# Patient Record
Sex: Male | Born: 1945 | Race: White | Hispanic: No | Marital: Married | Smoking: Former smoker
Health system: Southern US, Community
[De-identification: ages and names within clinical notes are randomized; demographics above are authoritative.]

## PROBLEM LIST (undated history)

## (undated) DIAGNOSIS — I371 Nonrheumatic pulmonary valve insufficiency: Secondary | ICD-10-CM

## (undated) DIAGNOSIS — Z951 Presence of aortocoronary bypass graft: Secondary | ICD-10-CM

## (undated) DIAGNOSIS — H02409 Unspecified ptosis of unspecified eyelid: Secondary | ICD-10-CM

## (undated) DIAGNOSIS — Z972 Presence of dental prosthetic device (complete) (partial): Secondary | ICD-10-CM

## (undated) DIAGNOSIS — N189 Chronic kidney disease, unspecified: Secondary | ICD-10-CM

## (undated) DIAGNOSIS — E119 Type 2 diabetes mellitus without complications: Secondary | ICD-10-CM

## (undated) DIAGNOSIS — IMO0001 Reserved for inherently not codable concepts without codable children: Secondary | ICD-10-CM

## (undated) DIAGNOSIS — I4891 Unspecified atrial fibrillation: Secondary | ICD-10-CM

## (undated) DIAGNOSIS — M0230 Reiter's disease, unspecified site: Secondary | ICD-10-CM

## (undated) DIAGNOSIS — I480 Paroxysmal atrial fibrillation: Secondary | ICD-10-CM

## (undated) DIAGNOSIS — I4510 Unspecified right bundle-branch block: Secondary | ICD-10-CM

## (undated) DIAGNOSIS — J189 Pneumonia, unspecified organism: Secondary | ICD-10-CM

## (undated) DIAGNOSIS — Z9289 Personal history of other medical treatment: Secondary | ICD-10-CM

## (undated) DIAGNOSIS — I071 Rheumatic tricuspid insufficiency: Secondary | ICD-10-CM

## (undated) DIAGNOSIS — K219 Gastro-esophageal reflux disease without esophagitis: Secondary | ICD-10-CM

## (undated) DIAGNOSIS — G4730 Sleep apnea, unspecified: Secondary | ICD-10-CM

## (undated) DIAGNOSIS — I10 Essential (primary) hypertension: Secondary | ICD-10-CM

## (undated) DIAGNOSIS — J449 Chronic obstructive pulmonary disease, unspecified: Secondary | ICD-10-CM

## (undated) DIAGNOSIS — I2510 Atherosclerotic heart disease of native coronary artery without angina pectoris: Secondary | ICD-10-CM

## (undated) DIAGNOSIS — E7800 Pure hypercholesterolemia, unspecified: Secondary | ICD-10-CM

## (undated) DIAGNOSIS — Z87442 Personal history of urinary calculi: Secondary | ICD-10-CM

## (undated) DIAGNOSIS — I499 Cardiac arrhythmia, unspecified: Secondary | ICD-10-CM

## (undated) DIAGNOSIS — G2581 Restless legs syndrome: Secondary | ICD-10-CM

## (undated) DIAGNOSIS — I340 Nonrheumatic mitral (valve) insufficiency: Secondary | ICD-10-CM

## (undated) DIAGNOSIS — G629 Polyneuropathy, unspecified: Secondary | ICD-10-CM

## (undated) HISTORY — PX: CHOLECYSTECTOMY: SHX55

## (undated) HISTORY — PX: CARDIAC SURGERY: SHX584

## (undated) HISTORY — PX: TOE SURGERY: SHX1073

## (undated) HISTORY — PX: OTHER SURGICAL HISTORY: SHX169

## (undated) HISTORY — PX: CARDIAC CATHETERIZATION: SHX172

## (undated) HISTORY — PX: HERNIA REPAIR: SHX51

## (undated) HISTORY — PX: CORONARY ARTERY BYPASS GRAFT: SHX141

---

## 2001-11-08 ENCOUNTER — Emergency Department (HOSPITAL_COMMUNITY): Admission: EM | Admit: 2001-11-08 | Discharge: 2001-11-08 | Payer: Self-pay | Admitting: Emergency Medicine

## 2004-01-30 HISTORY — PX: CORONARY ARTERY BYPASS GRAFT: SHX141

## 2004-12-11 ENCOUNTER — Ambulatory Visit: Payer: Self-pay | Admitting: Internal Medicine

## 2005-01-05 ENCOUNTER — Ambulatory Visit: Payer: Self-pay | Admitting: Internal Medicine

## 2005-01-05 ENCOUNTER — Inpatient Hospital Stay (HOSPITAL_COMMUNITY): Admission: AD | Admit: 2005-01-05 | Discharge: 2005-01-11 | Payer: Self-pay | Admitting: Surgery

## 2005-02-13 ENCOUNTER — Encounter: Payer: Self-pay | Admitting: Internal Medicine

## 2005-03-01 ENCOUNTER — Encounter: Payer: Self-pay | Admitting: Internal Medicine

## 2005-03-29 ENCOUNTER — Encounter: Payer: Self-pay | Admitting: Internal Medicine

## 2006-06-03 ENCOUNTER — Ambulatory Visit: Payer: Self-pay | Admitting: Unknown Physician Specialty

## 2008-08-19 ENCOUNTER — Ambulatory Visit: Payer: Self-pay | Admitting: Internal Medicine

## 2008-08-23 ENCOUNTER — Ambulatory Visit: Payer: Self-pay | Admitting: Internal Medicine

## 2008-08-25 ENCOUNTER — Ambulatory Visit: Payer: Self-pay | Admitting: Internal Medicine

## 2008-09-02 ENCOUNTER — Ambulatory Visit: Payer: Self-pay | Admitting: Internal Medicine

## 2009-10-11 ENCOUNTER — Encounter: Payer: Self-pay | Admitting: Internal Medicine

## 2009-10-29 ENCOUNTER — Encounter: Payer: Self-pay | Admitting: Internal Medicine

## 2009-11-25 ENCOUNTER — Inpatient Hospital Stay: Payer: Self-pay | Admitting: Internal Medicine

## 2009-11-29 ENCOUNTER — Encounter: Payer: Self-pay | Admitting: Internal Medicine

## 2009-12-29 ENCOUNTER — Encounter: Payer: Self-pay | Admitting: Internal Medicine

## 2010-01-29 ENCOUNTER — Encounter: Payer: Self-pay | Admitting: Internal Medicine

## 2010-03-01 ENCOUNTER — Encounter: Payer: Self-pay | Admitting: Internal Medicine

## 2010-09-20 ENCOUNTER — Ambulatory Visit: Payer: Self-pay | Admitting: Internal Medicine

## 2011-03-01 ENCOUNTER — Ambulatory Visit: Payer: Self-pay | Admitting: Urology

## 2011-11-12 ENCOUNTER — Ambulatory Visit: Payer: Self-pay | Admitting: Unknown Physician Specialty

## 2012-07-15 ENCOUNTER — Ambulatory Visit: Payer: Self-pay | Admitting: Specialist

## 2012-07-26 ENCOUNTER — Emergency Department: Payer: Self-pay | Admitting: Emergency Medicine

## 2012-07-26 LAB — COMPREHENSIVE METABOLIC PANEL
Albumin: 4.5 g/dL (ref 3.4–5.0)
Bilirubin,Total: 0.6 mg/dL (ref 0.2–1.0)
Calcium, Total: 8.8 mg/dL (ref 8.5–10.1)
Chloride: 104 mmol/L (ref 98–107)
Creatinine: 1 mg/dL (ref 0.60–1.30)
EGFR (Non-African Amer.): >60
SGPT (ALT): 56 U/L (ref 12–78)
Sodium: 138 mmol/L (ref 136–145)

## 2012-07-26 LAB — URINALYSIS, COMPLETE
Glucose,UR: NEGATIVE mg/dL (ref 0–75)
Leukocyte Esterase: NEGATIVE
Nitrite: NEGATIVE
Ph: 5 (ref 4.5–8.0)
Specific Gravity: 1.02 (ref 1.003–1.030)
Squamous Epithelial: NONE SEEN
WBC UR: 2 /HPF (ref 0–5)

## 2012-07-26 LAB — CBC
MCH: 32.9 pg (ref 26.0–34.0)
Platelet: 132 10*3/uL — ABNORMAL LOW (ref 150–440)

## 2012-07-26 IMAGING — CR DG ABDOMEN 1V
1 series · 2 of 2 positions shown · non-contrast
Comparison: none

REASON FOR EXAM: side pain
COMMENTS:

[Series 1: t abdomen supine · 0.14mm/px · 2 of 2 slices shown]
[im 1/2]
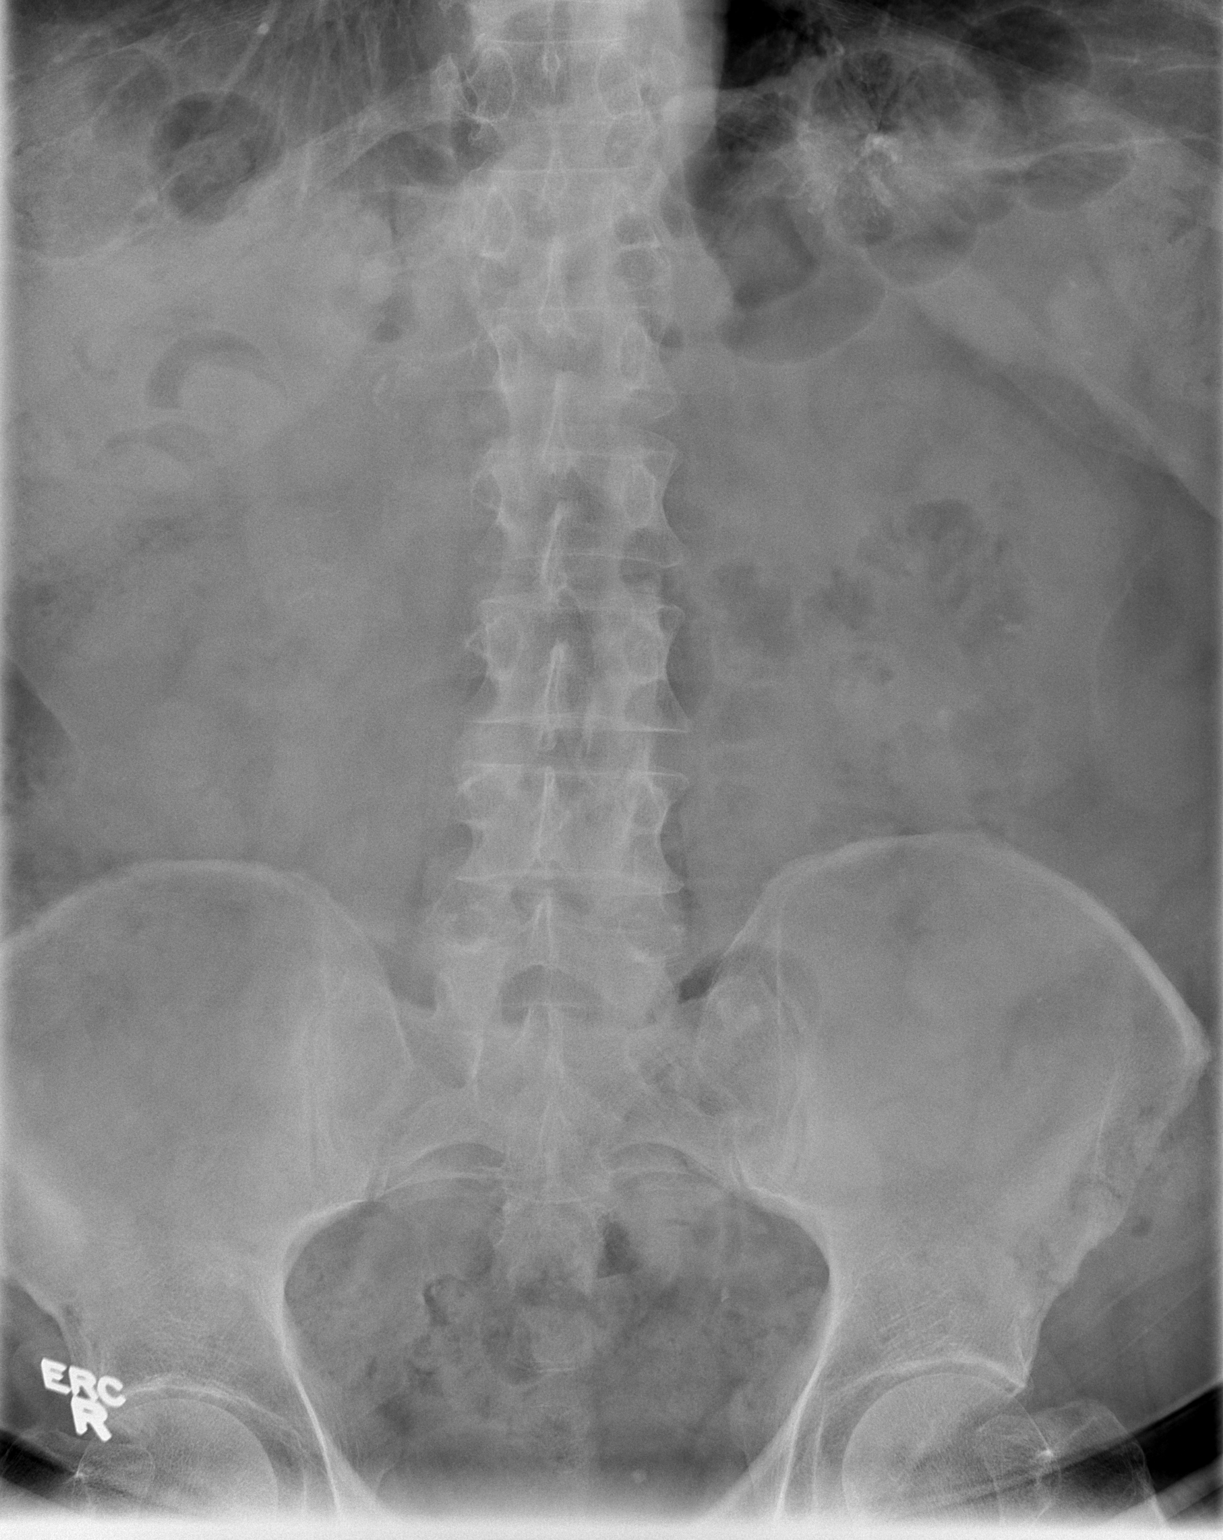
[im 2/2]
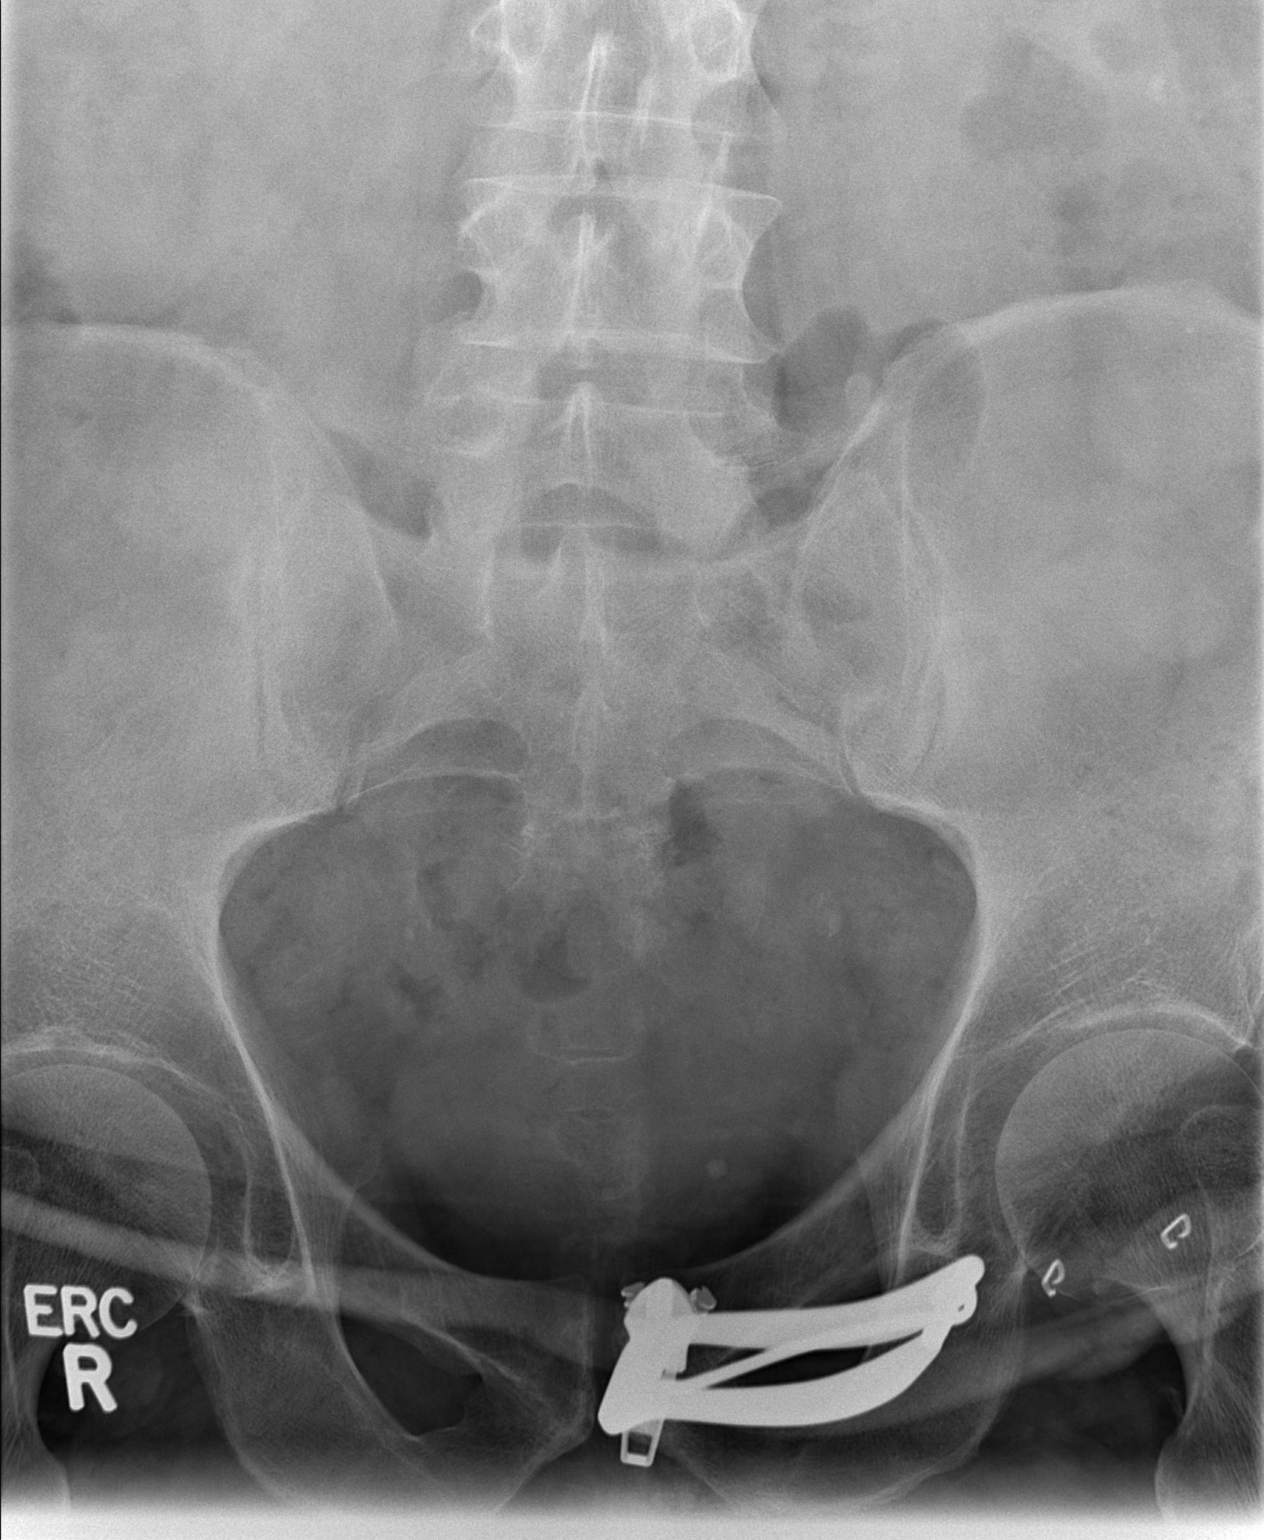

[2 of 2 positions shown; findings below may reference images not displayed]

PROCEDURE:     DXR - DXR KIDNEY URETER BLADDER  - [DATE]  [DATE]

RESULT:     The bowel gas pattern is within the limits of normal. There are
faint calcifications which project over both kidneys. The lumbar spine is
normal in appearance where visualized. The bowel gas pattern is within the
limits of normal.
IMPRESSION: There are faint calcifications which project over the
kidneys bilaterally which may reflect stones.

[REDACTED]

## 2012-12-30 ENCOUNTER — Encounter: Payer: Self-pay | Admitting: Internal Medicine

## 2013-01-29 ENCOUNTER — Encounter: Payer: Self-pay | Admitting: Internal Medicine

## 2013-03-01 ENCOUNTER — Encounter: Payer: Self-pay | Admitting: Internal Medicine

## 2013-03-29 ENCOUNTER — Encounter: Payer: Self-pay | Admitting: Internal Medicine

## 2013-04-29 ENCOUNTER — Encounter: Payer: Self-pay | Admitting: Internal Medicine

## 2013-05-29 ENCOUNTER — Encounter: Payer: Self-pay | Admitting: Internal Medicine

## 2013-06-29 ENCOUNTER — Encounter: Payer: Self-pay | Admitting: Internal Medicine

## 2013-12-27 ENCOUNTER — Emergency Department: Payer: Self-pay | Admitting: Emergency Medicine

## 2013-12-30 ENCOUNTER — Ambulatory Visit: Payer: Self-pay | Admitting: Nurse Practitioner

## 2013-12-31 DIAGNOSIS — G4733 Obstructive sleep apnea (adult) (pediatric): Secondary | ICD-10-CM | POA: Insufficient documentation

## 2013-12-31 DIAGNOSIS — I480 Paroxysmal atrial fibrillation: Secondary | ICD-10-CM | POA: Insufficient documentation

## 2013-12-31 DIAGNOSIS — I10 Essential (primary) hypertension: Secondary | ICD-10-CM | POA: Insufficient documentation

## 2013-12-31 DIAGNOSIS — E782 Mixed hyperlipidemia: Secondary | ICD-10-CM | POA: Insufficient documentation

## 2014-01-25 ENCOUNTER — Inpatient Hospital Stay: Payer: Self-pay | Admitting: Surgery

## 2014-01-25 LAB — URINALYSIS, COMPLETE
Bacteria: NONE SEEN
Bilirubin,UR: NEGATIVE
KETONE: NEGATIVE
LEUKOCYTE ESTERASE: NEGATIVE
NITRITE: NEGATIVE
Ph: 6 (ref 4.5–8.0)
Protein: NEGATIVE
RBC,UR: 165 /HPF (ref 0–5)
Specific Gravity: 1.016 (ref 1.003–1.030)
Squamous Epithelial: NONE SEEN
WBC UR: 2 /HPF (ref 0–5)

## 2014-01-25 LAB — COMPREHENSIVE METABOLIC PANEL
ANION GAP: 9 (ref 7–16)
Albumin: 4 g/dL (ref 3.4–5.0)
Alkaline Phosphatase: 113 U/L
BUN: 17 mg/dL (ref 7–18)
Bilirubin,Total: 0.6 mg/dL (ref 0.2–1.0)
CO2: 25 mmol/L (ref 21–32)
Calcium, Total: 8.4 mg/dL — ABNORMAL LOW (ref 8.5–10.1)
Chloride: 103 mmol/L (ref 98–107)
Creatinine: 0.87 mg/dL (ref 0.60–1.30)
EGFR (African American): >60
EGFR (Non-African Amer.): >60
Glucose: 224 mg/dL — ABNORMAL HIGH (ref 65–99)
OSMOLALITY: 282 (ref 275–301)
Potassium: 4.2 mmol/L (ref 3.5–5.1)
SGOT(AST): 46 U/L — ABNORMAL HIGH (ref 15–37)
SGPT (ALT): 34 U/L
Sodium: 137 mmol/L (ref 136–145)
Total Protein: 7.2 g/dL (ref 6.4–8.2)

## 2014-01-25 LAB — CBC WITH DIFFERENTIAL/PLATELET
Basophil #: 0.1 10*3/uL (ref 0.0–0.1)
Basophil %: 0.7 %
EOS ABS: 0 10*3/uL (ref 0.0–0.7)
EOS PCT: 0.5 %
HCT: 40 % (ref 40.0–52.0)
HGB: 13.8 g/dL (ref 13.0–18.0)
LYMPHS ABS: 1.1 10*3/uL (ref 1.0–3.6)
LYMPHS PCT: 13.4 %
MCH: 31.9 pg (ref 26.0–34.0)
MCHC: 34.4 g/dL (ref 32.0–36.0)
MCV: 93 fL (ref 80–100)
Monocyte #: 0.5 x10 3/mm (ref 0.2–1.0)
Monocyte %: 6.3 %
NEUTROS ABS: 6.4 10*3/uL (ref 1.4–6.5)
Neutrophil %: 79.1 %
Platelet: 154 10*3/uL (ref 150–440)
RBC: 4.31 10*6/uL — AB (ref 4.40–5.90)
RDW: 13.3 % (ref 11.5–14.5)
WBC: 8.1 10*3/uL (ref 3.8–10.6)

## 2014-01-25 LAB — LIPASE, BLOOD: LIPASE: 94 U/L (ref 73–393)

## 2014-01-28 ENCOUNTER — Ambulatory Visit: Payer: Self-pay | Admitting: Surgery

## 2014-01-28 DIAGNOSIS — I4891 Unspecified atrial fibrillation: Secondary | ICD-10-CM

## 2014-01-28 LAB — POTASSIUM: POTASSIUM: 4.7 mmol/L (ref 3.5–5.1)

## 2014-01-28 LAB — MAGNESIUM: Magnesium: 1.8 mg/dL

## 2014-01-29 LAB — CBC WITH DIFFERENTIAL/PLATELET
Basophil #: 0 x10 3/mm 3
Basophil %: 0.5 %
Eosinophil #: 0 x10 3/mm 3
Eosinophil %: 0.6 %
HCT: 33.5 % — ABNORMAL LOW
HGB: 11.8 g/dL — ABNORMAL LOW
Lymphocyte %: 6.1 %
Lymphs Abs: 0.5 x10 3/mm 3 — ABNORMAL LOW
MCH: 31.9 pg
MCHC: 35.2 g/dL
MCV: 91 fL
Monocyte #: 0.8 "x10 3/mm "
Monocyte %: 9.6 %
Neutrophil #: 6.5 x10 3/mm 3
Neutrophil %: 83.2 %
Platelet: 143 x10 3/mm 3 — ABNORMAL LOW
RBC: 3.7 x10 6/mm 3 — ABNORMAL LOW
RDW: 13.4 %
WBC: 7.8 x10 3/mm 3

## 2014-05-22 NOTE — Consult Note (Signed)
PATIENT NAME:  Zachary Guerrero, Zachary Guerrero MR#:  [id number] DATE OF BIRTH:  12-May-1945  DATE OF CONSULTATION:  01/25/2014  REFERRING PHYSICIAN:   CONSULTING PHYSICIAN:  Refoel Palladino A. Allena Katz, MD  PRIMARY CARE PHYSICIAN:  Dr. Aram Beecham.   PRIMARY CARDIOLOGIST:  Dr. [name].   REFERRING PHYSICIAN: Dr. Juliann Pulse.   CONSULTING PHYSICIAN:  Dr. Enedina Finner.    REASON FOR CONSULTATION:  Medical management, preoperative clearance for possible laparoscopic cholecystectomy. The patient has history of CAD.   HOSPITAL COURSE: Zachary Guerrero is a 69 year old obese Caucasian gentleman with past medical history of diabetes, COPD, hypercholesterolemia, history of CAD status post cardiac bypass in the remote past, along with history of chronic atrial fibrillation on Pradaxa for several years. He comes to the Emergency Room after he started having abdominal pain and not feeling well. Evaluation in the Emergency Room showed the patient has multiple gallstones. The patient is admitted on the surgical service, internal medicine was consulted for preoperative clearance. The patient denies any chest pain. Does have some chronic shortness of breath, however does not use oxygen. Denies any anginal chest pain. He was recently seen by Dr. [name]  and was cleared for his left shoulder fracture surgery in case it was needed. This happened the last week of November 2015.   In the Emergency Room the patient is in sinus rhythm. EKG shows normal sinus rhythm with right bundle branch block.   PAST MEDICAL HISTORY:  1.  Hypercholesterolemia.  2.  COPD.   3.  Morbid obesity with obstructive sleep apnea on CPAP.  4.  Atrial fibrillation, chronic on Pradaxa.   5.  Hypertension.  6.  Coronary artery disease status post CABG.  7.  Right wrist surgery.  8.  Right ankle surgery.  9.  CABG in the remote past.   ALLERGIES: No known drug allergies.   MEDICATIONS:  1. Zofran ODT 1 tablet every 8 hours as needed.  2. Tikosyn 125 mcg 3  tablets 2 times a day.  3. Symbicort 150/4.5 two puffs b.i.d.  4. Spiriva 18 micrograms inhalation daily.  5. ProAir HFA 2 puffs 4 times a day.  6. Pradaxa 150 mg b.i.d.  7. Percocet 5/325 one to two q. 4 p.r.n.  8. Omeprazole 20 mg daily.  9. Metoprolol 25 mg p.o. daily.  10. Metformin 1000 mg b.i.d.  11. Magnesium 400 mg b.i.d.  12. Lovastatin 40 mg daily at bedtime.  13. Linagliptin 5 mg daily.  14. Lantus 10 units subcutaneous daily.  15. Glimepiride 2 mg 1 tablet p.o. daily.  16. Augmentin 1 tablet b.i.d.    REVIEW OF SYSTEMS:   CONSTITUTIONAL: No fever, fatigue, weakness.  EYES: No blurred or double vision, glaucoma, or cataract.  EARS, NOSE, THROAT: No tinnitus, ear pain, hearing loss.  RESPIRATORY: No cough, wheeze, hemoptysis. Positive for on and off shortness of breath with exertion.  CARDIOVASCULAR: No chest pain, orthopnea. Positive for chronic atrial fibrillation currently in sinus rhythm. No palpitations.  GASTROINTESTINAL: No nausea, vomiting, diarrhea. Positive for abdominal pain, right upper quadrant and possible GERD.  GENITOURINARY:  No dysuria, hematuria, frequency.  ENDOCRINE: No polyuria, nocturia, or thyroid problems.  HEMATOLOGY: No anemia, easy bruising.  SKIN: No acne or rash.  MUSCULOSKELETAL: Positive for arthritis. No gout or swelling.  NEUROLOGIC:  No CVA, TIA, or migraines.  PSYCHIATRIC: No anxiety or depression.   All other systems reviewed and negative.   PHYSICAL EXAMINATION:  GENERAL: The patient is awake, alert, oriented x3, not in acute distress.  Morbidly obese.   VITAL SIGNS: Afebrile. Pulse is 80, regular rhythm, blood pressure is 147/70, saturations are 95% on room air.  HEENT: Atraumatic, normocephalic. Pupils PERRLA.  EOM intact. Oral mucosa is moist.  NECK: Supple. No JVD. No carotid bruit.  LUNGS: Clear to auscultation bilaterally. No rales, rhonchi, respiratory distress, or labored breathing.  CARDIOVASCULAR: Both the heart sounds  are normal. No murmur heard. PMI not lateralized. Chest nontender.  EXTREMITIES: Good pedal pulses, good femoral pulses. 2 + pitting edema.  ABDOMEN:  Obese, soft. There is some tenderness present in the right flank and right upper quadrant. No guarding or rigidity. No mass felt.  NEUROLOGIC: Grossly intact cranial nerves II through XII. No motor or sensory deficit. SKIN: Warm and dry.  PSYCHIATRIC: The patient is awake, alert, oriented x 3.   LABORATORY DATA: EKG shows right bundle branch block with normal sinus rhythm.   UA negative for UTI.    Ultrasound of the abdomen shows distended gallbladder containing layering of mildly echogenic debris without posterior shadowing, debris is mobile, most likely consistent with gallbladder sludge or tiny stones, no evidence of cholecystitis.    White count is 8.3, H and H is 13.8 and 40.3. BUN is 17, creatinine is 0.8, sodium is 137, potassium is 4.2. Bilirubin is 0.6. Alkaline phosphatase 113, SGOT is 46, albumin is 4.0.  Lipase is 894.    EKG shows normal sinus rhythm with right bundle branch block.   ASSESSMENT: A 69 year old, Zachary Guerrero, with history of chronic atrial fibrillation, coronary artery disease, diabetes, comes in with:  1.  Abdominal pain right upper quadrant, suspected due to gallstones. The patient was seen by Dr. Juliann PulseLundquist and recommended internal medicine consult for possible preoperative for possible removal of gallbladder. The patient is at intermediate risk given his multiple medical problems including cardiac history, although he is not actively experiencing any issues from his cardiac standpoint. He is okay for surgery at this time.  2. History of chronic atrial fibrillation on Peridex. The patient will hold off Pradaxa. His EKG shows normal sinus rhythm with right bundle branch block. We will hold off on IV heparin drip as well. Pradaxa should be resumed after surgery when clinically feasible per surgery.  3.  History of  coronary artery disease. We will hold off on aspirin, however continue the rest of his cardiac medications.  4.  Type 2 diabetes. Continue home medications including insulin and p.o. diabetic medications. 5.  Deep vein thrombosis prophylaxis. Heparin subcutaneous t.i.d.  6.  Hypertension. Continue metoprolol.  7.  Chronic obstructive pulmonary disease. Continue his nebulizers and home inhalers.  8.  Obstructive sleep apnea. Continue CPAP.   Thank you for the consult. We will follow while the patient is in house.   TIME SPENT: 50 minutes.    ____________________________ Wylie HailSona A. Allena KatzPatel, MD sap:bu D: 01/25/2014 15:47:40 ET T: 01/25/2014 16:23:52 ET JOB#: [id number]  cc: Aadhira Heffernan A. Allena KatzPatel, MD, <Dictator> Duane LopeJeffrey D. Judithann SheenSparks, MD Lamar BlinksBruce J. Kowalski, MD Si Raiderhristopher A. Juliann PulseLundquist, MD  Willow OraSONA A Daanish Copes MD ELECTRONICALLY SIGNED 01/26/2014 18:42

## 2014-05-24 LAB — SURGICAL PATHOLOGY

## 2014-05-26 NOTE — Consult Note (Signed)
PATIENT NAME:  Zachary Guerrero, Ramesh R MR#:  [id number] DATE OF BIRTH:  01-30-1946  DATE OF CONSULTATION:  01/25/2014  REFERRING PHYSICIAN:   CONSULTING PHYSICIAN:  Cristal Deerhristopher A. Jayvien Rowlette, MD  REASON FOR CONSULTATION: Right upper quadrant pain; small gallstones.   HISTORY OF PRESENT ILLNESS: Mr. Zachary Guerrero is a pleasant 69 year old male with history of recurrent gallstone disease. He presents with right upper quadrant pain after eating fatty foods. It is in his right flank; currently not tender. He did have some pain after trying to resume p.o. and had an ultrasound which shows gallstones, but says that his pain is similar to gallstone attacks that he has had in the past, and very many of them. In addition, he had a urinalysis which was positive for 2+ red blood cells.   PAST MEDICAL HISTORY: History of recurrent kidney stones, coronary artery disease, diabetes, COPD, hypercholesterolemia, AFib hypertension, coronary artery bypass graft, history of a right arm fracture, wrist surgery right, ankle surgery right, cardiac surgery.   MEDICATIONS: Tikosyn 125 mcg 3 tablets t.i.d., Symbicort 2 puffs b.i.d., Spiriva 1 daily, ProAir HFA 2 puffs q.i.d., Pradaxa 150 b.i.d., Percocet 1 tablet p.o. q. 6 hours p.r.n. pain. Omeprazole 20 mg p.o. daily, metoprolol 25 p.o. daily, metformin 1000 mg p.o. b.i.d. magnesium, lovastatin 1 tablet p.o. daily, linagliptin 5 mg p.o. daily, Lantus 10 units subcutaneous at bedtime, glimepiride  2 p.o. daily.   ALLERGIES: No known drug allergies.   FAMILY HISTORY: Unremarkable.   REVIEW OF SYSTEMS: A 12-point review of systems was  obtained. Pertinent positives and negatives as above.   PHYSICAL EXAMINATION:  VITAL SIGNS: Temperature 97.5, pulse 86, blood pressure 144/75, respirations 17.  GENERAL: No acute distress. Alert and oriented x 3.  HEAD: Normocephalic, atraumatic.  EYES: No scleral icterus. No conjunctivitis.  FACE: No evidence of facial trauma. Normal external nose.  Normal external ears.  CHEST: Lungs clear to auscultation. Moving air well.  HEART: Regular rate and rhythm. No murmurs, rubs, or gallops.  ABDOMEN: Soft, nondistended, nontender.  EXTREMITIES: Moves all extremities well. Strength 5/5.   LABORATORY DATA: White cell count of 8.1. LFTs otherwise unremarkable. Ultrasound shows small stones versus sludge. No thickening of gallbladder wall. No pericholecystic fluid. Common bile duct 4.6.   ASSESSMENT AND PLAN: Mr. Zachary Guerrero is a pleasant 69 year old with a history of kidney stones with right upper quadrant flank pain. Does have gallstones; however, does not have signs or symptoms of cholecystitis. Does have blood in his urine. Would entertain kidney stones versus symptomatic cholelithiasis versus  peptic ulcer disease. No need for emergent surgical intervention at this time. Have offered cholecystectomy, but have  offered waiting as well and we would prefer to do that. He has my contact information, in case his pain persists, so we can talk about cholecystectomy. We will continue to follow.    ____________________________ Si Raiderhristopher A. Emmett Arntz, MD cal:MT D: 01/25/2014 12:19:24 ET T: 01/25/2014 12:44:26 ET JOB#: [id number]  cc: Cristal Deerhristopher A. Yomayra Tate, MD, <Dictator> Jarvis NewcomerHRISTOPHER A Fran Mcree MD ELECTRONICALLY SIGNED 02/09/2014 19:46

## 2014-05-30 NOTE — Op Note (Signed)
PATIENT NAME:  Zachary Guerrero, Ossie R MR#:  [id number] DATE OF BIRTH:  1945/03/29  DATE OF PROCEDURE:  01/28/2014  PREOPERATIVE DIAGNOSIS: Acute cholecystitis.   POSTOPERATIVE DIAGNOSIS: Acute cholecystitis.   PROCEDURE PERFORMED: Laparoscopic cholecystectomy.   SURGEON: Lacy Sofia A. Andriy Sherk, M.D.   ANESTHESIA: General.   ESTIMATED BLOOD LOSS: 100 mL.   COMPLICATIONS: None.   SPECIMEN: Gallbladder.   INDICATION FOR SURGERY: Mr. [name] is a pleasant gentleman with a history of right upper quadrant pain, who I had seen earlier in the week. He was on a blood thinning medication and had an ultrasound which was suspicious for acute cholecystitis. I had initially advised him to stay but he wanted to go home and the decision was made for outpatient laparoscopic cholecystectomy.   DETAILS OF THE PROCEDURE AS FOLLOWS: Informed consent was obtained. Mr. [name] was brought to the operating room suite. He was induced, endotracheal tube was placed. General anesthesia was administered. His abdomen was prepped and draped in standard surgical fashion. A timeout was then performed correctly identifying the patient name, operative site and procedure to be performed. A supraumbilical incision was made. It was deepened down to the fascia. The fascia was incised. The peritoneum was entered. Two stay sutures were placed through the fasciotomy. A Hasson trocar was placed in the abdomen. The abdomen was insufflated. An 11 mm epigastric and 2 right subcostal trocars were placed. The gallbladder was lifted over the dome of the liver. It was extremely inflamed. There were a lot of adhesions to it. These were peeled off with significant bleeding. The cystic duct and the cystic artery were dissected out. The critical view was obtained. The duct and artery were clipped, although was taken off the gallbladder fossa and brought out through an Endo Catch bag. Hemostasis was obtained, but a drain was placed due to significant  blood loss and the fact that he was on anticoagulation.   The abdomen was then desufflated. The supraumbilical fascia was closed with figure-of-eight 0 Vicryl. The skin was then closed with 4-0 Monocryl deep dermal sutures. Steri-Strips, Telfa gauze and Tegaderm were placed over the wounds. The drain was sutured in place with a 3-0 nylon suture. Steri-Strips, Telfa gauze and Tegaderm were used to complete the dressing. The patient was then awoken, extubated and brought to the postanesthesia care unit. There were no immediate complications. Needle, sponge, and instrument counts were correct at the end of the procedure.     ____________________________ Si Raiderhristopher A. Tashari Schoenfelder, MD cal:JT D: 02/03/2014 08:51:00 ET T: 02/03/2014 13:23:23 ET JOB#: [id number]  cc: Cristal Deerhristopher A. Keval Nam, MD, <Dictator> Jarvis NewcomerHRISTOPHER A Jataya Wann MD ELECTRONICALLY SIGNED 02/09/2014 19:52

## 2014-10-31 ENCOUNTER — Emergency Department
Admission: EM | Admit: 2014-10-31 | Discharge: 2014-10-31 | Disposition: A | Discharge status: Home / Self Care | Payer: Medicare PPO | Attending: Emergency Medicine | Admitting: Emergency Medicine

## 2014-10-31 ENCOUNTER — Emergency Department: Payer: Medicare PPO

## 2014-10-31 DIAGNOSIS — N200 Calculus of kidney: Secondary | ICD-10-CM | POA: Diagnosis not present

## 2014-10-31 DIAGNOSIS — R109 Unspecified abdominal pain: Secondary | ICD-10-CM

## 2014-10-31 DIAGNOSIS — H938X1 Other specified disorders of right ear: Secondary | ICD-10-CM | POA: Insufficient documentation

## 2014-10-31 LAB — CBC
HEMATOCRIT: 43.9 % (ref 40.0–52.0)
HEMOGLOBIN: 15.6 g/dL (ref 13.0–18.0)
MCH: 33.2 pg (ref 26.0–34.0)
MCHC: 35.7 g/dL (ref 32.0–36.0)
MCV: 93.2 fL (ref 80.0–100.0)
Platelets: 136 10*3/uL — ABNORMAL LOW (ref 150–440)
RBC: 4.71 MIL/uL (ref 4.40–5.90)
RDW: 14.6 % — ABNORMAL HIGH (ref 11.5–14.5)
WBC: 6.8 10*3/uL (ref 3.8–10.6)

## 2014-10-31 LAB — COMPREHENSIVE METABOLIC PANEL
ALBUMIN: 4.8 g/dL (ref 3.5–5.0)
ALK PHOS: 85 U/L (ref 38–126)
ALT: 34 U/L (ref 17–63)
ANION GAP: 12 (ref 5–15)
AST: 51 U/L — ABNORMAL HIGH (ref 15–41)
BILIRUBIN TOTAL: 0.9 mg/dL (ref 0.3–1.2)
BUN: 22 mg/dL — ABNORMAL HIGH (ref 6–20)
CALCIUM: 8.5 mg/dL — AB (ref 8.9–10.3)
CO2: 22 mmol/L (ref 22–32)
Chloride: 100 mmol/L — ABNORMAL LOW (ref 101–111)
Creatinine, Ser: 0.91 mg/dL (ref 0.61–1.24)
Glucose, Bld: 228 mg/dL — ABNORMAL HIGH (ref 65–99)
POTASSIUM: 4.2 mmol/L (ref 3.5–5.1)
Sodium: 134 mmol/L — ABNORMAL LOW (ref 135–145)
TOTAL PROTEIN: 7.4 g/dL (ref 6.5–8.1)

## 2014-10-31 LAB — URINALYSIS COMPLETE WITH MICROSCOPIC (ARMC ONLY)
BACTERIA UA: NONE SEEN
BILIRUBIN URINE: NEGATIVE
GLUCOSE, UA: 50 mg/dL — AB
Ketones, ur: NEGATIVE mg/dL
LEUKOCYTES UA: NEGATIVE
NITRITE: NEGATIVE
PH: 5 (ref 5.0–8.0)
Protein, ur: 100 mg/dL — AB
SPECIFIC GRAVITY, URINE: 1.021 (ref 1.005–1.030)
SQUAMOUS EPITHELIAL / LPF: NONE SEEN

## 2014-10-31 MED ORDER — KETOROLAC TROMETHAMINE 30 MG/ML IJ SOLN
30.0000 mg | Freq: Once | INTRAMUSCULAR | Status: AC
  Administered 2014-10-31: 30 mg via INTRAVENOUS
  Filled 2014-10-31: qty 1

## 2014-10-31 MED ORDER — SODIUM CHLORIDE 0.9 % IV SOLN
1000.0000 mL | Freq: Once | INTRAVENOUS | Status: AC
  Administered 2014-10-31: 1000 mL via INTRAVENOUS

## 2014-10-31 MED ORDER — OXYCODONE-ACETAMINOPHEN 5-325 MG PO TABS: 1.0000 | ORAL_TABLET | Freq: Four times a day (QID) | ORAL | Status: DC | PRN

## 2014-10-31 MED ORDER — TAMSULOSIN HCL 0.4 MG PO CAPS: 0.4000 mg | ORAL_CAPSULE | Freq: Every day | ORAL | Status: DC

## 2014-10-31 MED ORDER — ONDANSETRON HCL 4 MG PO TABS: 4.0000 mg | ORAL_TABLET | Freq: Every day | ORAL | Status: DC | PRN

## 2014-10-31 MED ORDER — ONDANSETRON HCL 4 MG/2ML IJ SOLN
4.0000 mg | Freq: Once | INTRAMUSCULAR | Status: AC
  Administered 2014-10-31: 4 mg via INTRAVENOUS
  Filled 2014-10-31: qty 2

## 2014-10-31 NOTE — ED Provider Notes (Addendum)
[facility] Emergency Department Provider Note  ____________________________________________   I have reviewed the triage vital signs and the nursing notes.   HISTORY  Chief Complaint Back Pain    HPI Zachary Guerrero is a 69 y.o. male  with a remote history of CABG, pradaxa for atrial fibrillation, with a history of recurrent kidney stones in the past with no history of aneurysm that he knows of presents today complaining of sudden onset left flank pain which woke him up from sleep at 3:00 in the morning. He has had no chest pain or shortness of breath no vomiting. Patient states he was in significant discomfort to the right ear which time he was given Toradol his pain is now gone. This feels exactly like prior kidney stones. He has had no antecedent fever or chills or dysuria, he has no hematuria that he has noted, he has not had any melena bright red blood per rectum or diarrhea. He denies any numbness or weakness or radicular symptoms. He is comfortable at this time. He feels this is a kidney stone.     No past medical history on file.  There are no active problems to display for this patient.   No past surgical history on file.  No current outpatient prescriptions on file.  Allergies Review of patient's allergies indicates no known allergies.  No family history on file.  Social History Social History  Substance Use Topics  . Smoking status: Not on file  . Smokeless tobacco: Not on file  . Alcohol Use: Not on file    Review of Systems Constitutional: No fever/chills Eyes: No visual changes. ENT: No sore throat. No stiff neck no neck pain Cardiovascular: Denies chest pain. Respiratory: Denies shortness of breath. Gastrointestinal:   no vomiting.  No diarrhea.  No constipation. Genitourinary: Negative for dysuria. Musculoskeletal: Negative lower extremity swelling Skin: Negative for rash. Neurological: Negative for headaches, focal weakness  or numbness. 10-point ROS otherwise negative.  ____________________________________________   PHYSICAL EXAM:  VITAL SIGNS: ED Triage Vitals  Enc Vitals Group     BP 10/31/14 0600 99/87 mmHg     Pulse Rate 10/31/14 0600 113     Resp 10/31/14 0600 22     Temp 10/31/14 0644 97.5 F (36.4 C)     Temp Source 10/31/14 0644 Oral     SpO2 10/31/14 0600 95 %     Weight 10/31/14 0600 272 lb (123.378 kg)     Height 10/31/14 0600  (1.803 m)     Head Cir --      Peak Flow --      Pain Score 10/31/14 0600 8     Pain Loc --      Pain Edu? --      Excl. in GC? --     Constitutional: Alert and oriented. Well appearing and in no acute distress. Eyes: Conjunctivae are normal. PERRL. EOMI. Head: Atraumatic. Nose: No congestion/rhinnorhea. Mouth/Throat: Mucous membranes are moist.  Oropharynx non-erythematous. Neck: No stridor.   Nontender with no meningismus Cardiovascular: Normal rate, regular rhythm. Grossly normal heart sounds.  Good peripheral circulation. Respiratory: Normal respiratory effort.  No retractions. Lungs CTAB. Gastrointestinal: Soft and nontender. No distention. No guarding no rebound, deep palpation of the abdominal cavity temperature is no evidence of AAA but patient is morbidly obese Back:  There is no focal tenderness or step off there is no midline tenderness there are no lesions noted. there is mild left CVA tenderness Musculoskeletal: No  lower extremity tenderness. No joint effusions, no DVT signs strong distal pulses no edema Neurologic:  Normal speech and language. No gross focal neurologic deficits are appreciated.  Skin:  Skin is warm, dry and intact. No rash noted. Psychiatric: Mood and affect are normal. Speech and behavior are normal.  ____________________________________________   LABS (all labs ordered are listed, but only abnormal results are displayed)  Labs Reviewed  COMPREHENSIVE METABOLIC PANEL - Abnormal; Notable for the following:    Sodium  134 (*)    Chloride 100 (*)    Glucose, Bld 228 (*)    BUN 22 (*)    Calcium 8.5 (*)    AST 51 (*)    All other components within normal limits  CBC - Abnormal; Notable for the following:    RDW 14.6 (*)    Platelets 136 (*)    All other components within normal limits  URINALYSIS COMPLETEWITH MICROSCOPIC ([facility] ONLY)   ____________________________________________  EKG   ____________________________________________  RADIOLOGY   ____________________________________________   PROCEDURES  Procedure(s) performed: None  Critical Care performed: None  ____________________________________________   INITIAL IMPRESSION / ASSESSMENT AND PLAN / ED COURSE  Pertinent labs & imaging results that were available during my care of the patient were reviewed by me and considered in my medical decision making (see chart for details).  totally gentleman with multiple medical problems presents today complaining of what he believes is a kidney stone. He is pain-free after Toradol. The differential for this gentleman is quite broad and does include AAA, retroperitoneal bleed, among other pathologies. He has strong distal pulses. He is neurologically intact. Blood work is reassuring, patient has no pain or complaints at this time, his exam is reassuring, I have called over to the CT suite to see if we can expedite CT scanning. Given the location and character pain I have low suspicion this represents ACS.  ----------------------------------------- 8:24 AM on 10/31/2014 -----------------------------------------  Remains very comfortable fortunately this is a kidney stone and not other significant pathology we are waiting the patient's urine sample. I have given him a cup of coffee to help with this. He has no other complaints at this time. This is a can get his urine, we will discharge him if he remains well-appearing.  _________________________________________   FINAL CLINICAL IMPRESSION(S)  / ED DIAGNOSES  Final diagnoses:  Flank pain     Jeanmarie Plant, MD 10/31/14 0725  Jeanmarie Plant, MD 10/31/14 [phone]

## 2014-10-31 NOTE — ED Notes (Signed)
Strainer given to patient at discharge. E-signature pad not working.

## 2014-11-01 LAB — URINE CULTURE: Culture: NO GROWTH

## 2014-11-04 ENCOUNTER — Encounter: Payer: Self-pay | Admitting: *Deleted

## 2014-11-04 ENCOUNTER — Emergency Department
Admission: EM | Admit: 2014-11-04 | Discharge: 2014-11-04 | Disposition: A | Discharge status: Home / Self Care | Payer: Medicare PPO | Attending: Emergency Medicine | Admitting: Emergency Medicine

## 2014-11-04 DIAGNOSIS — Z79899 Other long term (current) drug therapy: Secondary | ICD-10-CM | POA: Diagnosis not present

## 2014-11-04 DIAGNOSIS — E119 Type 2 diabetes mellitus without complications: Secondary | ICD-10-CM | POA: Insufficient documentation

## 2014-11-04 DIAGNOSIS — N201 Calculus of ureter: Secondary | ICD-10-CM | POA: Insufficient documentation

## 2014-11-04 DIAGNOSIS — R109 Unspecified abdominal pain: Secondary | ICD-10-CM | POA: Diagnosis present

## 2014-11-04 DIAGNOSIS — Z794 Long term (current) use of insulin: Secondary | ICD-10-CM | POA: Insufficient documentation

## 2014-11-04 HISTORY — DX: Type 2 diabetes mellitus without complications: E11.9

## 2014-11-04 HISTORY — DX: Atherosclerotic heart disease of native coronary artery without angina pectoris: I25.10

## 2014-11-04 LAB — URINALYSIS COMPLETE WITH MICROSCOPIC (ARMC ONLY)
BILIRUBIN URINE: NEGATIVE
Glucose, UA: 50 mg/dL — AB
KETONES UR: NEGATIVE mg/dL
LEUKOCYTES UA: NEGATIVE
NITRITE: NEGATIVE
Protein, ur: 30 mg/dL — AB
SPECIFIC GRAVITY, URINE: 1.02 (ref 1.005–1.030)
Squamous Epithelial / LPF: NONE SEEN
pH: 5 (ref 5.0–8.0)

## 2014-11-04 MED ORDER — CEPHALEXIN 500 MG PO CAPS: 500.0000 mg | ORAL_CAPSULE | Freq: Two times a day (BID) | ORAL | Status: AC

## 2014-11-04 MED ORDER — CEPHALEXIN 500 MG PO CAPS
500.0000 mg | ORAL_CAPSULE | Freq: Once | ORAL | Status: AC
  Administered 2014-11-04: 500 mg via ORAL

## 2014-11-04 MED ORDER — CEPHALEXIN 500 MG PO CAPS
ORAL_CAPSULE | ORAL | Status: AC
  Filled 2014-11-04: qty 1

## 2014-11-04 MED ORDER — OXYCODONE-ACETAMINOPHEN 5-325 MG PO TABS
2.0000 | ORAL_TABLET | Freq: Once | ORAL | Status: AC
  Administered 2014-11-04: 2 via ORAL

## 2014-11-04 MED ORDER — OXYCODONE-ACETAMINOPHEN 5-325 MG PO TABS: 1.0000 | ORAL_TABLET | Freq: Four times a day (QID) | ORAL | Status: DC | PRN

## 2014-11-04 MED ORDER — OXYCODONE-ACETAMINOPHEN 5-325 MG PO TABS
ORAL_TABLET | ORAL | Status: AC
  Filled 2014-11-04: qty 2

## 2014-11-04 NOTE — ED Provider Notes (Signed)
[facility] Emergency Department Provider Note  Time seen: 8:51 PM  I have reviewed the triage vital signs and the nursing notes.   HISTORY  Chief Complaint Flank Pain    HPI Zachary Guerrero is a 69 y.o. male with a past medical history of diabetes, coronary artery disease, on Pradaxa, presents the emergency department with left flank pain. According to the patient he was seen on 10/31/14 and diagnosed with left ureterolithiasis. Patient was prescribed pain medication, continues to have pain so he came back to the emergency department for evaluation. Denies any fever, nausea, vomiting, diarrhea. He does state mild dysuria when urinating. Patient currently awaiting PCP appointment with Dr. Judithann Sheen. Describes flank pain as moderate, dull/aching pain.No modifying factors identified.     Past Medical History  Diagnosis Date  . Diabetes mellitus without complication (HCC)   . Coronary artery disease     There are no active problems to display for this patient.   Past Surgical History  Procedure Laterality Date  . Cardiac surgery    . Cholecystectomy      Current Outpatient Rx  Name  Route  Sig  Dispense  Refill  . ALPRAZolam (XANAX) 0.25 MG tablet   Oral   Take 0.25 mg by mouth at bedtime as needed. For sleep.         Marland Kitchen dofetilide (TIKOSYN) 125 MCG capsule   Oral   Take 375 mcg by mouth every 12 (twelve) hours.      3   . LANTUS 100 UNIT/ML injection   Subcutaneous   Inject 10 Units into the skin at bedtime.      0     Dispense as written.   . lovastatin (MEVACOR) 40 MG tablet   Oral   Take 40 mg by mouth at bedtime.      0   . magnesium oxide (MAG-OX) 400 (241.3 MG) MG tablet   Oral   Take 1 tablet by mouth 2 (two) times daily.      7   . metFORMIN (GLUCOPHAGE) 1000 MG tablet   Oral   Take 1,000 mg by mouth 2 (two) times daily.      1   . metoprolol succinate (TOPROL-XL) 50 MG 24 hr tablet   Oral   Take 50 mg by mouth  daily.         Marland Kitchen omeprazole (PRILOSEC) 20 MG capsule   Oral   Take 20 mg by mouth daily.      4   . ondansetron (ZOFRAN) 4 MG tablet   Oral   Take 1 tablet (4 mg total) by mouth daily as needed for nausea or vomiting.   10 tablet   0   . oxyCODONE-acetaminophen (ROXICET) 5-325 MG tablet   Oral   Take 1 tablet by mouth every 6 (six) hours as needed.   8 tablet   0   . PRADAXA 150 MG CAPS capsule   Oral   Take 150 mg by mouth 2 (two) times daily.           Dispense as written.   Marland Kitchen PROAIR HFA 108 (90 BASE) MCG/ACT inhaler   Inhalation   Inhale 2 puffs into the lungs every 6 (six) hours as needed. For shortness of breath and/or wheezing.           Dispense as written.   Marland Kitchen SPIRIVA HANDIHALER 18 MCG inhalation capsule   Inhalation   Place 18 mcg into inhaler and inhale daily.  5     Dispense as written.   . SYMBICORT 160-4.5 MCG/ACT inhaler   Inhalation   Inhale 2 puffs into the lungs 2 (two) times daily.      5     Dispense as written.   . tamsulosin (FLOMAX) 0.4 MG CAPS capsule   Oral   Take 1 capsule (0.4 mg total) by mouth daily.   10 capsule   0     Allergies Neomycin-polymyxin-gramicidin  History reviewed. No pertinent family history.  Social History Social History  Substance Use Topics  . Smoking status: Never Smoker   . Smokeless tobacco: None  . Alcohol Use: 1.2 oz/week    2 Glasses of wine per week    Review of Systems Constitutional: Negative for fever. Cardiovascular: Negative for chest pain. Respiratory: Negative for shortness of breath. Gastrointestinal: Positive for left flank pain. Negative for nausea, vomiting, diarrhea. Genitourinary: Mild dysuria. Musculoskeletal: Positive left flank/back pain Neurological: Negative for headache 10-point ROS otherwise negative.  ____________________________________________   PHYSICAL EXAM:  VITAL SIGNS: ED Triage Vitals  Enc Vitals Group     BP 11/04/14 1808 147/77 mmHg      Pulse Rate 11/04/14 1808 76     Resp 11/04/14 1808 16     Temp 11/04/14 1808 98.1 F (36.7 C)     Temp Source 11/04/14 1808 Oral     SpO2 11/04/14 1808 97 %     Weight 11/04/14 1808 272 lb (123.378 kg)     Height 11/04/14 1808  (1.803 m)     Head Cir --      Peak Flow --      Pain Score 11/04/14 1812 7     Pain Loc --      Pain Edu? --      Excl. in GC? --     Constitutional: Alert and oriented. Well appearing and in no distress. Eyes: Normal exam ENT   Head: Normocephalic and atraumatic   Mouth/Throat: Mucous membranes are moist. Cardiovascular: Normal rate, regular rhythm. No murmur Respiratory: Normal respiratory effort without tachypnea nor retractions. Breath sounds are clear  Gastrointestinal: Soft and nontender. No distention.   Musculoskeletal: Nontender with normal range of motion in all extremities.  Neurologic:  Normal speech and language. No gross focal neurologic deficits  Psychiatric: Mood and affect are normal. Speech and behavior are normal. ____________________________________________    INITIAL IMPRESSION / ASSESSMENT AND PLAN / ED COURSE  Pertinent labs & imaging results that were available during my care of the patient were reviewed by me and considered in my medical decision making (see chart for details).  Patient with continued left flank pain. States he ran out of his pain medication. Currently awaiting PCP appointment with Dr. Judithann Sheen. CT and 10/31/14 showed 4 mm left ureteral lithiasis. I discussed with the patient need to follow up with the urologist. Maryclare Labrador refill his pain medication, and cover with an antibiotic given his development of dysuria with white blood cells in his urine. Patient agreeable to plan. Patient will call the number provided for urology to obtain a follow-up appointment. Discussed return precautions for any worsening abdominal pain, nausea, vomiting, fever.  ____________________________________________   FINAL  CLINICAL IMPRESSION(S) / ED DIAGNOSES  Ureterolithiasis   [name], MD 11/04/14 2055

## 2014-11-04 NOTE — ED Notes (Signed)
Pt to ED from home with worsening left flank pain following kidney stone diagnosis on Sunday morning. Pt states "the pain is unbearable and I ran out of my pain meds and I still have not seen the stone pass in my strainer." Pt also states "i am worried about getting an infection" Pt AAOx3, vitals wnl at this time, no acute distress noted. Pt states pain 7/10.

## 2014-11-04 NOTE — Discharge Instructions (Signed)
Kidney Stones °Kidney stones (urolithiasis) are deposits that form inside your kidneys. The intense pain is caused by the stone moving through the urinary tract. When the stone moves, the ureter goes into spasm around the stone. The stone is usually passed in the urine.  °CAUSES  °· A disorder that makes certain neck glands produce too much parathyroid hormone (primary hyperparathyroidism). °· A buildup of uric acid crystals, similar to gout in your joints. °· Narrowing (stricture) of the ureter. °· A kidney obstruction present at birth (congenital obstruction). °· Previous surgery on the kidney or ureters. °· Numerous kidney infections. °SYMPTOMS  °· Feeling sick to your stomach (nauseous). °· Throwing up (vomiting). °· Blood in the urine (hematuria). °· Pain that usually spreads (radiates) to the groin. °· Frequency or urgency of urination. °DIAGNOSIS  °· Taking a history and physical exam. °· Blood or urine tests. °· CT scan. °· Occasionally, an examination of the inside of the urinary bladder (cystoscopy) is performed. °TREATMENT  °· Observation. °· Increasing your fluid intake. °· Extracorporeal shock wave lithotripsy--This is a noninvasive procedure that uses shock waves to break up kidney stones. °· Surgery may be needed if you have severe pain or persistent obstruction. There are various surgical procedures. Most of the procedures are performed with the use of small instruments. Only small incisions are needed to accommodate these instruments, so recovery time is minimized. °The size, location, and chemical composition are all important variables that will determine the proper choice of action for you. Talk to your health care provider to better understand your situation so that you will minimize the risk of injury to yourself and your kidney.  °HOME CARE INSTRUCTIONS  °· Drink enough water and fluids to keep your urine clear or pale yellow. This will help you to pass the stone or stone fragments. °· Strain  all urine through the provided strainer. Keep all particulate matter and stones for your health care provider to see. The stone causing the pain may be as small as a grain of salt. It is very important to use the strainer each and every time you pass your urine. The collection of your stone will allow your health care provider to analyze it and verify that a stone has actually passed. The stone analysis will often identify what you can do to reduce the incidence of recurrences. °· Only take over-the-counter or prescription medicines for pain, discomfort, or fever as directed by your health care provider. °· Keep all follow-up visits as told by your health care provider. This is important. °· Get follow-up X-rays if required. The absence of pain does not always mean that the stone has passed. It may have only stopped moving. If the urine remains completely obstructed, it can cause loss of kidney function or even complete destruction of the kidney. It is your responsibility to make sure X-rays and follow-ups are completed. Ultrasounds of the kidney can show blockages and the status of the kidney. Ultrasounds are not associated with any radiation and can be performed easily in a matter of minutes. °· Make changes to your daily diet as told by your health care provider. You may be told to: °¨ Limit the amount of salt that you eat. °¨ Eat 5 or more servings of fruits and vegetables each day. °¨ Limit the amount of meat, poultry, fish, and eggs that you eat. °· Collect a 24-hour urine sample as told by your health care provider. You may need to collect another urine sample every 6-12   months. °SEEK MEDICAL CARE IF: °· You experience pain that is progressive and unresponsive to any pain medicine you have been prescribed. °SEEK IMMEDIATE MEDICAL CARE IF:  °· Pain cannot be controlled with the prescribed medicine. °· You have a fever or shaking chills. °· The severity or intensity of pain increases over 18 hours and is not  relieved by pain medicine. °· You develop a new onset of abdominal pain. °· You feel faint or pass out. °· You are unable to urinate. °  °This information is not intended to replace advice given to you by your health care provider. Make sure you discuss any questions you have with your health care provider. °  °Document Released: 01/15/2005 Document Revised: 10/06/2014 Document Reviewed: 06/18/2012 °Elsevier Interactive Patient Education ©2016 [employer] ° °

## 2014-11-04 NOTE — ED Notes (Signed)
MD at bedside for eval.

## 2014-12-30 NOTE — Discharge Instructions (Signed)
INSTRUCTIONS FOLLOWING OCULOPLASTIC SURGERY °AMY M. FOWLER, MD ° °AFTER YOUR EYE SURGERY, THER ARE MANY THINGS THWIHC YOU, THE PATIENT, CAN DO TO ASSURE THE BEST POSSIBLE RESULT FROM YOUR OPERATION.  THIS SHEET SHOULD BE REFERRED TO WHENEVER QUESTIONS ARISE.  IF THERE ARE ANY QUESTIONS NOT ANSWERED HERE, DO NOT HESITATE TO CALL OUR OFFICE AT [phone] OR [phone].  THERE IS ALWAYS [facility] AVAILABLE TO CALL IF QUESTIONS OR PROBLEMS ARISE. ° °VISION: Your vision may be blurred and out of focus after surgery until you are able to stop using your ointment, swelling resolves and your eye(s) heal. This may take 1 to 2 weeks at the least.  If your vision becomes gradually more dim or dark, this is not normal and you need to call our office immediately. ° °EYE CARE: For the first 48 hours after surgery, use ice packs frequently - “20 minutes on, 20 minutes off” - to help reduce swelling and bruising.  Small bags of frozen peas or corn make good ice packs along with cloths soaked in ice water.  If you are wearing a patch or other type of dressing following surgery, keep this on for the amount of time specified by your doctor.  For the first week following surgery, you will need to treat your stitches with great care.  If is OK to shower, but take care to not allow soapy water to run into your eye(s) to help reduce changes of infection.  You may gently clean the eyelashes and around the eye(s) with cotton balls and sterile water, BUT DO NOT RUB THE STITCHES VIGOROUSLY.  Keeping your stitches moist with ointment will help promote healing with minimal scar formation. ° °ACTIVITY: When you leave the surgery center, you should go home, rest and be inactive.  The eye(s) may feel scratchy and keeping the eyes closed will allow for faster healing.  The first week following surgery, avoid straining (anything making the face turn red) or lifting over 20 pounds.  Additionally, avoid bending which causes your head to go below  your waist.  Using your eyes will NOT harm them, so feel free to read, watch television, use the computer, etc as desired.  Driving depends on each individual, so check with your doctor if you have questions about driving. ° °MEDICATIONS:  You will be given a prescription for an ointment to use 4 times a day on your stitches.  You can use the ointment in your eyes if they feel scratchy or irritated.  If you eyelid(s) don’t close completely when you sleep, put some ointment in your eyes before bedtime. ° °EMERGENCY: If you experience SEVERE EYE PAIN OR HEADACHE UNRELIEVED BY TYLENOL OR PERCOCET, NAUSEA OR VOMITING, WORSENING REDNESS, OR WORSENING VISION (ESPECIALLY VISION THAT WA INITIALLY BETTER) CALL [phone] OR [phone] DURING BUSINESS HOURS OR AFTER HOURS. ° °General Anesthesia, Adult, Care After °Refer to this sheet in the next few weeks. These instructions provide you with information on caring for yourself after your procedure. Your health care provider may also give you more specific instructions. Your treatment has been planned according to current medical practices, but problems sometimes occur. Call your health care provider if you have any problems or questions after your procedure. °WHAT TO EXPECT AFTER THE PROCEDURE °After the procedure, it is typical to experience: °· Sleepiness. °· Nausea and vomiting. °HOME CARE INSTRUCTIONS °· For the first 24 hours after general anesthesia: °¨ Have a responsible person with you. °¨ Do not drive a car. If you   are alone, do not take public transportation. °¨ Do not drink alcohol. °¨ Do not take medicine that has not been prescribed by your health care provider. °¨ Do not sign important papers or make important decisions. °¨ You may resume a normal diet and activities as directed by your health care provider. °· Change bandages (dressings) as directed. °· If you have questions or problems that seem related to general anesthesia, call the hospital and ask for  the anesthetist or anesthesiologist on call. °SEEK MEDICAL CARE IF: °· You have nausea and vomiting that continue the day after anesthesia. °· You develop a rash. °SEEK IMMEDIATE MEDICAL CARE IF:  °· You have difficulty breathing. °· You have chest pain. °· You have any allergic problems. °  °This information is not intended to replace advice given to you by your health care provider. Make sure you discuss any questions you have with your health care provider. °  °Document Released: 04/23/2000 Document Revised: 02/05/2014 Document Reviewed: 05/16/2011 °Elsevier Interactive Patient Education ©2016 [employer]vier Inc. ° °

## 2015-01-04 ENCOUNTER — Ambulatory Visit
Admission: RE | Admit: 2015-01-04 | Discharge: 2015-01-04 | Disposition: A | Discharge status: Home / Self Care | Payer: Medicare PPO | Source: Ambulatory Visit | Attending: Ophthalmology | Admitting: Ophthalmology

## 2015-01-04 ENCOUNTER — Ambulatory Visit: Payer: Medicare PPO | Admitting: Anesthesiology

## 2015-01-04 ENCOUNTER — Encounter
Admission: RE | Disposition: A | Discharge status: Home / Self Care | Payer: Self-pay | Source: Ambulatory Visit | Attending: Ophthalmology

## 2015-01-04 DIAGNOSIS — M0230 Reiter's disease, unspecified site: Secondary | ICD-10-CM | POA: Insufficient documentation

## 2015-01-04 DIAGNOSIS — H02831 Dermatochalasis of right upper eyelid: Secondary | ICD-10-CM | POA: Diagnosis not present

## 2015-01-04 DIAGNOSIS — J449 Chronic obstructive pulmonary disease, unspecified: Secondary | ICD-10-CM | POA: Insufficient documentation

## 2015-01-04 DIAGNOSIS — H02834 Dermatochalasis of left upper eyelid: Secondary | ICD-10-CM | POA: Diagnosis not present

## 2015-01-04 DIAGNOSIS — H02105 Unspecified ectropion of left lower eyelid: Secondary | ICD-10-CM | POA: Insufficient documentation

## 2015-01-04 DIAGNOSIS — H02403 Unspecified ptosis of bilateral eyelids: Secondary | ICD-10-CM | POA: Diagnosis not present

## 2015-01-04 DIAGNOSIS — H02102 Unspecified ectropion of right lower eyelid: Secondary | ICD-10-CM | POA: Diagnosis not present

## 2015-01-04 DIAGNOSIS — I10 Essential (primary) hypertension: Secondary | ICD-10-CM | POA: Insufficient documentation

## 2015-01-04 DIAGNOSIS — Z79899 Other long term (current) drug therapy: Secondary | ICD-10-CM | POA: Insufficient documentation

## 2015-01-04 DIAGNOSIS — G4730 Sleep apnea, unspecified: Secondary | ICD-10-CM | POA: Insufficient documentation

## 2015-01-04 DIAGNOSIS — I2510 Atherosclerotic heart disease of native coronary artery without angina pectoris: Secondary | ICD-10-CM | POA: Diagnosis not present

## 2015-01-04 DIAGNOSIS — E7800 Pure hypercholesterolemia, unspecified: Secondary | ICD-10-CM | POA: Diagnosis not present

## 2015-01-04 DIAGNOSIS — E119 Type 2 diabetes mellitus without complications: Secondary | ICD-10-CM | POA: Diagnosis not present

## 2015-01-04 DIAGNOSIS — I4891 Unspecified atrial fibrillation: Secondary | ICD-10-CM | POA: Diagnosis not present

## 2015-01-04 DIAGNOSIS — Z951 Presence of aortocoronary bypass graft: Secondary | ICD-10-CM | POA: Diagnosis not present

## 2015-01-04 DIAGNOSIS — Z794 Long term (current) use of insulin: Secondary | ICD-10-CM | POA: Insufficient documentation

## 2015-01-04 HISTORY — DX: Cardiac arrhythmia, unspecified: I49.9

## 2015-01-04 HISTORY — DX: Reserved for inherently not codable concepts without codable children: IMO0001

## 2015-01-04 HISTORY — DX: Unspecified ptosis of unspecified eyelid: H02.409

## 2015-01-04 HISTORY — PX: PTOSIS REPAIR: SHX6568

## 2015-01-04 HISTORY — PX: BROW LIFT: SHX178

## 2015-01-04 HISTORY — DX: Pure hypercholesterolemia, unspecified: E78.00

## 2015-01-04 HISTORY — DX: Pneumonia, unspecified organism: J18.9

## 2015-01-04 HISTORY — DX: Sleep apnea, unspecified: G47.30

## 2015-01-04 HISTORY — PX: ECTROPION REPAIR: SHX357

## 2015-01-04 HISTORY — DX: Reiter's disease, unspecified site: M02.30

## 2015-01-04 HISTORY — DX: Presence of dental prosthetic device (complete) (partial): Z97.2

## 2015-01-04 HISTORY — DX: Chronic obstructive pulmonary disease, unspecified: J44.9

## 2015-01-04 HISTORY — DX: Chronic kidney disease, unspecified: N18.9

## 2015-01-04 HISTORY — DX: Essential (primary) hypertension: I10

## 2015-01-04 LAB — GLUCOSE, CAPILLARY
Glucose-Capillary: 193 mg/dL — ABNORMAL HIGH (ref 65–99)
Glucose-Capillary: 146 mg/dL — ABNORMAL HIGH (ref 65–99)

## 2015-01-04 SURGERY — BLEPHAROPLASTY
Anesthesia: Monitor Anesthesia Care | Laterality: Bilateral | Wound class: Clean

## 2015-01-04 MED ORDER — LIDOCAINE-EPINEPHRINE 2 %-1:100000 IJ SOLN
INTRAMUSCULAR | Status: DC | PRN
  Administered 2015-01-04: 3 mL via OPHTHALMIC
  Administered 2015-01-04: 6.5 mL via OPHTHALMIC

## 2015-01-04 MED ORDER — ALFENTANIL 500 MCG/ML IJ INJ
INJECTION | INTRAMUSCULAR | Status: DC | PRN
  Administered 2015-01-04: 300 ug via INTRAVENOUS
  Administered 2015-01-04: 700 ug via INTRAVENOUS

## 2015-01-04 MED ORDER — BSS IO SOLN
INTRAOCULAR | Status: DC | PRN
  Administered 2015-01-04: 15 mL via INTRAOCULAR

## 2015-01-04 MED ORDER — TETRACAINE HCL 0.5 % OP SOLN
OPHTHALMIC | Status: DC | PRN
  Administered 2015-01-04: 2 [drp] via OPHTHALMIC

## 2015-01-04 MED ORDER — BACITRACIN 500 UNIT/GM OP OINT
TOPICAL_OINTMENT | OPHTHALMIC | Status: DC | PRN
  Administered 2015-01-04: 1 via OPHTHALMIC

## 2015-01-04 MED ORDER — MIDAZOLAM HCL 2 MG/2ML IJ SOLN
INTRAMUSCULAR | Status: DC | PRN
  Administered 2015-01-04 (×2): 0.5 mg via INTRAVENOUS
  Administered 2015-01-04: 1 mg via INTRAVENOUS

## 2015-01-04 MED ORDER — LACTATED RINGERS IV SOLN
INTRAVENOUS | Status: DC
  Administered 2015-01-04: 08:00:00 via INTRAVENOUS

## 2015-01-04 MED ORDER — PROPOFOL 500 MG/50ML IV EMUL
INTRAVENOUS | Status: DC | PRN
  Administered 2015-01-04: 10 ug/kg/min via INTRAVENOUS

## 2015-01-04 MED ORDER — OXYCODONE-ACETAMINOPHEN 5-325 MG PO TABS: 1.0000 | ORAL_TABLET | ORAL | Status: DC | PRN

## 2015-01-04 MED ORDER — BACITRACIN 500 UNIT/GM OP OINT: TOPICAL_OINTMENT | OPHTHALMIC | Status: DC

## 2015-01-04 MED ORDER — FENTANYL CITRATE (PF) 100 MCG/2ML IJ SOLN
INTRAMUSCULAR | Status: DC | PRN
  Administered 2015-01-04 (×4): 25 ug via INTRAVENOUS

## 2015-01-04 SURGICAL SUPPLY — 38 items
APPLICATOR COTTON TIP WD 3 STR (MISCELLANEOUS) ×6 IMPLANT
BLADE SURG 15 STRL LF DISP TIS (BLADE) ×1 IMPLANT
BLADE SURG 15 STRL SS (BLADE) ×3
CORD BIP STRL DISP 12FT (MISCELLANEOUS) ×3 IMPLANT
DRAPE HEAD BAR (DRAPES) ×3 IMPLANT
GAUZE SPONGE 4X4 12PLY STRL (GAUZE/BANDAGES/DRESSINGS) ×3 IMPLANT
GAUZE SPONGE NON-WVN 2X2 STRL (MISCELLANEOUS) ×10 IMPLANT
GLOVE SURG LX 7.0 MICRO (GLOVE) ×4
GLOVE SURG LX STRL 7.0 MICRO (GLOVE) ×2 IMPLANT
MARKER SKIN XFINE TIP W/RULER (MISCELLANEOUS) ×3 IMPLANT
NDL FILTER BLUNT 18X1 1/2 (NEEDLE) ×1 IMPLANT
NDL HYPO 30X.5 LL (NEEDLE) ×2 IMPLANT
NEEDLE FILTER BLUNT 18X 1/2SAF (NEEDLE) ×2
NEEDLE FILTER BLUNT 18X1 1/2 (NEEDLE) ×1 IMPLANT
NEEDLE HYPO 30X.5 LL (NEEDLE) ×6 IMPLANT
PACK DRAPE NASAL/ENT (PACKS) ×3 IMPLANT
SOL PREP PVP 2OZ (MISCELLANEOUS) ×3
SOLUTION PREP PVP 2OZ (MISCELLANEOUS) ×1 IMPLANT
SPONGE VERSALON 2X2 STRL (MISCELLANEOUS) ×30
SUT CHROMIC 4-0 (SUTURE)
SUT CHROMIC 4-0 M2 12X2 ARM (SUTURE)
SUT CHROMIC 5 0 P 3 (SUTURE) IMPLANT
SUT ETHILON 4 0 CL P 3 (SUTURE) IMPLANT
SUT MERSILENE 4-0 S-2 (SUTURE) ×5 IMPLANT
SUT PDS AB 4-0 P3 18 (SUTURE) IMPLANT
SUT PLAIN GUT (SUTURE) ×3 IMPLANT
SUT PROLENE 5 0 P 3 (SUTURE) ×3 IMPLANT
SUT PROLENE 6 0 P 1 18 (SUTURE) ×5 IMPLANT
SUT SILK 4 0 G 3 (SUTURE) IMPLANT
SUT VIC AB 5-0 P-3 18X BRD (SUTURE) IMPLANT
SUT VIC AB 5-0 P3 18 (SUTURE)
SUT VICRYL 6-0  S14 CTD (SUTURE)
SUT VICRYL 6-0 S14 CTD (SUTURE) IMPLANT
SUT VICRYL 7 0 TG140 8 (SUTURE) IMPLANT
SUTURE CHRMC 4-0 M2 12X2 ARM (SUTURE) IMPLANT
SYR 3ML LL SCALE MARK (SYRINGE) ×3 IMPLANT
SYRINGE 10CC LL (SYRINGE) ×3 IMPLANT
WATER STERILE IRR 500ML POUR (IV SOLUTION) ×3 IMPLANT

## 2015-01-04 NOTE — Transfer of Care (Signed)
Immediate Anesthesia Transfer of Care Note  Patient: Zachary Guerrero  Procedure(s) Performed: Procedure(s) with comments: BLEPHAROPLASTY (Bilateral) - DIABETIC-INSULIN AND ORAL MEDS/CPAP PTOSIS REPAIR (Bilateral) REPAIR OF ECTROPION EXTENSIVE (Bilateral)  Patient Location: PACU  Anesthesia Type: MAC  Level of Consciousness: awake, alert  and patient cooperative  Airway and Oxygen Therapy: Patient Spontanous Breathing and Patient connected to supplemental oxygen  Post-op Assessment: Post-op Vital signs reviewed, Patient's Cardiovascular Status Stable, Respiratory Function Stable, Patent Airway and No signs of Nausea or vomiting  Post-op Vital Signs: Reviewed and stable  Complications: No apparent anesthesia complications

## 2015-01-04 NOTE — Interval H&P Note (Signed)
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 After consideration of risks, benefits and other options for treatment, the patient has consented to  Procedure(s) with comments: BLEPHAROPLASTY (Bilateral) - DIABETIC-INSULIN AND ORAL MEDS/CPAP PTOSIS REPAIR (Bilateral) REPAIR OF ECTROPION EXTENSIVE (Bilateral) as a surgical intervention .  The patient's history has been reviewed, patient examined, no change in status, stable for surgery.  I have reviewed the patient's chart and labs.  Questions were answered to the patient's satisfaction.     Ether GriffinsFowler, Amy M

## 2015-01-04 NOTE — Anesthesia Procedure Notes (Signed)
Procedure Name: MAC Performed by: Jerrik Housholder Pre-anesthesia Checklist: Patient identified, Emergency Drugs available, Suction available, Patient being monitored and Timeout performed Patient Re-evaluated:Patient Re-evaluated prior to inductionOxygen Delivery Method: Nasal cannula Preoxygenation: Pre-oxygenation with 100% oxygen       

## 2015-01-04 NOTE — Op Note (Signed)
Preoperative Diagnosis:  1. Visually significant blepharoptosis both Upper Eyelid(s) 2. Visually significant dermatochalasis both Upper Eyelid(s) 3. Lower eyelid laxity with ectropion,  both  lower eyelid(s).  Postoperative Diagnosis:  Same.  Procedure(s) Performed:   1. Blepharoptosis repair with levator aponeurosis advancement both Upper Eyelid(s) 2. Upper eyelid blepharoplasty with excess skin excision  both Upper Eyelid(s) 3. Lateral tarsal strip procedure,  both  lower eyelid(s).  Teaching Surgeon: Hubbard RobinsonAmy M. Ether GriffinsFowler, M.D.  Assistants: none  Anesthesia: MAC  Specimens: None.  Estimated Blood Loss: Minimal.  Complications: None.  Operative Findings: None Dictated  Procedure:   Allergies were reviewed and the patient is allergic to prednisone and neomycin-polymyxin-gramicidin..    After the risks, benefits, complications and alternatives were discussed with the patient, appropriate informed consent was obtained.  While seated in an upright position and looking in primary gaze, the mid pupillary line was marked on the upper eyelid margins bilaterally. The patient was then brought to the operating suite and reclined supine.  Timeout was conducted and the patient was sedated.  Local anesthetic consisting of a 50-50 mixture of 2% lidocaine with epinephrine and 0.75% bupivacaine with added Hylenex was injected subcutaneously to both upper eyelid(s). Additional anesthetic was  injected subcutaneously to the bilateral lateral canthal region(s) and lower eyelid(s). Additional anesthetic was injected subconjunctivally to the both lower eyelid(s). Finally, anesthetic was injected down to the periosteum of the both lateral orbital rim(s). After adequate local was instilled, the patient was prepped and draped in the usual sterile fashion for eyelid surgery.   Attention was turned to the upper eyelids. A 7 mm upper eyelid crease incision line was marked with calipers on both upper eyelid(s).  A  pinch test was used to estimate the amount of excess skin to remove and this was marked in standard blepharoplasty style fashion. Attention was turned to the  right upper eyelid. A #15 blade was used to open the premarked incision line. A skin and muscle flap was excised and hemostasis was obtained with bipolar cautery.   The orbital septum was opened and the central and medial fat pockets were dissected free from fascial attachments, allowed to prolapse anteriorly, cauterized towards the pedicle base and excised.  Westcott scissors were then used to transect through orbicularis down to the tarsal plate. Epitarsus was dissected to create a smooth surface to suture to. Dissection was then carried superiorly in the plane between orbicularis and orbital septum. Once the preaponeurotic fat pocket was identified, the orbital septum was opened. This revealed the levator and its aponeurosis.    Attention was then turned to the opposite eyelid where the same procedure was performed in the same manner. Hemostasis was obtained with bipolar cautery throughout.   3 interrupted 6-0 Prolene sutures were then passed partial thickness through the tarsal plates of both upper eyelid(s). These sutures were placed in line with the mid pupillary, medial limbal, and lateral limbal lines. The sutures were fixed to the levator aponeurosis and adjusted until a nice lid height and contour were achieved. Once nice symmetry was achieved, the skin incisions were closed with a running 6-0 fast absorbing plain suture.    Attention was turned to the right lateral canthal angle. Westcott scissors were used to create a lateral canthotomy. Hemostasis was obtained with bipolar cautery. An inferior cantholysis was then performed with additional bipolar hemostasis. The anterior and posterior lamella of the lid were divided for approximately 8 mm.  A strip of the epithelium was excised off the superior  margin of the tarsal strip and  conjunctiva and retractors were incised off the inferior margin of the tarsal strip. A double-armed 4-0 Mersilene suture was then passed each arm through the terminal portion of the tarsal strip. Each arm of the suture was then passed through the periosteum of the inner portion of the lateral orbital rim at the level of Whitnall's tubercle. The sutures were advanced and this provided nice elevation and tightening of the lower eyelid. Once the suture was secured, a thin strip of follicle-bearing skin was excised. The lateral canthal angle was reformed with an interrupted 6-0 fast absorbing plain suture. Orbicularis was reapproximated with horizontal subcuticular 6-0 fast absorbing plain gut sutures. The skin was closed with interrupted 6-0 fast absorbing plain gut sutures.   Attention was then turned to the opposite eyelid where the same procedure was performed in the same manner.   The patient tolerated the procedure well.  Bacitracin ophthalmic ointment was applied to the incision site(s) followed by ice packs. The patient was taken to the recovery area where he recovered without difficulty.  Post-Op Plan/Instructions:  The patient was instructed to use ice packs frequently for the next 48 hours. He was instructed to use bacitracin ophthalmic ointment on His incisions 4 times a day for the next 12 to 14 days. He was given a prescription for Percocet for pain control should Tylenol not be effective. He was asked to follow up at the [facility] in [place], [place]  in 2 weeks' time or sooner as needed for problems.  Teaching Surgeon Attestation: None  Lateasha Breuer M. Ether Griffins, M.D. Attending,Ophthalmology

## 2015-01-04 NOTE — Anesthesia Preprocedure Evaluation (Signed)
Anesthesia Evaluation  Patient identified by MRN, date of birth, ID band  Airway Mallampati: II  TM Distance: >3 FB Neck ROM: Full    Dental   Pulmonary COPD,  COPD inhaler, former smoker,           Cardiovascular hypertension, Pt. on medications + CAD  + dysrhythmias Atrial Fibrillation      Neuro/Psych    GI/Hepatic   Endo/Other  diabetes, Well Controlled, Type 2  Renal/GU      Musculoskeletal   Abdominal   Peds  Hematology   Anesthesia Other Findings   Reproductive/Obstetrics                             Anesthesia Physical Anesthesia Plan  ASA: III  Anesthesia Plan: MAC   Post-op Pain Management:    Induction:   Airway Management Planned:   Additional Equipment:   Intra-op Plan:   Post-operative Plan:   Informed Consent: I have reviewed the patients History and Physical, chart, labs and discussed the procedure including the risks, benefits and alternatives for the proposed anesthesia with the patient or authorized representative who has indicated his/her understanding and acceptance.     Plan Discussed with: CRNA  Anesthesia Plan Comments:         Anesthesia Quick Evaluation

## 2015-01-04 NOTE — Anesthesia Postprocedure Evaluation (Signed)
Anesthesia Post Note  Patient: Alexandria LodgeWilliam R Isa  Procedure(s) Performed: Procedure(s) (LRB): BLEPHAROPLASTY (Bilateral) PTOSIS REPAIR (Bilateral) REPAIR OF ECTROPION EXTENSIVE (Bilateral)  Patient location during evaluation: PACU Anesthesia Type: General Level of consciousness: awake and alert Pain management: pain level controlled Vital Signs Assessment: post-procedure vital signs reviewed and stable Respiratory status: spontaneous breathing, nonlabored ventilation, respiratory function stable and patient connected to nasal cannula oxygen Cardiovascular status: blood pressure returned to baseline and stable Postop Assessment: no signs of nausea or vomiting Anesthetic complications: no    Dorene GrebeMcCulloch, Basel Defalco V

## 2015-01-04 NOTE — H&P (Signed)
  See signed H&P completed at [facility] and on the paper chart.

## 2015-01-05 ENCOUNTER — Encounter: Payer: Self-pay | Admitting: Ophthalmology

## 2015-09-19 DIAGNOSIS — G629 Polyneuropathy, unspecified: Secondary | ICD-10-CM | POA: Insufficient documentation

## 2015-09-19 DIAGNOSIS — E118 Type 2 diabetes mellitus with unspecified complications: Secondary | ICD-10-CM | POA: Insufficient documentation

## 2016-10-26 ENCOUNTER — Other Ambulatory Visit
Admission: RE | Admit: 2016-10-26 | Discharge: 2016-10-26 | Disposition: A | Discharge status: Home / Self Care | Payer: Medicare Other | Source: Ambulatory Visit | Attending: Student | Admitting: Student

## 2016-10-26 DIAGNOSIS — R197 Diarrhea, unspecified: Secondary | ICD-10-CM | POA: Diagnosis present

## 2016-10-26 LAB — GASTROINTESTINAL PANEL BY PCR, STOOL (REPLACES STOOL CULTURE)
ASTROVIRUS: NOT DETECTED
Adenovirus F40/41: NOT DETECTED
CYCLOSPORA CAYETANENSIS: NOT DETECTED
Campylobacter species: NOT DETECTED
Cryptosporidium: NOT DETECTED
ENTAMOEBA HISTOLYTICA: NOT DETECTED
ENTEROAGGREGATIVE E COLI (EAEC): NOT DETECTED
ENTEROPATHOGENIC E COLI (EPEC): NOT DETECTED
ENTEROTOXIGENIC E COLI (ETEC): NOT DETECTED
GIARDIA LAMBLIA: NOT DETECTED
Norovirus GI/GII: NOT DETECTED
Plesimonas shigelloides: NOT DETECTED
Rotavirus A: NOT DETECTED
Salmonella species: NOT DETECTED
Sapovirus (I, II, IV, and V): NOT DETECTED
Shiga like toxin producing E coli (STEC): NOT DETECTED
Shigella/Enteroinvasive E coli (EIEC): NOT DETECTED
VIBRIO CHOLERAE: NOT DETECTED
VIBRIO SPECIES: NOT DETECTED
Yersinia enterocolitica: DETECTED — AB

## 2016-10-30 LAB — CALPROTECTIN, FECAL: Calprotectin, Fecal: <16 ug/g (ref 0–120)

## 2016-11-01 LAB — PANCREATIC ELASTASE, FECAL: PANCREATIC ELASTASE-1, STL: 302 ug Elast./g (ref >200)

## 2016-11-21 ENCOUNTER — Other Ambulatory Visit
Admission: RE | Admit: 2016-11-21 | Discharge: 2016-11-21 | Disposition: A | Discharge status: Home / Self Care | Payer: Medicare Other | Source: Ambulatory Visit | Attending: Student | Admitting: Student

## 2016-11-21 DIAGNOSIS — Z8719 Personal history of other diseases of the digestive system: Secondary | ICD-10-CM | POA: Insufficient documentation

## 2016-11-21 LAB — GASTROINTESTINAL PANEL BY PCR, STOOL (REPLACES STOOL CULTURE)

## 2016-12-26 ENCOUNTER — Ambulatory Visit: Payer: Medicare Other | Admitting: Certified Registered Nurse Anesthetist

## 2016-12-26 ENCOUNTER — Encounter: Payer: Self-pay | Admitting: Certified Registered Nurse Anesthetist

## 2016-12-26 ENCOUNTER — Ambulatory Visit
Admission: RE | Admit: 2016-12-26 | Discharge: 2016-12-26 | Disposition: A | Discharge status: Home / Self Care | Payer: Medicare Other | Source: Ambulatory Visit | Attending: Internal Medicine | Admitting: Internal Medicine

## 2016-12-26 ENCOUNTER — Encounter
Admission: RE | Disposition: A | Discharge status: Home / Self Care | Payer: Self-pay | Source: Ambulatory Visit | Attending: Internal Medicine

## 2016-12-26 DIAGNOSIS — K219 Gastro-esophageal reflux disease without esophagitis: Secondary | ICD-10-CM | POA: Diagnosis not present

## 2016-12-26 DIAGNOSIS — N189 Chronic kidney disease, unspecified: Secondary | ICD-10-CM | POA: Diagnosis not present

## 2016-12-26 DIAGNOSIS — Z1211 Encounter for screening for malignant neoplasm of colon: Secondary | ICD-10-CM | POA: Insufficient documentation

## 2016-12-26 DIAGNOSIS — G4730 Sleep apnea, unspecified: Secondary | ICD-10-CM | POA: Diagnosis not present

## 2016-12-26 DIAGNOSIS — E1122 Type 2 diabetes mellitus with diabetic chronic kidney disease: Secondary | ICD-10-CM | POA: Insufficient documentation

## 2016-12-26 DIAGNOSIS — E7800 Pure hypercholesterolemia, unspecified: Secondary | ICD-10-CM | POA: Diagnosis not present

## 2016-12-26 DIAGNOSIS — I4891 Unspecified atrial fibrillation: Secondary | ICD-10-CM | POA: Insufficient documentation

## 2016-12-26 DIAGNOSIS — Z794 Long term (current) use of insulin: Secondary | ICD-10-CM | POA: Insufficient documentation

## 2016-12-26 DIAGNOSIS — Z87442 Personal history of urinary calculi: Secondary | ICD-10-CM | POA: Insufficient documentation

## 2016-12-26 DIAGNOSIS — J449 Chronic obstructive pulmonary disease, unspecified: Secondary | ICD-10-CM | POA: Insufficient documentation

## 2016-12-26 DIAGNOSIS — I2510 Atherosclerotic heart disease of native coronary artery without angina pectoris: Secondary | ICD-10-CM | POA: Diagnosis not present

## 2016-12-26 DIAGNOSIS — K640 First degree hemorrhoids: Secondary | ICD-10-CM | POA: Insufficient documentation

## 2016-12-26 DIAGNOSIS — Z7901 Long term (current) use of anticoagulants: Secondary | ICD-10-CM | POA: Diagnosis not present

## 2016-12-26 DIAGNOSIS — K5730 Diverticulosis of large intestine without perforation or abscess without bleeding: Secondary | ICD-10-CM | POA: Diagnosis not present

## 2016-12-26 DIAGNOSIS — I129 Hypertensive chronic kidney disease with stage 1 through stage 4 chronic kidney disease, or unspecified chronic kidney disease: Secondary | ICD-10-CM | POA: Diagnosis not present

## 2016-12-26 DIAGNOSIS — K449 Diaphragmatic hernia without obstruction or gangrene: Secondary | ICD-10-CM | POA: Insufficient documentation

## 2016-12-26 DIAGNOSIS — Z86010 Personal history of colonic polyps: Secondary | ICD-10-CM | POA: Insufficient documentation

## 2016-12-26 DIAGNOSIS — Z79899 Other long term (current) drug therapy: Secondary | ICD-10-CM | POA: Diagnosis not present

## 2016-12-26 DIAGNOSIS — K2270 Barrett's esophagus without dysplasia: Secondary | ICD-10-CM | POA: Insufficient documentation

## 2016-12-26 HISTORY — PX: COLONOSCOPY WITH PROPOFOL: SHX5780

## 2016-12-26 HISTORY — PX: ESOPHAGOGASTRODUODENOSCOPY (EGD) WITH PROPOFOL: SHX5813

## 2016-12-26 LAB — GLUCOSE, CAPILLARY: Glucose-Capillary: 132 mg/dL — ABNORMAL HIGH (ref 65–99)

## 2016-12-26 SURGERY — ESOPHAGOGASTRODUODENOSCOPY (EGD) WITH PROPOFOL
Anesthesia: General

## 2016-12-26 MED ORDER — SODIUM CHLORIDE 0.9 % IV SOLN
INTRAVENOUS | Status: DC
  Administered 2016-12-26: 1000 mL via INTRAVENOUS

## 2016-12-26 MED ORDER — PROPOFOL 500 MG/50ML IV EMUL
INTRAVENOUS | Status: AC
  Filled 2016-12-26: qty 50

## 2016-12-26 MED ORDER — LIDOCAINE HCL (PF) 2 % IJ SOLN
INTRAMUSCULAR | Status: AC
  Filled 2016-12-26: qty 10

## 2016-12-26 MED ORDER — PROPOFOL 10 MG/ML IV BOLUS
INTRAVENOUS | Status: AC
  Filled 2016-12-26: qty 20

## 2016-12-26 MED ORDER — PROPOFOL 10 MG/ML IV BOLUS
INTRAVENOUS | Status: DC | PRN
  Administered 2016-12-26: 20 mg via INTRAVENOUS
  Administered 2016-12-26: 40 mg via INTRAVENOUS

## 2016-12-26 MED ORDER — PROPOFOL 500 MG/50ML IV EMUL
INTRAVENOUS | Status: DC | PRN
  Administered 2016-12-26: 160 ug/kg/min via INTRAVENOUS

## 2016-12-26 MED ORDER — LIDOCAINE HCL (CARDIAC) 20 MG/ML IV SOLN
INTRAVENOUS | Status: DC | PRN
  Administered 2016-12-26: 50 mg via INTRAVENOUS

## 2016-12-26 MED ORDER — PHENYLEPHRINE HCL 10 MG/ML IJ SOLN
INTRAMUSCULAR | Status: DC | PRN
  Administered 2016-12-26: 100 ug via INTRAVENOUS

## 2016-12-26 NOTE — Anesthesia Procedure Notes (Signed)
Date/Time: 12/26/2016 8:01 AM Performed by: Ginger CarneMichelet, Hatice Bubel, CRNA Pre-anesthesia Checklist: Patient identified, Emergency Drugs available, Suction available, Patient being monitored and Timeout performed Patient Re-evaluated:Patient Re-evaluated prior to induction Oxygen Delivery Method: Nasal cannula

## 2016-12-26 NOTE — H&P (Signed)
Outpatient short stay form Pre-procedure 12/26/2016 8:00 AM Zachary Guerrero K. Norma Fredricksonoledo, M.D.  Primary Physician:Jeffrey Sparks, M.D.  Reason for visit:  Barrett's esophagus, GERD, personal history of colon polyps  History of present illness: Patient is a pleasant 71 year old male with the above medical history presenting for colon polyp and Barrett's surveillance. Patient denies any dysphagia, weight loss, abdominal pain, hematemesis, melena or hematochezia. He denies a change in bowel habits.    Current Facility-Administered Medications:  .  0.9 %  sodium chloride infusion, , Intravenous, Continuous, Covingtonoledo, Boykin Nearingeodoro K, MD, Last Rate: 20 mL/hr at 12/26/16 0727, 1,000 mL at 12/26/16 [id number]  Medications Prior to Admission  Medication Sig Dispense Refill Last Dose  . ALPRAZolam (XANAX) 0.25 MG tablet Take 0.25 mg by mouth at bedtime as needed. For sleep.   12/26/2016 at 0545  . bacitracin ophthalmic ointment Use on sutures 4 times a day for 12-14 days 3.5 g 3 12/26/2016 at Unknown time  . dofetilide (TIKOSYN) 125 MCG capsule Take 375 mcg by mouth every 12 (twelve) hours. AM AND PM  3 12/26/2016 at Unknown time  . glimepiride (AMARYL) 2 MG tablet Take 2 mg by mouth daily with breakfast.   Past Week at Unknown time  . LANTUS 100 UNIT/ML injection Inject 10 Units into the skin at bedtime.  0 Past Week at Unknown time  . linagliptin (TRADJENTA) 5 MG TABS tablet Take 5 mg by mouth daily. AM   12/26/2016 at Unknown time  . lovastatin (MEVACOR) 40 MG tablet Take 40 mg by mouth daily. AM  0 12/26/2016 at Unknown time  . magnesium oxide (MAG-OX) 400 MG tablet Take 400 mg by mouth daily. AM   12/25/2016 at Unknown time  . metFORMIN (GLUCOPHAGE) 1000 MG tablet Take 1,000 mg by mouth daily. AM  1 Past Week at Unknown time  . metoprolol succinate (TOPROL-XL) 50 MG 24 hr tablet Take 50 mg by mouth daily. 1/2 TABLET AM   12/26/2016 at 0545  . omeprazole (PRILOSEC) 20 MG capsule Take 20 mg by mouth daily. HS  4  12/26/2016 at Unknown time  . ondansetron (ZOFRAN) 4 MG tablet Take 1 tablet (4 mg total) by mouth daily as needed for nausea or vomiting. 10 tablet 0 Past Week at Unknown time  . oxyCODONE-acetaminophen (PERCOCET) 5-325 MG tablet Take 1 tablet by mouth every 4 (four) hours as needed for severe pain. 6 tablet 0 Past Week at Unknown time  . oxyCODONE-acetaminophen (ROXICET) 5-325 MG tablet Take 1 tablet by mouth every 6 (six) hours as needed. 25 tablet 0 Past Week at Unknown time  . PRADAXA 150 MG CAPS capsule Take 150 mg by mouth 2 (two) times daily. AM AND PM   Past Week at Unknown time  . PROAIR HFA 108 (90 BASE) MCG/ACT inhaler Inhale 2 puffs into the lungs every 6 (six) hours as needed. For shortness of breath and/or wheezing./ AM AND PM   Past Week at Unknown time  . SPIRIVA HANDIHALER 18 MCG inhalation capsule Place 18 mcg into inhaler and inhale daily. AM  5 Past Week at Unknown time  . SYMBICORT 160-4.5 MCG/ACT inhaler Inhale 2 puffs into the lungs 2 (two) times daily. AM AND PM  5 Past Week at Unknown time  . tamsulosin (FLOMAX) 0.4 MG CAPS capsule Take 1 capsule (0.4 mg total) by mouth daily. (Patient taking differently: Take 0.4 mg by mouth daily. AM) 10 capsule 0 Past Week at Unknown time  . magnesium oxide (MAG-OX) 400 (241.3 MG)  MG tablet Take 1 tablet by mouth 2 (two) times daily.  7 01/03/2015 at Unknown time     Allergies  Allergen Reactions  . Prednisone Other (See Comments)    HYPERACTIVITY/ TAKEN SINCE WITH NO PROBLEMS  . Neomycin-Polymyxin-Gramicidin Itching and Rash    When used topically      Past Medical History:  Diagnosis Date  . Blepharoptosis    BILATERAL  . Chronic kidney disease    KIDNEY STONES  . COPD (chronic obstructive pulmonary disease) (HCC)   . Coronary artery disease   . Diabetes mellitus without complication (HCC)   . Dysrhythmia    A-FIB/DR Zachary Guerrero  . Hypercholesteremia   . Hypertension    CONTROLLED ON MEDS  . Pneumonia    IN PAST  .  Reiter's syndrome (HCC)   . Shortness of breath dyspnea    WITH EXERTION  . Sleep apnea    C-PAP  . Wears dentures    UPPERS    Review of systems:   Negative except for recent episode of diarrhea requiring antibiotics for 3 days.   Physical Exam  General appearance: alert, cooperative and appears stated age Resp: clear to auscultation bilaterally Cardio: regular rate and rhythm, S1, S2 normal, no murmur, click, rub or gallop GI: soft, non-tender; bowel sounds normal; no masses,  no organomegaly     Planned procedures: EGD with surveillance biopsies for Barrett's esophagus and colonoscopy.The patient understands the nature of the planned procedure, indications, risks, alternatives and potential complications including but not limited to bleeding, infection, perforation, damage to internal organs and possible oversedation/side effects from anesthesia. The patient agrees and gives consent to proceed.  Please refer to procedure notes for findings, recommendations and patient disposition/instructions.    Zachary Guerrero K. Norma Fredricksonoledo, M.D. Gastroenterology 12/26/2016  8:00 AM

## 2016-12-26 NOTE — Interval H&P Note (Signed)
History and Physical Interval Note:  12/26/2016 8:51 AM  Zachary Guerrero  has presented today for surgery, with the diagnosis of HX POLYPS  GERD  BARRETTS  The various methods of treatment have been discussed with the patient and family. After consideration of risks, benefits and other options for treatment, the patient has consented to  Procedure(s): ESOPHAGOGASTRODUODENOSCOPY (EGD) WITH PROPOFOL (N/A) COLONOSCOPY WITH PROPOFOL (N/A) as a surgical intervention .  The patient's history has been reviewed, patient examined, no change in status, stable for surgery.  I have reviewed the patient's chart and labs.  Questions were answered to the patient's satisfaction.     Rust[place]oledo, Zachary Guerrero

## 2016-12-26 NOTE — Anesthesia Postprocedure Evaluation (Signed)
Anesthesia Post Note  Patient: Zachary Guerrero  Procedure(s) Performed: ESOPHAGOGASTRODUODENOSCOPY (EGD) WITH PROPOFOL (N/A ) COLONOSCOPY WITH PROPOFOL (N/A )  Patient location during evaluation: Endoscopy Anesthesia Type: General Level of consciousness: awake and alert Pain management: pain level controlled Vital Signs Assessment: post-procedure vital signs reviewed and stable Respiratory status: spontaneous breathing, nonlabored ventilation, respiratory function stable and patient connected to nasal cannula oxygen Cardiovascular status: blood pressure returned to baseline and stable Postop Assessment: no apparent nausea or vomiting Anesthetic complications: no     Last Vitals:  Vitals:   12/26/16 0900 12/26/16 0910  BP: 117/68 122/64  Pulse: 66 64  Resp: (!) 21 15  Temp:    SpO2: 96% 97%    Last Pain:  Vitals:   12/26/16 0840  TempSrc: Tympanic  PainSc: Asleep                 Lenard SimmerAndrew Shanoah Asbill

## 2016-12-26 NOTE — Op Note (Signed)
[facility]astroenter[facility] Patient Name: Zachary Guerrero Procedure Date: 12/26/2016 7:46 AM MRN: [id number] Account #: [id number] Date of Birth: 1945/06/26 Admit Type: Outpatient Age: 71 Room: [facility] END[facility] ROOM 4 Gender: Male Note Status: Finalized Procedure:            Upper GI endoscopy Indications:          Surveillance for malignancy due to personal history of                        Barrett's esophagus, Esophageal reflux Providers:            Boykin Nearing. Norma Fredrickson MD, MD Referring MD:         Duane Lope. Sparks, MD (Referring MD) Medicines:            Propofol per Anesthesia Procedure:            Pre-Anesthesia Assessment:                       - The risks and benefits of the procedure and the                        sedation options and risks were discussed with the                        patient. All questions were answered and informed                        consent was obtained.                       - Patient identification and proposed procedure were                        verified prior to the procedure by the nurse. The                        procedure was verified in the procedure room.                       - ASA Grade Assessment: II - A patient with mild                        systemic disease.                       - After reviewing the risks and benefits, the patient                        was deemed in satisfactory condition to undergo the                        procedure.                       After obtaining informed consent, the endoscope was                        passed under direct vision. Throughout the procedure,                        the patient's blood pressure, pulse,  and oxygen                        saturations were monitored continuously. The Endoscope                        was introduced through the mouth, and advanced to the                        third part of duodenum. The upper GI endoscopy was                        accomplished  without difficulty. The patient tolerated                        the procedure well. Findings:      The esophagus and gastroesophageal junction were examined with white       light. There were esophageal mucosal changes secondary to established       long-segment Barrett's disease. These changes involved the mucosa at the       upper extent of the gastric folds (42 cm from the incisors) extending to       the Z-line (37 cm from the incisors). Salmon-colored mucosa was present.       The maximum longitudinal extent of these esophageal mucosal changes was       5 cm in length. Mucosa was biopsied with a cold forceps for histology in       4 quadrants at intervals of 2 cm in the lower third of the esophagus.       One specimen bottle was sent to pathology.      A small hiatal hernia was present.      The exam was otherwise without abnormality. Impression:           - Esophageal mucosal changes secondary to established                        long-segment Barrett's disease. Biopsied.                       - Small hiatal hernia.                       - The examination was otherwise normal.                       - Normal examined duodenum. Recommendation:       - Await pathology results.                       - Repeat upper endoscopy [Day] for surveillance based                        on pathology results. Procedure Code(s):    --- Professional ---                       [phone], Esophagogastroduodenoscopy, flexible, transoral;                        with biopsy, single or multiple Diagnosis Code(s):    --- Professional ---  K22.70, Barrett's esophagus without dysplasia                       K44.9, Diaphragmatic hernia without obstruction or                        gangrene                       K21.9, Gastro-esophageal reflux disease without                        esophagitis CPT copyright 2016 American Medical Association. All rights reserved. The codes documented in this  report are preliminary and upon coder review may  be revised to meet current compliance requirements. Stanton Kidneyeodoro K Toledo MD, MD 12/26/2016 8:16:34 AM This report has been signed electronically. Number of Addenda: 0 Note Initiated On: 12/26/2016 7:46 AM      [facility]

## 2016-12-26 NOTE — Anesthesia Preprocedure Evaluation (Signed)
Anesthesia Evaluation  Patient identified by MRN, date of birth, ID band Patient awake    Reviewed: Allergy & Precautions, H&P , NPO status , Patient's Chart, lab work & pertinent test results, reviewed documented beta blocker date and time   History of Anesthesia Complications Negative for: history of anesthetic complications  Airway Mallampati: II  TM Distance: >3 FB Neck ROM: full    Dental  (+) Caps, Partial Upper, Dental Advidsory Given, Missing   Pulmonary shortness of breath and with exertion, sleep apnea and Continuous Positive Airway Pressure Ventilation , COPD,  COPD inhaler, neg recent URI, former smoker,           Cardiovascular Exercise Tolerance: Good hypertension, (-) angina+ CAD and + CABG  (-) Past MI and (-) Cardiac Stents + dysrhythmias Atrial Fibrillation (-) Valvular Problems/Murmurs     Neuro/Psych negative neurological ROS  negative psych ROS   GI/Hepatic negative GI ROS, Neg liver ROS,   Endo/Other  diabetes, Insulin Dependent  Renal/GU CRFRenal disease  negative genitourinary   Musculoskeletal   Abdominal   Peds  Hematology negative hematology ROS (+)   Anesthesia Other Findings Past Medical History: 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 No date: Chronic kidney disease     Comment:  KIDNEY STONES No date: COPD (chronic obstructive pulmonary disease) (HCC) No date: Coronary artery disease No date: Diabetes mellitus without complication (HCC) No date: Dysrhythmia     Comment:  A-FIB/DR KOWALSKI No date: Hypercholesteremia No date: Hypertension     Comment:  CONTROLLED ON MEDS No date: Pneumonia     Comment:  IN PAST 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) No date: Shortness of breath dyspnea     Comment:  WITH EXERTION No date: Sleep apnea     Comment:  C-PAP No date: Wears dentures     Comment:  UPPERS   Reproductive/Obstetrics negative OB ROS                              Anesthesia Physical Anesthesia Plan  ASA: III  Anesthesia Plan: General   Post-op Pain Management:    Induction: Intravenous  PONV Risk Score and Plan: 2 and Propofol infusion  Airway Management Planned: Nasal Cannula  Additional Equipment:   Intra-op Plan:   Post-operative Plan:   Informed Consent: I have reviewed the patients History and Physical, chart, labs and discussed the procedure including the risks, benefits and alternatives for the proposed anesthesia with the patient or authorized representative who has indicated his/her understanding and acceptance.   Dental Advisory Given  Plan Discussed with: Anesthesiologist, CRNA and Surgeon  Anesthesia Plan Comments:         Anesthesia Quick Evaluation

## 2016-12-26 NOTE — Anesthesia Post-op Follow-up Note (Signed)
Anesthesia QCDR form completed.        

## 2016-12-26 NOTE — Transfer of Care (Cosign Needed)
Immediate Anesthesia Transfer of Care Note  Patient: Zachary Guerrero  Procedure(s) Performed: ESOPHAGOGASTRODUODENOSCOPY (EGD) WITH PROPOFOL (N/A ) COLONOSCOPY WITH PROPOFOL (N/A )  Patient Location: PACU  Anesthesia Type:General  Level of Consciousness: sedated  Airway & Oxygen Therapy: Patient Spontanous Breathing and Patient connected to nasal cannula oxygen  Post-op Assessment: Report given to RN and Post -op Vital signs reviewed and stable  Post vital signs: Reviewed and stable  Last Vitals:  Vitals:   12/26/16 0709 12/26/16 0840  BP: 132/76   Pulse: 78   Resp: 16   Temp: (!) 35.9 C (!) (P) 36.1 C  SpO2: 97%     Last Pain:  Vitals:   12/26/16 0840  TempSrc: (P) Tympanic         Complications: No apparent anesthesia complications

## 2016-12-26 NOTE — Op Note (Signed)
[facility] Gastroenterology Patient Name: Zachary Guerrero Procedure Date: 12/26/2016 7:45 AM MRN: [id number] Account #: [id number] Date of Birth: 1945-08-20 Admit Type: Outpatient Age: 71 Room: [facility] ENDO ROOM 4 Gender: Male Note Status: Finalized Procedure:            Colonoscopy Indications:          High risk colon cancer surveillance: Personal history                        of colonic polyps Providers:            Boykin Nearing. Norma Fredrickson MD, MD Referring MD:         Duane Lope. Judithann Sheen, MD (Referring MD) Medicines:            Propofol per Anesthesia Complications:        No immediate complications. Procedure:            Pre-Anesthesia Assessment:                       - The risks and benefits of the procedure and the                        sedation options and risks were discussed with the                        patient. All questions were answered and informed                        consent was obtained.                       - Patient identification and proposed procedure were                        verified prior to the procedure by the nurse. The                        procedure was verified in the procedure room.                       - The risks and benefits of the procedure and the                        sedation options and risks were discussed with the                        patient. All questions were answered and informed                        consent was obtained.                       After obtaining informed consent, the colonoscope was                        passed under direct vision. Throughout the procedure,                        the patient's blood pressure, pulse, and oxygen  saturations were monitored continuously. The                        Colonoscope was introduced through the anus and                        advanced to the the cecum, identified by appendiceal                        orifice and ileocecal valve. The  colonoscopy was                        technically difficult and complex due to significant                        looping. Successful completion of the procedure was                        aided by applying abdominal pressure. The patient                        tolerated the procedure well. The quality of the bowel                        preparation was adequate to identify polyps. The                        ileocecal valve, appendiceal orifice, and rectum were                        photographed. Findings:      The perianal and digital rectal examinations were normal.      A tattoo was seen in the mid ascending colon. The tattoo site appeared       normal.      Multiple small and large-mouthed diverticula were found in the entire       colon. There was no evidence of diverticular bleeding.      Non-bleeding internal hemorrhoids were found during retroflexion. The       hemorrhoids were mild, small and Grade I (internal hemorrhoids that do       not prolapse).      The exam was otherwise without abnormality. Impression:           - A tattoo was seen in the mid ascending colon. The                        tattoo site appeared normal.                       - Moderate diverticulosis in the entire examined colon.                        There was no evidence of diverticular bleeding.                       - Non-bleeding internal hemorrhoids.                       - The examination was otherwise normal.                       -  No specimens collected. Recommendation:       - Patient has a contact number available for                        emergencies. The signs and symptoms of potential                        delayed complications were discussed with the patient.                        Return to normal activities tomorrow. Written discharge                        instructions were provided to the patient.                       - Resume previous diet.                       - Continue present  medications.                       - Repeat colonoscopy in 5 years for surveillance.                       - Return to GI office PRN.                       - The findings and recommendations were discussed with                        the patient and their family. Procedure Code(s):    --- Professional ---                       [id number], Colorectal cancer screening; colonoscopy on                        individual at high risk Diagnosis Code(s):    --- Professional ---                       Z86.010, Personal history of colonic polyps                       K64.0, First degree hemorrhoids                       K57.30, Diverticulosis of large intestine without                        perforation or abscess without bleeding CPT copyright 2016 American Medical Association. All rights reserved. The codes documented in this report are preliminary and upon coder review may  be revised to meet current compliance requirements. Stanton Kidneyeodoro K Termaine Roupp MD, MD 12/26/2016 8:38:57 AM This report has been signed electronically. Number of Addenda: 0 Note Initiated On: 12/26/2016 7:45 AM Scope Withdrawal Time: 0 hours 6 minutes 50 seconds  Total Procedure Duration: 0 hours 15 minutes 52 seconds       [facility]

## 2016-12-27 ENCOUNTER — Encounter: Payer: Self-pay | Admitting: Internal Medicine

## 2016-12-28 LAB — SURGICAL PATHOLOGY

## 2018-01-23 ENCOUNTER — Other Ambulatory Visit: Payer: Self-pay | Admitting: Family Medicine

## 2018-01-23 ENCOUNTER — Other Ambulatory Visit (HOSPITAL_COMMUNITY): Payer: Self-pay | Admitting: Family Medicine

## 2018-01-23 DIAGNOSIS — M25561 Pain in right knee: Secondary | ICD-10-CM

## 2018-02-02 ENCOUNTER — Ambulatory Visit
Admission: RE | Admit: 2018-02-02 | Discharge: 2018-02-02 | Disposition: A | Discharge status: Home / Self Care | Payer: Medicare Other | Source: Ambulatory Visit | Attending: Family Medicine | Admitting: Family Medicine

## 2018-02-02 DIAGNOSIS — M25561 Pain in right knee: Secondary | ICD-10-CM | POA: Diagnosis present

## 2018-02-08 ENCOUNTER — Ambulatory Visit: Payer: Medicare Other

## 2019-03-23 ENCOUNTER — Ambulatory Visit: Payer: Medicare PPO | Attending: Internal Medicine

## 2019-03-23 ENCOUNTER — Other Ambulatory Visit: Payer: Self-pay

## 2019-03-23 DIAGNOSIS — Z23 Encounter for immunization: Secondary | ICD-10-CM | POA: Insufficient documentation

## 2019-03-23 NOTE — Progress Notes (Signed)
   Covid-19 Vaccination Clinic  Name:  CALEY VOLKERT    MRN: [id number] DOB: [birth year]  03/23/2019  Mr. Carillo was observed post Covid-19 immunization for 15 minutes without incidence. He was provided with Vaccine Information Sheet and instruction to access the V-Safe system.   Mr. Silveria was instructed to call 911 with any severe reactions post vaccine: Marland Kitchen Difficulty breathing  . Swelling of your face and throat  . A fast heartbeat  . A bad rash all over your body  . Dizziness and weakness    Immunizations Administered    Name Date Dose VIS Date Route   Pfizer COVID-19 Vaccine 03/23/2019  9:23 AM 0.3 mL 01/09/2019 Intramuscular   Manufacturer: [employer], [employer]   Lot: [id number]   NDC: [address]-2

## 2019-04-15 ENCOUNTER — Ambulatory Visit: Payer: Medicare PPO

## 2019-04-21 ENCOUNTER — Ambulatory Visit: Payer: Medicare PPO | Attending: Internal Medicine

## 2019-04-21 DIAGNOSIS — Z23 Encounter for immunization: Secondary | ICD-10-CM

## 2019-04-21 NOTE — Progress Notes (Signed)
   Covid-19 Vaccination Clinic  Name:  Zachary Guerrero    MRN: [id number] DOB: 06-21-1945  04/21/2019  [name] was observed post Covid-19 immunization for 15 minutes without incident. He was provided with Vaccine Information Sheet and instruction to access the V-Safe system.   [name] was instructed to call 911 with any severe reactions post vaccine: Marland Kitchen Difficulty breathing  . Swelling of face and throat  . A fast heartbeat  . A bad rash all over body  . Dizziness and weakness   Immunizations Administered    Name Date Dose VIS Date Route   Pfizer COVID-19 Vaccine 04/21/2019 10:40 AM 0.3 mL 01/09/2019 Intramuscular   Manufacturer: [employer], [employer]   Lot: [id number]   NDC: [address]-6

## 2019-12-30 ENCOUNTER — Other Ambulatory Visit: Payer: Self-pay | Admitting: Internal Medicine

## 2019-12-30 ENCOUNTER — Other Ambulatory Visit (HOSPITAL_COMMUNITY): Payer: Self-pay | Admitting: Internal Medicine

## 2019-12-30 DIAGNOSIS — R1032 Left lower quadrant pain: Secondary | ICD-10-CM

## 2020-01-07 ENCOUNTER — Ambulatory Visit: Payer: Medicare PPO

## 2020-01-08 ENCOUNTER — Ambulatory Visit
Admission: RE | Admit: 2020-01-08 | Discharge: 2020-01-08 | Disposition: A | Discharge status: Home / Self Care | Payer: Medicare PPO | Source: Ambulatory Visit | Attending: Internal Medicine | Admitting: Internal Medicine

## 2020-01-08 ENCOUNTER — Other Ambulatory Visit: Payer: Self-pay

## 2020-01-08 DIAGNOSIS — R1032 Left lower quadrant pain: Secondary | ICD-10-CM | POA: Insufficient documentation

## 2020-01-08 MED ORDER — IOHEXOL 300 MG/ML  SOLN
125.0000 mL | Freq: Once | INTRAMUSCULAR | Status: AC | PRN
  Administered 2020-01-08: 125 mL via INTRAVENOUS

## 2020-01-18 DIAGNOSIS — K429 Umbilical hernia without obstruction or gangrene: Secondary | ICD-10-CM | POA: Insufficient documentation

## 2020-01-18 DIAGNOSIS — K4021 Bilateral inguinal hernia, without obstruction or gangrene, recurrent: Secondary | ICD-10-CM | POA: Insufficient documentation

## 2020-03-28 ENCOUNTER — Other Ambulatory Visit
Admission: RE | Admit: 2020-03-28 | Discharge: 2020-03-28 | Disposition: A | Discharge status: Home / Self Care | Payer: Medicare PPO | Source: Ambulatory Visit | Attending: Internal Medicine | Admitting: Internal Medicine

## 2020-03-28 ENCOUNTER — Other Ambulatory Visit: Payer: Self-pay

## 2020-03-28 DIAGNOSIS — Z01812 Encounter for preprocedural laboratory examination: Secondary | ICD-10-CM | POA: Insufficient documentation

## 2020-03-28 DIAGNOSIS — Z20822 Contact with and (suspected) exposure to covid-19: Secondary | ICD-10-CM | POA: Diagnosis not present

## 2020-03-28 LAB — SARS CORONAVIRUS 2 (TAT 6-24 HRS): SARS Coronavirus 2: NEGATIVE

## 2020-03-29 ENCOUNTER — Encounter: Payer: Self-pay | Admitting: Internal Medicine

## 2020-03-30 ENCOUNTER — Encounter
Admission: RE | Disposition: A | Discharge status: Home / Self Care | Payer: Self-pay | Source: Home / Self Care | Attending: Internal Medicine

## 2020-03-30 ENCOUNTER — Ambulatory Visit: Payer: Medicare PPO | Admitting: Certified Registered"

## 2020-03-30 ENCOUNTER — Ambulatory Visit
Admission: RE | Admit: 2020-03-30 | Discharge: 2020-03-30 | Disposition: A | Discharge status: Home / Self Care | Payer: Medicare PPO | Attending: Internal Medicine | Admitting: Internal Medicine

## 2020-03-30 ENCOUNTER — Encounter: Payer: Self-pay | Admitting: Internal Medicine

## 2020-03-30 ENCOUNTER — Other Ambulatory Visit: Payer: Self-pay

## 2020-03-30 DIAGNOSIS — Z13810 Encounter for screening for upper gastrointestinal disorder: Secondary | ICD-10-CM | POA: Diagnosis present

## 2020-03-30 DIAGNOSIS — Z79899 Other long term (current) drug therapy: Secondary | ICD-10-CM | POA: Insufficient documentation

## 2020-03-30 DIAGNOSIS — Z881 Allergy status to other antibiotic agents status: Secondary | ICD-10-CM | POA: Diagnosis not present

## 2020-03-30 DIAGNOSIS — K2100 Gastro-esophageal reflux disease with esophagitis, without bleeding: Secondary | ICD-10-CM | POA: Diagnosis not present

## 2020-03-30 DIAGNOSIS — Z7901 Long term (current) use of anticoagulants: Secondary | ICD-10-CM | POA: Insufficient documentation

## 2020-03-30 DIAGNOSIS — K2270 Barrett's esophagus without dysplasia: Secondary | ICD-10-CM | POA: Insufficient documentation

## 2020-03-30 DIAGNOSIS — Z87891 Personal history of nicotine dependence: Secondary | ICD-10-CM | POA: Insufficient documentation

## 2020-03-30 DIAGNOSIS — Z888 Allergy status to other drugs, medicaments and biological substances status: Secondary | ICD-10-CM | POA: Diagnosis not present

## 2020-03-30 DIAGNOSIS — Z7951 Long term (current) use of inhaled steroids: Secondary | ICD-10-CM | POA: Insufficient documentation

## 2020-03-30 DIAGNOSIS — Z794 Long term (current) use of insulin: Secondary | ICD-10-CM | POA: Diagnosis not present

## 2020-03-30 HISTORY — PX: ESOPHAGOGASTRODUODENOSCOPY (EGD) WITH PROPOFOL: SHX5813

## 2020-03-30 LAB — GLUCOSE, CAPILLARY: Glucose-Capillary: 131 mg/dL — ABNORMAL HIGH (ref 70–99)

## 2020-03-30 SURGERY — ESOPHAGOGASTRODUODENOSCOPY (EGD) WITH PROPOFOL
Anesthesia: General

## 2020-03-30 MED ORDER — PROPOFOL 10 MG/ML IV BOLUS
INTRAVENOUS | Status: AC
  Filled 2020-03-30: qty 20

## 2020-03-30 MED ORDER — SODIUM CHLORIDE 0.9 % IV SOLN: INTRAVENOUS | Status: DC

## 2020-03-30 MED ORDER — PROPOFOL 500 MG/50ML IV EMUL
INTRAVENOUS | Status: AC
  Filled 2020-03-30: qty 50

## 2020-03-30 MED ORDER — PROPOFOL 500 MG/50ML IV EMUL
INTRAVENOUS | Status: DC | PRN
  Administered 2020-03-30: 150 ug/kg/min via INTRAVENOUS

## 2020-03-30 MED ORDER — PROPOFOL 10 MG/ML IV BOLUS
INTRAVENOUS | Status: DC | PRN
  Administered 2020-03-30: 50 mg via INTRAVENOUS

## 2020-03-30 NOTE — Anesthesia Preprocedure Evaluation (Signed)
Anesthesia Evaluation  Patient identified by MRN, date of birth, ID band Patient awake    Reviewed: Allergy & Precautions, H&P , NPO status , Patient's Chart, lab work & pertinent test results, reviewed documented beta blocker date and time   History of Anesthesia Complications Negative for: history of anesthetic complications  Airway Mallampati: II  TM Distance: >3 FB Neck ROM: full    Dental  (+) Caps, Partial Upper, Dental Advidsory Given, Missing   Pulmonary shortness of breath and with exertion, sleep apnea and Continuous Positive Airway Pressure Ventilation , COPD,  COPD inhaler, neg recent URI, former smoker,           Cardiovascular Exercise Tolerance: Good hypertension, (-) angina+ CAD and + CABG  (-) Past MI and (-) Cardiac Stents + dysrhythmias Atrial Fibrillation (-) Valvular Problems/Murmurs     Neuro/Psych negative neurological ROS  negative psych ROS   GI/Hepatic negative GI ROS, Neg liver ROS,   Endo/Other  diabetes, Insulin Dependent  Renal/GU CRFRenal disease  negative genitourinary   Musculoskeletal   Abdominal   Peds  Hematology negative hematology ROS (+)   Anesthesia Other Findings Past Medical History: 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 No date: Chronic kidney disease     Comment:  KIDNEY STONES No date: COPD (chronic obstructive pulmonary disease) (HCC) No date: Coronary artery disease No date: Diabetes mellitus without complication (HCC) No date: Dysrhythmia     Comment:  A-FIB/DR KOWALSKI No date: Hypercholesteremia No date: Hypertension     Comment:  CONTROLLED ON MEDS No date: Pneumonia     Comment:  IN PAST 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) No date: Shortness of breath dyspnea     Comment:  WITH EXERTION No date: Sleep apnea     Comment:  C-PAP No date: Wears dentures     Comment:  UPPERS   Reproductive/Obstetrics negative OB ROS                              Anesthesia Physical  Anesthesia Plan  ASA: III  Anesthesia Plan: General   Post-op Pain Management:    Induction: Intravenous  PONV Risk Score and Plan: 2 and Propofol infusion and TIVA  Airway Management Planned: Nasal Cannula and Natural Airway  Additional Equipment:   Intra-op Plan:   Post-operative Plan:   Informed Consent: I have reviewed the patients History and Physical, chart, labs and discussed the procedure including the risks, benefits and alternatives for the proposed anesthesia with the patient or authorized representative who has indicated his/her understanding and acceptance.     Dental Advisory Given  Plan Discussed with: Anesthesiologist, CRNA and Surgeon  Anesthesia Plan Comments:         Anesthesia Quick Evaluation

## 2020-03-30 NOTE — Transfer of Care (Signed)
Immediate Anesthesia Transfer of Care Note  Patient: Zachary Guerrero  Procedure(s) Performed: ESOPHAGOGASTRODUODENOSCOPY (EGD) WITH PROPOFOL (N/A )  Patient Location: Endoscopy Unit  Anesthesia Type:General  Level of Consciousness: awake, alert  and oriented  Airway & Oxygen Therapy: Patient Spontanous Breathing and Patient connected to nasal cannula oxygen  Post-op Assessment: Report given to RN and Post -op Vital signs reviewed and stable  Post vital signs: Reviewed and stable  Last Vitals:  Vitals Value Taken Time  BP    Temp    Pulse    Resp    SpO2      Last Pain:  Vitals:   03/30/20 0827  TempSrc: Temporal         Complications: No complications documented.

## 2020-03-30 NOTE — Anesthesia Postprocedure Evaluation (Signed)
Anesthesia Post Note  Patient: Zachary Guerrero  Procedure(s) Performed: ESOPHAGOGASTRODUODENOSCOPY (EGD) WITH PROPOFOL (N/A )  Patient location during evaluation: Endoscopy Anesthesia Type: General Level of consciousness: awake and alert Pain management: pain level controlled Vital Signs Assessment: post-procedure vital signs reviewed and stable Respiratory status: spontaneous breathing, nonlabored ventilation, respiratory function stable and patient connected to nasal cannula oxygen Cardiovascular status: blood pressure returned to baseline and stable Postop Assessment: no apparent nausea or vomiting Anesthetic complications: no   No complications documented.   Last Vitals:  Vitals:   03/30/20 0916 03/30/20 0926  BP: 112/65 128/74  Pulse: 74 77  Resp: 17 18  Temp:    SpO2: 97% 93%    Last Pain:  Vitals:   03/30/20 0926  TempSrc:   PainSc: 0-No pain                 Lenard Simmer

## 2020-03-30 NOTE — Interval H&P Note (Signed)
History and Physical Interval Note:  03/30/2020 8:45 AM  Zachary Guerrero  has presented today for surgery, with the diagnosis of Barretts esophagus, hx of colon polyps  Diarrhea.  The various methods of treatment have been discussed with the patient and family. After consideration of risks, benefits and other options for treatment, the patient has consented to  Procedure(s): ESOPHAGOGASTRODUODENOSCOPY (EGD) WITH PROPOFOL (N/A) as a surgical intervention.  The patient's history has been reviewed, patient examined, no change in status, stable for surgery.  I have reviewed the patient's chart and labs.  Questions were answered to the patient's satisfaction.     [place], [place]

## 2020-03-30 NOTE — Op Note (Signed)
[facility] Gastroenterology Patient Name: Zachary Guerrero Procedure Date: 03/30/2020 8:21 AM MRN: [id number] Account #: [id number] Date of Birth: [birth year] Admit Type: Outpatient Age: 75 Room: [facility] ENDO ROOM 2 Gender: Male Note Status: Finalized Procedure:             Upper GI endoscopy Indications:           Surveillance for malignancy due to personal history of                         Barrett's esophagus Providers:             Boykin Nearing. Norma Fredrickson MD, MD Referring MD:          Duane Lope. Judithann Sheen, MD (Referring MD) Medicines:             Propofol per Anesthesia Complications:         No immediate complications. Procedure:             Pre-Anesthesia Assessment:                        - The risks and benefits of the procedure and the                         sedation options and risks were discussed with the                         patient. All questions were answered and informed                         consent was obtained.                        - Patient identification and proposed procedure were                         verified prior to the procedure by the nurse. The                         procedure was verified in the procedure room.                        - ASA Grade Assessment: III - A patient with severe                         systemic disease.                        - After reviewing the risks and benefits, the patient                         was deemed in satisfactory condition to undergo the                         procedure.                        After obtaining informed consent, the endoscope was                         passed under direct vision.  Throughout the procedure,                         the patient's blood pressure, pulse, and oxygen                         saturations were monitored continuously. The Endoscope                         was introduced through the mouth, and advanced to the                         third part of duodenum. The  upper GI endoscopy was                         accomplished without difficulty. The patient tolerated                         the procedure well. Findings:      There were esophageal mucosal changes secondary to established       long-segment Barrett's disease present in the distal esophagus. The       maximum longitudinal extent of these mucosal changes was 6 cm in length.       Mucosa was biopsied with a cold forceps for histology in 4 quadrants at       intervals of 2 cm from 37 to 41 cm from the incisors. A total of 3       specimen bottles were sent to pathology. Estimated blood loss was       minimal.      Diffuse mild inflammation characterized by erythema was found in the       entire examined stomach.      The examined duodenum was normal.      The exam was otherwise without abnormality. Impression:            - Esophageal mucosal changes secondary to established                         long-segment Barrett's disease. Biopsied.                        - Gastritis.                        - Normal examined duodenum.                        - The examination was otherwise normal. Recommendation:        - Patient has a contact number available for                         emergencies. The signs and symptoms of potential                         delayed complications were discussed with the patient.                         Return to normal activities tomorrow. Written  discharge instructions were provided to the patient.                        - Resume previous diet.                        - Continue present medications.                        - Await pathology results.                        - Repeat upper endoscopy after studies are complete                         for surveillance of Barrett's esophagus.                        - Perform a colonoscopy at appointment to be scheduled.                        - Patient prep was inadequate per his history to                          proceed with colonoscopy today for personal history of                         polyps.                        - Will require 4 liter golytely prep with dulcolax for                         next prep.                        - The findings and recommendations were discussed with                         the patient. Procedure Code(s):     --- Professional ---                        [phone], Esophagogastroduodenoscopy, flexible,                         transoral; with biopsy, single or multiple Diagnosis Code(s):     --- Professional ---                        K29.70, Gastritis, unspecified, without bleeding                        K22.70, Barrett's esophagus without dysplasia CPT copyright 2019 [employer]. All rights reserved. The codes documented in this report are preliminary and upon coder review may  be revised to meet current compliance requirements. [name] MD, MD 03/30/2020 9:06:19 AM This report has been signed electronically. Number of Addenda: 0 Note Initiated On: 03/30/2020 8:21 AM Estimated Blood Loss:  Estimated blood loss was minimal.      [facility]

## 2020-03-30 NOTE — H&P (Signed)
Outpatient short stay form Pre-procedure 03/30/2020 8:43 AM Zachary Guerrero, M.D.  Primary Physician: Aram Beecham, M.D.  Reason for visit:  Barrett's esophagus  History of present illness: 75 y/o patient has a personal history of Barrett's esophagus without dysplasia. Patient denies hemetemesis, nausea, vomiting, dysphagia, weight loss. Takes PPI without significant side effects.    No current facility-administered medications for this encounter.  Medications Prior to Admission  Medication Sig Dispense Refill Last Dose  . ALPRAZolam (XANAX) 0.25 MG tablet Take 0.25 mg by mouth at bedtime as needed. For sleep.   Past Month at Unknown time  . dofetilide (TIKOSYN) 125 MCG capsule Take 375 mcg by mouth every 12 (twelve) hours. AM AND PM  3 03/30/2020 at 0630  . glimepiride (AMARYL) 2 MG tablet Take 2 mg by mouth daily with breakfast.   03/29/2020 at 0800  . LANTUS 100 UNIT/ML injection Inject 10 Units into the skin at bedtime.  0 03/29/2020 at 2200  . linagliptin (TRADJENTA) 5 MG TABS tablet Take 5 mg by mouth daily. AM   03/30/2020 at 0630  . lovastatin (MEVACOR) 40 MG tablet Take 40 mg by mouth daily. AM  0 03/29/2020 at Unknown time  . magnesium oxide (MAG-OX) 400 (241.3 MG) MG tablet Take 1 tablet by mouth 2 (two) times daily.  7 03/29/2020 at Unknown time  . metFORMIN (GLUCOPHAGE) 1000 MG tablet Take 1,000 mg by mouth daily. AM  1 03/29/2020 at 1800  . metoprolol succinate (TOPROL-XL) 50 MG 24 hr tablet Take 50 mg by mouth daily. 1/2 TABLET AM   03/30/2020 at 0630  . omeprazole (PRILOSEC) 20 MG capsule Take 20 mg by mouth daily. HS  4 03/29/2020 at 2200  . ondansetron (ZOFRAN) 4 MG tablet Take 1 tablet (4 mg total) by mouth daily as needed for nausea or vomiting. 10 tablet 0 Past Month at Unknown time  . oxyCODONE-acetaminophen (PERCOCET) 5-325 MG tablet Take 1 tablet by mouth every 4 (four) hours as needed for severe pain. 6 tablet 0 Past Month at Unknown time  . PRADAXA 150 MG CAPS capsule Take 150 mg  by mouth 2 (two) times daily. AM AND PM   03/30/2020 at 0630  . PROAIR HFA 108 (90 BASE) MCG/ACT inhaler Inhale 2 puffs into the lungs every 6 (six) hours as needed. For shortness of breath and/or wheezing./ AM AND PM   Past Month at Unknown time  . SPIRIVA HANDIHALER 18 MCG inhalation capsule Place 18 mcg into inhaler and inhale daily. AM  5 Past Month at Unknown time  . SYMBICORT 160-4.5 MCG/ACT inhaler Inhale 2 puffs into the lungs 2 (two) times daily. AM AND PM  5 Past Month at Unknown time  . tamsulosin (FLOMAX) 0.4 MG CAPS capsule Take 1 capsule (0.4 mg total) by mouth daily. (Patient taking differently: Take 0.4 mg by mouth daily. AM) 10 capsule 0 Past Month at Unknown time  . bacitracin ophthalmic ointment Use on sutures 4 times a day for 12-14 days (Patient not taking: Reported on 03/30/2020) 3.5 g 3 Not Taking at Unknown time  . magnesium oxide (MAG-OX) 400 MG tablet Take 400 mg by mouth daily. AM     . oxyCODONE-acetaminophen (ROXICET) 5-325 MG tablet Take 1 tablet by mouth every 6 (six) hours as needed. 25 tablet 0      Allergies  Allergen Reactions  . Prednisone Other (See Comments)    HYPERACTIVITY/ TAKEN SINCE WITH NO PROBLEMS  . Neomycin-Polymyxin-Gramicidin Itching and Rash  When used topically      Past Medical History:  Diagnosis Date  . Blepharoptosis    BILATERAL  . Chronic kidney disease    KIDNEY STONES  . COPD (chronic obstructive pulmonary disease) (HCC)   . Coronary artery disease   . Diabetes mellitus without complication (HCC)   . Dysrhythmia    A-FIB/DR Zachary Guerrero  . Hypercholesteremia   . Hypertension    CONTROLLED ON MEDS  . Pneumonia    IN PAST  . Reiter's syndrome   . Shortness of breath dyspnea    WITH EXERTION  . Sleep apnea    C-PAP  . Wears dentures    UPPERS    Review of systems:  Otherwise negative.    Physical Exam  Gen: Alert, oriented. Appears stated age.  HEENT: [place]/AT. PERRLA. Lungs: CTA, no wheezes. CV: RR nl S1, S2. Abd:  soft, benign, no masses. BS+ Ext: No edema. Pulses 2+    Planned procedures: Proceed with EGD. The patient understands the nature of the planned procedure, indications, risks, alternatives and potential complications including but not limited to bleeding, infection, perforation, damage to internal organs and possible oversedation/side effects from anesthesia. The patient agrees and gives consent to proceed.  Please refer to procedure notes for findings, recommendations and patient disposition/instructions.     Zachary Guerrero, M.D. Gastroenterology 03/30/2020  8:43 AM

## 2020-03-31 ENCOUNTER — Encounter: Payer: Self-pay | Admitting: Internal Medicine

## 2020-03-31 LAB — SURGICAL PATHOLOGY

## 2021-03-20 ENCOUNTER — Other Ambulatory Visit: Payer: Self-pay

## 2021-03-20 ENCOUNTER — Encounter: Payer: Self-pay | Admitting: *Deleted

## 2021-03-20 ENCOUNTER — Encounter: Payer: Medicare PPO | Attending: Internal Medicine | Admitting: *Deleted

## 2021-03-20 DIAGNOSIS — J449 Chronic obstructive pulmonary disease, unspecified: Secondary | ICD-10-CM

## 2021-03-20 NOTE — Progress Notes (Signed)
Completed virtual orientation today.  EP evaluation is scheduled for Monday 3/6 at 9am.  Documentation for diagnosis can be found in [facility]xx encounter 01/03/21.

## 2021-04-03 ENCOUNTER — Other Ambulatory Visit: Payer: Self-pay

## 2021-04-03 ENCOUNTER — Encounter: Payer: Medicare PPO | Attending: Internal Medicine

## 2021-04-03 VITALS — Ht 71.25 in | Wt 266.7 lb

## 2021-04-03 DIAGNOSIS — J449 Chronic obstructive pulmonary disease, unspecified: Secondary | ICD-10-CM | POA: Diagnosis present

## 2021-04-03 NOTE — Progress Notes (Signed)
Pulmonary Individual Treatment Plan  Patient Details  Name: Zachary Guerrero MRN: [id number] Date of Birth: [birth year] Referring Provider:   Flowsheet Row Pulmonary Rehab from 04/03/2021 in [facility] Cardiac and Pulmonary Rehab  Referring Provider Sparks       Initial Encounter Date:  Flowsheet Row Pulmonary Rehab from 04/03/2021 in [facility] Cardiac and Pulmonary Rehab  Date 04/03/21       Visit Diagnosis: Chronic obstructive pulmonary disease, unspecified COPD type ([place])  Patient's Home Medications on Admission:  Current Outpatient Medications:    ALPRAZolam (XANAX) 0.25 MG tablet, Take 0.25 mg by mouth at bedtime as needed. For sleep., Disp: , Rfl:    amiodarone (PACERONE) 200 MG tablet, Take 1 tablet by mouth daily., Disp: , Rfl:    bacitracin ophthalmic ointment, Use on sutures 4 times a day for 12-14 days (Patient not taking: Reported on 03/30/2020), Disp: 3.5 g, Rfl: 3   dofetilide (TIKOSYN) 125 MCG capsule, Take 375 mcg by mouth every 12 (twelve) hours. AM AND PM (Patient not taking: Reported on 03/20/2021), Disp: , Rfl: 3   gabapentin (NEURONTIN) 300 MG capsule, TAKE 1 CAPSULE BY MOUTH THREE TIMES A DAY, Disp: , Rfl:    glimepiride (AMARYL) 2 MG tablet, Take 2 mg by mouth daily with breakfast. (Patient not taking: Reported on 03/20/2021), Disp: , Rfl:    LANTUS 100 UNIT/ML injection, Inject 10 Units into the skin at bedtime., Disp: , Rfl: 0   linagliptin (TRADJENTA) 5 MG TABS tablet, Take 5 mg by mouth daily. AM (Patient not taking: Reported on 03/20/2021), Disp: , Rfl:    loratadine (CLARITIN) 10 MG tablet, Take by mouth., Disp: , Rfl:    lovastatin (MEVACOR) 40 MG tablet, Take 40 mg by mouth daily. AM, Disp: , Rfl: 0   magnesium oxide (MAG-OX) 400 (241.3 MG) MG tablet, Take 1 tablet by mouth 2 (two) times daily., Disp: , Rfl: 7   magnesium oxide (MAG-OX) 400 MG tablet, Take 400 mg by mouth daily. AM (Patient not taking: Reported on 03/20/2021), Disp: , Rfl:    meloxicam (MOBIC) 15 MG  tablet, Take 1 tablet by mouth daily., Disp: , Rfl:    metFORMIN (GLUCOPHAGE) 1000 MG tablet, Take 1,000 mg by mouth daily. AM, Disp: , Rfl: 1   metoprolol succinate (TOPROL-XL) 50 MG 24 hr tablet, Take 50 mg by mouth daily. 1/2 TABLET AM, Disp: , Rfl:    omeprazole (PRILOSEC) 20 MG capsule, Take 20 mg by mouth daily. HS, Disp: , Rfl: 4   ondansetron (ZOFRAN) 4 MG tablet, Take 1 tablet (4 mg total) by mouth daily as needed for nausea or vomiting., Disp: 10 tablet, Rfl: 0   oxyCODONE-acetaminophen (PERCOCET) 5-325 MG tablet, Take 1 tablet by mouth every 4 (four) hours as needed for severe pain. (Patient not taking: Reported on 03/20/2021), Disp: 6 tablet, Rfl: 0   oxyCODONE-acetaminophen (ROXICET) 5-325 MG tablet, Take 1 tablet by mouth every 6 (six) hours as needed. (Patient not taking: Reported on 03/20/2021), Disp: 25 tablet, Rfl: 0   PRADAXA 150 MG CAPS capsule, Take 150 mg by mouth 2 (two) times daily. AM AND PM, Disp: , Rfl:    PROAIR HFA 108 (90 BASE) MCG/ACT inhaler, Inhale 2 puffs into the lungs every 6 (six) hours as needed. For shortness of breath and/or wheezing./ AM AND PM, Disp: , Rfl:    QUEtiapine (SEROQUEL) 25 MG tablet, Take by mouth., Disp: , Rfl:    Semaglutide-Weight Management 0.5 MG/0.5ML SOAJ, Inject into the skin., Disp: ,  Rfl:    SPIRIVA HANDIHALER 18 MCG inhalation capsule, Place 18 mcg into inhaler and inhale daily. AM (Patient not taking: Reported on 03/20/2021), Disp: , Rfl: 5   SYMBICORT 160-4.5 MCG/ACT inhaler, Inhale 2 puffs into the lungs 2 (two) times daily. AM AND PM (Patient not taking: Reported on 03/20/2021), Disp: , Rfl: 5   tamsulosin (FLOMAX) 0.4 MG CAPS capsule, Take 1 capsule (0.4 mg total) by mouth daily. (Patient taking differently: Take 0.4 mg by mouth daily. AM), Disp: 10 capsule, Rfl: 0   TRELEGY ELLIPTA 100-62.5-25 MCG/ACT AEPB, Inhale into the lungs., Disp: , Rfl:   Past Medical History: Past Medical History:  Diagnosis Date   Blepharoptosis     BILATERAL   Chronic kidney disease    KIDNEY STONES   COPD (chronic obstructive pulmonary disease) ([place])    Coronary artery disease    Diabetes mellitus without complication (HCC)    Dysrhythmia    A-FIB/DR KOWALSKI   Hypercholesteremia    Hypertension    CONTROLLED ON MEDS   Pneumonia    IN PAST   Reiter's syndrome    Shortness of breath dyspnea    WITH EXERTION   Sleep apnea    C-PAP   Wears dentures    UPPERS    Tobacco Use: Social History   Tobacco Use  Smoking Status Former   Packs/day: 2.00   Years: 20.00   Pack years: 40.00   Types: Cigarettes   Quit date: 03/31/2010   Years since quitting: 11.0  Smokeless Tobacco Former    Labs: Recent Chemical engineer   There is no flowsheet data to display.      Pulmonary Assessment Scores:  Pulmonary Assessment Scores     Row Name 04/03/21 1134         ADL UCSD   SOB Score total 58     Rest 3     Walk 3     Stairs 4     Bath 2     Dress 1     Shop 2       CAT Score   CAT Score 25       mMRC Score   mMRC Score 1              UCSD: Self-administered rating of dyspnea associated with activities of daily living (ADLs) 6-point scale (0 = "not at all" to 5 = "maximal or unable to do because of breathlessness")  Scoring Scores range from 0 to 120.  Minimally important difference is 5 units  CAT: CAT can identify the health impairment of COPD patients and is better correlated with disease progression.  CAT has a scoring range of zero to 40. The CAT score is classified into four groups of low (less than 10), medium (10 - 20), high (21-30) and very high (31-40) based on the impact level of disease on health status. A CAT score over 10 suggests significant symptoms.  A worsening CAT score could be explained by an exacerbation, poor medication adherence, poor inhaler technique, or progression of COPD or comorbid conditions.  CAT MCID is 2 points  mMRC: mMRC (Modified Medical Research Council) Dyspnea  Scale is used to assess the degree of baseline functional disability in patients of respiratory disease due to dyspnea. No minimal important difference is established. A decrease in score of 1 point or greater is considered a positive change.   Pulmonary Function Assessment:  Pulmonary Function Assessment - 04/03/21 1134  Pulmonary Function Tests   FVC% 75 %    FEV1% 59 %    FEV1/FVC Ratio 61.41             Exercise Target Goals: Exercise Program Goal: Individual exercise prescription set using results from initial 6 min walk test and THRR while considering  patients activity barriers and safety.   Exercise Prescription Goal: Initial exercise prescription builds to 30-45 minutes a day of aerobic activity, 2-3 days per week.  Home exercise guidelines will be given to patient during program as part of exercise prescription that the participant will acknowledge.  Education: Aerobic Exercise: - Group verbal and visual presentation on the components of exercise prescription. Introduces F.I.T.T principle from ACSM for exercise prescriptions.  Reviews F.I.T.T. principles of aerobic exercise including progression. Written material given at graduation.   Education: Resistance Exercise: - Group verbal and visual presentation on the components of exercise prescription. Introduces F.I.T.T principle from ACSM for exercise prescriptions  Reviews F.I.T.T. principles of resistance exercise including progression. Written material given at graduation.    Education: Exercise & Equipment Safety: - Individual verbal instruction and demonstration of equipment use and safety with use of the equipment. Flowsheet Row Pulmonary Rehab from 04/03/2021 in [facility] Cardiac and Pulmonary Rehab  Date 04/03/21  Educator AS  Instruction Review Code 1- Verbalizes Understanding       Education: Exercise Physiology & General Exercise Guidelines: - Group verbal and written instruction with models to review the  exercise physiology of the cardiovascular system and associated critical values. Provides general exercise guidelines with specific guidelines to those with heart or lung disease.    Education: Flexibility, Balance, Mind/Body Relaxation: - Group verbal and visual presentation with interactive activity on the components of exercise prescription. Introduces F.I.T.T principle from ACSM for exercise prescriptions. Reviews F.I.T.T. principles of flexibility and balance exercise training including progression. Also discusses the mind body connection.  Reviews various relaxation techniques to help reduce and manage stress (i.e. Deep breathing, progressive muscle relaxation, and visualization). Balance handout provided to take home. Written material given at graduation.   Activity Barriers & Risk Stratification:  Activity Barriers & Cardiac Risk Stratification - 03/20/21 1124       Activity Barriers & Cardiac Risk Stratification   Activity Barriers Shortness of Breath;Deconditioning;Muscular Weakness             6 Minute Walk:  6 Minute Walk     Row Name 04/03/21 1117         6 Minute Walk   Phase Initial     Distance 1450 feet     Walk Time 6 minutes     # of Rest Breaks 0     MPH 2.75     METS 2.65     RPE 13     Perceived Dyspnea  3     VO2 Peak 9.27     Symptoms Yes (comment)     Comments SOB     Resting HR 68 bpm     Resting BP 138/60     Resting Oxygen Saturation  94 %     Exercise Oxygen Saturation  during 6 min walk 88 %     Max Ex. HR 103 bpm     Max Ex. BP 154/68     2 Minute Post BP 132/62       Interval HR   1 Minute HR 84     2 Minute HR 98     3 Minute HR 100  4 Minute HR 98     5 Minute HR 103     6 Minute HR 101     2 Minute Post HR 82     Interval Heart Rate? Yes       Interval Oxygen   Interval Oxygen? Yes     Baseline Oxygen Saturation % 94 %     1 Minute Oxygen Saturation % 91 %     1 Minute Liters of Oxygen 0 L     2 Minute Oxygen  Saturation % 92 %     2 Minute Liters of Oxygen 0 L     3 Minute Oxygen Saturation % 89 %     3 Minute Liters of Oxygen 0 L     4 Minute Oxygen Saturation % 88 %     4 Minute Liters of Oxygen 0 L     5 Minute Oxygen Saturation % 88 %     5 Minute Liters of Oxygen 0 L     6 Minute Oxygen Saturation % 88 %     6 Minute Liters of Oxygen 0 L     2 Minute Post Oxygen Saturation % 92 %     2 Minute Post Liters of Oxygen 0 L             Oxygen Initial Assessment:  Oxygen Initial Assessment - 03/20/21 1133       Home Oxygen   Home Oxygen Device None    Sleep Oxygen Prescription None    Home Exercise Oxygen Prescription None    Home Resting Oxygen Prescription None      Initial 6 min Walk   Oxygen Used None      Program Oxygen Prescription   Program Oxygen Prescription None      Intervention   Short Term Goals To learn and understand importance of monitoring SPO2 with pulse oximeter and demonstrate accurate use of the pulse oximeter.;To learn and understand importance of maintaining oxygen saturations>88%;To learn and demonstrate proper pursed lip breathing techniques or other breathing techniques. ;To learn and demonstrate proper use of respiratory medications    Long  Term Goals Verbalizes importance of monitoring SPO2 with pulse oximeter and return demonstration;Maintenance of O2 saturations>88%;Exhibits proper breathing techniques, such as pursed lip breathing or other method taught during program session;Compliance with respiratory medication;Demonstrates proper use of MDIs             Oxygen Re-Evaluation:   Oxygen Discharge (Final Oxygen Re-Evaluation):   Initial Exercise Prescription:  Initial Exercise Prescription - 04/03/21 1100       Date of Initial Exercise RX and Referring Provider   Date 04/03/21    Referring Provider Sparks      Oxygen   Maintain Oxygen Saturation 88% or higher      Treadmill   MPH 2    Grade 0.5    Minutes 15    METs 2.65       Recumbant Bike   Level 1    RPM 60    Watts 25    Minutes 15    METs 2.65      NuStep   Level 2    SPM 80    Minutes 15    METs 2.65      Elliptical   Level 1    Speed 2.5    Minutes 15    METs 2.65      T5 Nustep   Level 2    SPM 80  Minutes 15    METs 2.65      Prescription Details   Frequency (times per week) 3    Duration Progress to 30 minutes of continuous aerobic without signs/symptoms of physical distress      Intensity   THRR 40-80% of Max Heartrate 99-131    Ratings of Perceived Exertion 11-15    Perceived Dyspnea 0-4      Resistance Training   Training Prescription Yes    Weight 3 lb    Reps 10-15             Perform Capillary Blood Glucose checks as needed.  Exercise Prescription Changes:   Exercise Prescription Changes     Row Name 04/03/21 1100             Response to Exercise   Blood Pressure (Admit) 138/60       Blood Pressure (Exercise) 154/68       Blood Pressure (Exit) 132/62       Heart Rate (Admit) 68 bpm       Heart Rate (Exercise) 103 bpm       Heart Rate (Exit) 82 bpm       Oxygen Saturation (Admit) 94 %       Oxygen Saturation (Exercise) 88 %       Oxygen Saturation (Exit) 92 %       Rating of Perceived Exertion (Exercise) 13       Perceived Dyspnea (Exercise) 3       Symptoms SOB                Exercise Comments:   Exercise Goals and Review:   Exercise Goals     Row Name 04/03/21 1130             Exercise Goals   Increase Physical Activity Yes       Intervention Provide advice, education, support and counseling about physical activity/exercise needs.;Develop an individualized exercise prescription for aerobic and resistive training based on initial evaluation findings, risk stratification, comorbidities and participant's personal goals.       Expected Outcomes Short Term: Attend rehab on a regular basis to increase amount of physical activity.;Long Term: Add in home exercise to make exercise  part of routine and to increase amount of physical activity.;Long Term: Exercising regularly at least 3-5 days a week.       Increase Strength and Stamina Yes       Intervention Provide advice, education, support and counseling about physical activity/exercise needs.;Develop an individualized exercise prescription for aerobic and resistive training based on initial evaluation findings, risk stratification, comorbidities and participant's personal goals.       Expected Outcomes Short Term: Increase workloads from initial exercise prescription for resistance, speed, and METs.;Short Term: Perform resistance training exercises routinely during rehab and add in resistance training at home;Long Term: Improve cardiorespiratory fitness, muscular endurance and strength as measured by increased METs and functional capacity (6MWT)       Able to understand and use rate of perceived exertion (RPE) scale Yes       Intervention Provide education and explanation on how to use RPE scale       Expected Outcomes Short Term: Able to use RPE daily in rehab to express subjective intensity level;Long Term:  Able to use RPE to guide intensity level when exercising independently       Able to understand and use Dyspnea scale Yes       Intervention Provide education and  explanation on how to use Dyspnea scale       Expected Outcomes Short Term: Able to use Dyspnea scale daily in rehab to express subjective sense of shortness of breath during exertion;Long Term: Able to use Dyspnea scale to guide intensity level when exercising independently       Knowledge and understanding of Target Heart Rate Range (THRR) Yes       Intervention Provide education and explanation of THRR including how the numbers were predicted and where they are located for reference       Expected Outcomes Short Term: Able to state/look up THRR;Short Term: Able to use daily as guideline for intensity in rehab;Long Term: Able to use THRR to govern intensity when  exercising independently       Able to check pulse independently Yes       Intervention Provide education and demonstration on how to check pulse in carotid and radial arteries.;Review the importance of being able to check your own pulse for safety during independent exercise       Expected Outcomes Short Term: Able to explain why pulse checking is important during independent exercise;Long Term: Able to check pulse independently and accurately       Understanding of Exercise Prescription Yes       Intervention Provide education, explanation, and written materials on patient's individual exercise prescription       Expected Outcomes Short Term: Able to explain program exercise prescription;Long Term: Able to explain home exercise prescription to exercise independently                Exercise Goals Re-Evaluation :   Discharge Exercise Prescription (Final Exercise Prescription Changes):  Exercise Prescription Changes - 04/03/21 1100       Response to Exercise   Blood Pressure (Admit) 138/60    Blood Pressure (Exercise) 154/68    Blood Pressure (Exit) 132/62    Heart Rate (Admit) 68 bpm    Heart Rate (Exercise) 103 bpm    Heart Rate (Exit) 82 bpm    Oxygen Saturation (Admit) 94 %    Oxygen Saturation (Exercise) 88 %    Oxygen Saturation (Exit) 92 %    Rating of Perceived Exertion (Exercise) 13    Perceived Dyspnea (Exercise) 3    Symptoms SOB             Nutrition:  Target Goals: Understanding of nutrition guidelines, daily intake of sodium 1500mg , cholesterol 200mg , calories 30% from fat and 7% or less from saturated fats, daily to have 5 or more servings of fruits and vegetables.  Education: All About Nutrition: -Group instruction provided by verbal, written material, interactive activities, discussions, models, and posters to present general guidelines for heart healthy nutrition including fat, fiber, MyPlate, the role of sodium in heart healthy nutrition, utilization  of the nutrition label, and utilization of this knowledge for meal planning. Follow up email sent as well. Written material given at graduation.   Biometrics:  Pre Biometrics - 04/03/21 1131       Pre Biometrics   Height 5' 11.25" (1.81 m)    Weight 266 lb 11.2 oz (121 kg)    BMI (Calculated) 36.93    Single Leg Stand 4.3 seconds              Nutrition Therapy Plan and Nutrition Goals:  Nutrition Therapy & Goals - 03/20/21 1127       Intervention Plan   Intervention Prescribe, educate and counsel regarding individualized specific dietary modifications  aiming towards targeted core components such as weight, hypertension, lipid management, diabetes, heart failure and other comorbidities.    Expected Outcomes Short Term Goal: A plan has been developed with personal nutrition goals set during dietitian appointment.;Short Term Goal: Understand basic principles of dietary content, such as calories, fat, sodium, cholesterol and nutrients.;Long Term Goal: Adherence to prescribed nutrition plan.             Nutrition Assessments:  MEDIFICTS Score Key: ?70 Need to make dietary changes  40-70 Heart Healthy Diet ? 40 Therapeutic Level Cholesterol Diet   Picture Your Plate Scores: [id number] Unhealthy dietary pattern with much room for improvement. 41-50 Dietary pattern unlikely to meet recommendations for good health and room for improvement. 51-60 More healthful dietary pattern, with some room for improvement.  >60 Healthy dietary pattern, although there may be some specific behaviors that could be improved.   Nutrition Goals Re-Evaluation:   Nutrition Goals Discharge (Final Nutrition Goals Re-Evaluation):   Psychosocial: Target Goals: Acknowledge presence or absence of significant depression and/or stress, maximize coping skills, provide positive support system. Participant is able to verbalize types and ability to use techniques and skills needed for reducing stress and  depression.   Education: Stress, Anxiety, and Depression - Group verbal and visual presentation to define topics covered.  Reviews how body is impacted by stress, anxiety, and depression.  Also discusses healthy ways to reduce stress and to treat/manage anxiety and depression.  Written material given at graduation.   Education: Sleep Hygiene -Provides group verbal and written instruction about how sleep can affect your health.  Define sleep hygiene, discuss sleep cycles and impact of sleep habits. Review good sleep hygiene tips.    Initial Review & Psychosocial Screening:  Initial Psych Review & Screening - 03/20/21 1125       Initial Review   Current issues with Current Stress Concerns    Source of Stress Concerns Chronic Illness;Unable to participate in former interests or hobbies;Unable to perform yard/household activities    Comments Worries about getting older and slowing down, frustrating at times      Dare? Yes   wife, children (live in [place], 2 kids, 3 grandchildren)   Comments wants to be able to keep up with grandkids      Barriers   Psychosocial barriers to participate in program The patient should benefit from training in stress management and relaxation.;Psychosocial barriers identified (see note)      Screening Interventions   Interventions Encouraged to exercise;To provide support and resources with identified psychosocial needs;Provide feedback about the scores to participant    Expected Outcomes Short Term goal: Utilizing psychosocial counselor, staff and physician to assist with identification of specific Stressors or current issues interfering with healing process. Setting desired goal for each stressor or current issue identified.;Long Term Goal: Stressors or current issues are controlled or eliminated.;Short Term goal: Identification and review with participant of any Quality of Life or Depression concerns found by scoring the  questionnaire.;Long Term goal: The participant improves quality of Life and PHQ9 Scores as seen by post scores and/or verbalization of changes             Quality of Life Scores:  Scores of 19 and below usually indicate a poorer quality of life in these areas.  A difference of  2-3 points is a clinically meaningful difference.  A difference of 2-3 points in the total score of the Quality of Life Index has been associated  with significant improvement in overall quality of life, self-image, physical symptoms, and general health in studies assessing change in quality of life.  PHQ-9: Recent Review Flowsheet Data     Depression screen [facility] 2/9 04/03/2021   Decreased Interest 0   Down, Depressed, Hopeless 0   PHQ - 2 Score 0   Altered sleeping 2   Tired, decreased energy 1   Change in appetite 1   Feeling bad or failure about yourself  1   Trouble concentrating 0   Suicidal thoughts 0   PHQ-9 Score 5   Difficult doing work/chores Not difficult at all      Interpretation of Total Score  Total Score Depression Severity:  1-4 = Minimal depression, 5-9 = Mild depression, 10-14 = Moderate depression, 15-19 = Moderately severe depression, 20-27 = Severe depression   Psychosocial Evaluation and Intervention:  Psychosocial Evaluation - 03/20/21 1135       Psychosocial Evaluation & Interventions   Interventions Stress management education;Encouraged to exercise with the program and follow exercise prescription    Comments Zachary Guerrero is coming to pulmonoary rehab for his COPD.  He has a history of smoking but quit in 2012.  His wife and family are his biggest supporters.  He has two kids that live in [place] and 3 grandkids that he wants to be able to keep up with and being around for.  His biggest frustrations are not being to do what he needs to and wants to do.  He used to be able to hike for 3 miles with his wife each week, but no longer able to go that long.  He really wants to get his  health better overall and have mroe energy.  He is also aiming to lose weight with a goal of 20 lbs.  He is also scheduled for cataract surgery in March and hoping that will help him be able to see better again.  He is very excited about the program and looking forward to working towards feeling better again.             Psychosocial Re-Evaluation:   Psychosocial Discharge (Final Psychosocial Re-Evaluation):   Education: Education Goals: Education classes will be provided on a weekly basis, covering required topics. Participant will state understanding/return demonstration of topics presented.  Learning Barriers/Preferences:  Learning Barriers/Preferences - 03/20/21 1124       Learning Barriers/Preferences   Learning Barriers Sight   needs cataract surgery (March)   Learning Preferences None             General Pulmonary Education Topics:  Infection Prevention: - Provides verbal and written material to individual with discussion of infection control including proper hand washing and proper equipment cleaning during exercise session. Flowsheet Row Pulmonary Rehab from 04/03/2021 in [facility] Cardiac and Pulmonary Rehab  Date 04/03/21  Educator AS  Instruction Review Code 1- Verbalizes Understanding       Falls Prevention: - Provides verbal and written material to individual with discussion of falls prevention and safety. Flowsheet Row Pulmonary Rehab from 04/03/2021 in [facility] Cardiac and Pulmonary Rehab  Date 04/03/21  Educator AS  Instruction Review Code 1- Verbalizes Understanding       Chronic Lung Disease Review: - Group verbal instruction with posters, models, PowerPoint presentations and videos,  to review new updates, new respiratory medications, new advancements in procedures and treatments. Providing information on websites and "800" numbers for continued self-education. Includes information about supplement oxygen, available portable oxygen systems, continuous and  intermittent  flow rates, oxygen safety, concentrators, and Medicare reimbursement for oxygen. Explanation of Pulmonary Drugs, including class, frequency, complications, importance of spacers, rinsing mouth after steroid MDI's, and proper cleaning methods for nebulizers. Review of basic lung anatomy and physiology related to function, structure, and complications of lung disease. Review of risk factors. Discussion about methods for diagnosing sleep apnea and types of masks and machines for OSA. Includes a review of the use of types of environmental controls: home humidity, furnaces, filters, dust mite/pet prevention, HEPA vacuums. Discussion about weather changes, air quality and the benefits of nasal washing. Instruction on Warning signs, infection symptoms, calling MD promptly, preventive modes, and value of vaccinations. Review of effective airway clearance, coughing and/or vibration techniques. Emphasizing that all should Create an Action Plan. Written material given at graduation.   AED/CPR: - Group verbal and written instruction with the use of models to demonstrate the basic use of the AED with the basic ABC's of resuscitation.    Anatomy and Cardiac Procedures: - Group verbal and visual presentation and models provide information about basic cardiac anatomy and function. Reviews the testing methods done to diagnose heart disease and the outcomes of the test results. Describes the treatment choices: Medical Management, Angioplasty, or Coronary Bypass Surgery for treating various heart conditions including Myocardial Infarction, Angina, Valve Disease, and Cardiac Arrhythmias.  Written material given at graduation.   Medication Safety: - Group verbal and visual instruction to review commonly prescribed medications for heart and lung disease. Reviews the medication, class of the drug, and side effects. Includes the steps to properly store meds and maintain the prescription regimen.  Written material  given at graduation.   Other: -Provides group and verbal instruction on various topics (see comments)   Knowledge Questionnaire Score:  Knowledge Questionnaire Score - 04/03/21 1135       Knowledge Questionnaire Score   Pre Score 16/18              Core Components/Risk Factors/Patient Goals at Admission:  Personal Goals and Risk Factors at Admission - 03/20/21 1127       Core Components/Risk Factors/Patient Goals on Admission    Weight Management Yes;Obesity;Weight Loss    Intervention Weight Management: Develop a combined nutrition and exercise program designed to reach desired caloric intake, while maintaining appropriate intake of nutrient and fiber, sodium and fats, and appropriate energy expenditure required for the weight goal.;Weight Management: Provide education and appropriate resources to help participant work on and attain dietary goals.;Weight Management/Obesity: Establish reasonable short term and long term weight goals.;Obesity: Provide education and appropriate resources to help participant work on and attain dietary goals.    Admit Weight 262 lb (118.8 kg)    Goal Weight: Short Term 255 lb (115.7 kg)    Goal Weight: Long Term 240 lb (108.9 kg)    Expected Outcomes Short Term: Continue to assess and modify interventions until short term weight is achieved;Long Term: Adherence to nutrition and physical activity/exercise program aimed toward attainment of established weight goal;Weight Loss: Understanding of general recommendations for a balanced deficit meal plan, which promotes 1-2 lb weight loss per week and includes a negative energy balance of [phone] kcal/d;Understanding recommendations for meals to include 15-35% energy as protein, 25-35% energy from fat, 35-60% energy from carbohydrates, less than 200mg  of dietary cholesterol, 20-35 gm of total fiber daily;Understanding of distribution of calorie intake throughout the day with the consumption of 4-5 meals/snacks     Improve shortness of breath with ADL's Yes    Intervention  Provide education, individualized exercise plan and daily activity instruction to help decrease symptoms of SOB with activities of daily living.    Expected Outcomes Short Term: Improve cardiorespiratory fitness to achieve a reduction of symptoms when performing ADLs;Long Term: Be able to perform more ADLs without symptoms or delay the onset of symptoms    Increase knowledge of respiratory medications and ability to use respiratory devices properly  Yes    Intervention Provide education and demonstration as needed of appropriate use of medications, inhalers, and oxygen therapy.    Expected Outcomes Short Term: Achieves understanding of medications use. Understands that oxygen is a medication prescribed by physician. Demonstrates appropriate use of inhaler and oxygen therapy.;Long Term: Maintain appropriate use of medications, inhalers, and oxygen therapy.    Diabetes Yes    Intervention Provide education about signs/symptoms and action to take for hypo/hyperglycemia.;Provide education about proper nutrition, including hydration, and aerobic/resistive exercise prescription along with prescribed medications to achieve blood glucose in normal ranges: Fasting glucose 65-99 mg/dL    Expected Outcomes Short Term: Participant verbalizes understanding of the signs/symptoms and immediate care of hyper/hypoglycemia, proper foot care and importance of medication, aerobic/resistive exercise and nutrition plan for blood glucose control.;Long Term: Attainment of HbA1C < 7%.    Hypertension Yes    Intervention Provide education on lifestyle modifcations including regular physical activity/exercise, weight management, moderate sodium restriction and increased consumption of fresh fruit, vegetables, and low fat dairy, alcohol moderation, and smoking cessation.;Monitor prescription use compliance.    Expected Outcomes Short Term: Continued assessment and  intervention until BP is < 140/32mm HG in hypertensive participants. < 130/55mm HG in hypertensive participants with diabetes, heart failure or chronic kidney disease.;Long Term: Maintenance of blood pressure at goal levels.    Lipids Yes    Intervention Provide education and support for participant on nutrition & aerobic/resistive exercise along with prescribed medications to achieve LDL 70mg , HDL >40mg .    Expected Outcomes Short Term: Participant states understanding of desired cholesterol values and is compliant with medications prescribed. Participant is following exercise prescription and nutrition guidelines.;Long Term: Cholesterol controlled with medications as prescribed, with individualized exercise RX and with personalized nutrition plan. Value goals: LDL < 70mg , HDL > 40 mg.             Education:Diabetes - Individual verbal and written instruction to review signs/symptoms of diabetes, desired ranges of glucose level fasting, after meals and with exercise. Acknowledge that pre and post exercise glucose checks will be done for 3 sessions at entry of program. Flowsheet Row Pulmonary Rehab from 03/20/2021 in [facility] Cardiac and Pulmonary Rehab  Date 03/20/21  Educator [facility]  Instruction Review Code 1- Verbalizes Understanding       Know Your Numbers and Heart Failure: - Group verbal and visual instruction to discuss disease risk factors for cardiac and pulmonary disease and treatment options.  Reviews associated critical values for Overweight/Obesity, Hypertension, Cholesterol, and Diabetes.  Discusses basics of heart failure: signs/symptoms and treatments.  Introduces Heart Failure Zone chart for action plan for heart failure.  Written material given at graduation.   Core Components/Risk Factors/Patient Goals Review:    Core Components/Risk Factors/Patient Goals at Discharge (Final Review):    ITP Comments:  ITP Comments     Row Name 03/20/21 1148           ITP Comments  Completed virtual orientation today.  EP evaluation is scheduled for Monday 3/6 at 9am.  Documentation for diagnosis can be found in [facility] encounter 01/03/21.  Comments: initial ITP

## 2021-04-03 NOTE — Patient Instructions (Signed)
Patient Instructions  Patient Details  Name: Zachary Guerrero MRN: [id number] Date of Birth: 1945-09-05 Referring Provider:  Idelle Crouch, MD  Below are your personal goals for exercise, nutrition, and risk factors. Our goal is to help you stay on track towards obtaining and maintaining these goals. We will be discussing your progress on these goals with you throughout the program.  Initial Exercise Prescription:  Initial Exercise Prescription - 04/03/21 1100       Date of Initial Exercise RX and Referring Provider   Date 04/03/21    Referring Provider Sparks      Oxygen   Maintain Oxygen Saturation 88% or higher      Treadmill   MPH 2    Grade 0.5    Minutes 15    METs 2.65      Recumbant Bike   Level 1    RPM 60    Watts 25    Minutes 15    METs 2.65      NuStep   Level 2    SPM 80    Minutes 15    METs 2.65      Elliptical   Level 1    Speed 2.5    Minutes 15    METs 2.65      T5 Nustep   Level 2    SPM 80    Minutes 15    METs 2.65      Prescription Details   Frequency (times per week) 3    Duration Progress to 30 minutes of continuous aerobic without signs/symptoms of physical distress      Intensity   THRR 40-80% of Max Heartrate 99-131    Ratings of Perceived Exertion 11-15    Perceived Dyspnea 0-4      Resistance Training   Training Prescription Yes    Weight 3 lb    Reps 10-15             Exercise Goals: Frequency: Be able to perform aerobic exercise two to three times per week in program working toward 2-5 days per week of home exercise.  Intensity: Work with a perceived exertion of 11 (fairly light) - 15 (hard) while following your exercise prescription.  We will make changes to your prescription with you as you progress through the program.   Duration: Be able to do 30 to 45 minutes of continuous aerobic exercise in addition to a 5 minute warm-up and a 5 minute cool-down routine.   Nutrition Goals: Your personal nutrition  goals will be established when you do your nutrition analysis with the dietician.  The following are general nutrition guidelines to follow: Cholesterol < 200mg /day Sodium < 1500mg /day Fiber: Men over 50 yrs - 30 grams per day  Personal Goals:  Personal Goals and Risk Factors at Admission - 04/03/21 1139       Core Components/Risk Factors/Patient Goals on Admission    Weight Management Yes;Obesity;Weight Loss    Intervention Weight Management: Develop a combined nutrition and exercise program designed to reach desired caloric intake, while maintaining appropriate intake of nutrient and fiber, sodium and fats, and appropriate energy expenditure required for the weight goal.;Weight Management: Provide education and appropriate resources to help participant work on and attain dietary goals.;Weight Management/Obesity: Establish reasonable short term and long term weight goals.;Obesity: Provide education and appropriate resources to help participant work on and attain dietary goals.    Admit Weight 266 lb 11.2 oz (121 kg)    Goal Weight:  Short Term 255 lb (115.7 kg)    Goal Weight: Long Term 240 lb (108.9 kg)    Expected Outcomes Short Term: Continue to assess and modify interventions until short term weight is achieved;Long Term: Adherence to nutrition and physical activity/exercise program aimed toward attainment of established weight goal;Weight Loss: Understanding of general recommendations for a balanced deficit meal plan, which promotes 1-2 lb weight loss per week and includes a negative energy balance of [phone] kcal/d;Understanding recommendations for meals to include 15-35% energy as protein, 25-35% energy from fat, 35-60% energy from carbohydrates, less than 200mg  of dietary cholesterol, 20-35 gm of total fiber daily;Understanding of distribution of calorie intake throughout the day with the consumption of 4-5 meals/snacks    Improve shortness of breath with ADL's Yes    Intervention Provide  education, individualized exercise plan and daily activity instruction to help decrease symptoms of SOB with activities of daily living.    Expected Outcomes Short Term: Improve cardiorespiratory fitness to achieve a reduction of symptoms when performing ADLs;Long Term: Be able to perform more ADLs without symptoms or delay the onset of symptoms    Increase knowledge of respiratory medications and ability to use respiratory devices properly  Yes    Intervention Provide education and demonstration as needed of appropriate use of medications, inhalers, and oxygen therapy.    Expected Outcomes Short Term: Achieves understanding of medications use. Understands that oxygen is a medication prescribed by physician. Demonstrates appropriate use of inhaler and oxygen therapy.;Long Term: Maintain appropriate use of medications, inhalers, and oxygen therapy.    Diabetes Yes    Intervention Provide education about signs/symptoms and action to take for hypo/hyperglycemia.;Provide education about proper nutrition, including hydration, and aerobic/resistive exercise prescription along with prescribed medications to achieve blood glucose in normal ranges: Fasting glucose 65-99 mg/dL    Expected Outcomes Short Term: Participant verbalizes understanding of the signs/symptoms and immediate care of hyper/hypoglycemia, proper foot care and importance of medication, aerobic/resistive exercise and nutrition plan for blood glucose control.;Long Term: Attainment of HbA1C < 7%.    Hypertension Yes    Intervention Provide education on lifestyle modifcations including regular physical activity/exercise, weight management, moderate sodium restriction and increased consumption of fresh fruit, vegetables, and low fat dairy, alcohol moderation, and smoking cessation.;Monitor prescription use compliance.    Expected Outcomes Short Term: Continued assessment and intervention until BP is < 140/1890mm HG in hypertensive participants. <  130/980mm HG in hypertensive participants with diabetes, heart failure or chronic kidney disease.;Long Term: Maintenance of blood pressure at goal levels.    Lipids Yes    Intervention Provide education and support for participant on nutrition & aerobic/resistive exercise along with prescribed medications to achieve LDL 70mg , HDL >40mg .    Expected Outcomes Short Term: Participant states understanding of desired cholesterol values and is compliant with medications prescribed. Participant is following exercise prescription and nutrition guidelines.;Long Term: Cholesterol controlled with medications as prescribed, with individualized exercise RX and with personalized nutrition plan. Value goals: LDL < 70mg , HDL > 40 mg.             Tobacco Use Initial Evaluation: Social History   Tobacco Use  Smoking Status Former   Packs/day: 2.00   Years: 20.00   Pack years: 40.00   Types: Cigarettes   Quit date: 03/31/2010   Years since quitting: 11.0  Smokeless Tobacco Former    Exercise Goals and Review:  Exercise Goals     Row Name 04/03/21 1130  Exercise Goals   Increase Physical Activity Yes       Intervention Provide advice, education, support and counseling about physical activity/exercise needs.;Develop an individualized exercise prescription for aerobic and resistive training based on initial evaluation findings, risk stratification, comorbidities and participant's personal goals.       Expected Outcomes Short Term: Attend rehab on a regular basis to increase amount of physical activity.;Long Term: Add in home exercise to make exercise part of routine and to increase amount of physical activity.;Long Term: Exercising regularly at least 3-5 days a week.       Increase Strength and Stamina Yes       Intervention Provide advice, education, support and counseling about physical activity/exercise needs.;Develop an individualized exercise prescription for aerobic and resistive  training based on initial evaluation findings, risk stratification, comorbidities and participant's personal goals.       Expected Outcomes Short Term: Increase workloads from initial exercise prescription for resistance, speed, and METs.;Short Term: Perform resistance training exercises routinely during rehab and add in resistance training at home;Long Term: Improve cardiorespiratory fitness, muscular endurance and strength as measured by increased METs and functional capacity (6MWT)       Able to understand and use rate of perceived exertion (RPE) scale Yes       Intervention Provide education and explanation on how to use RPE scale       Expected Outcomes Short Term: Able to use RPE daily in rehab to express subjective intensity level;Long Term:  Able to use RPE to guide intensity level when exercising independently       Able to understand and use Dyspnea scale Yes       Intervention Provide education and explanation on how to use Dyspnea scale       Expected Outcomes Short Term: Able to use Dyspnea scale daily in rehab to express subjective sense of shortness of breath during exertion;Long Term: Able to use Dyspnea scale to guide intensity level when exercising independently       Knowledge and understanding of Target Heart Rate Range (THRR) Yes       Intervention Provide education and explanation of THRR including how the numbers were predicted and where they are located for reference       Expected Outcomes Short Term: Able to state/look up THRR;Short Term: Able to use daily as guideline for intensity in rehab;Long Term: Able to use THRR to govern intensity when exercising independently       Able to check pulse independently Yes       Intervention Provide education and demonstration on how to check pulse in carotid and radial arteries.;Review the importance of being able to check your own pulse for safety during independent exercise       Expected Outcomes Short Term: Able to explain why pulse  checking is important during independent exercise;Long Term: Able to check pulse independently and accurately       Understanding of Exercise Prescription Yes       Intervention Provide education, explanation, and written materials on patient's individual exercise prescription       Expected Outcomes Short Term: Able to explain program exercise prescription;Long Term: Able to explain home exercise prescription to exercise independently                Copy of goals given to participant.

## 2021-04-04 ENCOUNTER — Other Ambulatory Visit: Payer: Self-pay

## 2021-04-04 DIAGNOSIS — J449 Chronic obstructive pulmonary disease, unspecified: Secondary | ICD-10-CM | POA: Diagnosis not present

## 2021-04-04 LAB — GLUCOSE, CAPILLARY
Glucose-Capillary: 131 mg/dL — ABNORMAL HIGH (ref 70–99)
Glucose-Capillary: 117 mg/dL — ABNORMAL HIGH (ref 70–99)

## 2021-04-04 NOTE — Progress Notes (Signed)
Daily Session Note ? ?Patient Details  ?Name: Zachary Guerrero ?MRN: [id number] ?Date of Birth: 23-Mar-1945 ?Referring Provider:   ?Flowsheet Row Pulmonary Rehab from 04/03/2021 in [facility] Cardiac and Pulmonary Rehab  ?Referring Provider Sparks  ? ?  ? ? ?Encounter Date: 04/04/2021 ? ?Check In: ? Session Check In - 04/04/21 0914   ? ?  ? Check-In  ? Supervising physician immediately available to respond to emergencies See telemetry face sheet for immediately available ER MD   ? Location [facility]-Cardiac & Pulmonary Rehab   ? Staff Present Birdie Sons, MPA, RN;Amanda Sommer, BA, ACSM CEP, Exercise Physiologist;Jessica Luan Pulling, MA, RCEP, CCRP, CCET   ? Virtual Visit No   ? Medication changes reported     No   ? Fall or balance concerns reported    No   ? Warm-up and Cool-down Performed on first and last piece of equipment   ? Resistance Training Performed Yes   ? VAD Patient? No   ? PAD/SET Patient? No   ?  ? Pain Assessment  ? Currently in Pain? No/denies   ? ?  ?  ? ?  ? ? ? ? ? ?Social History  ? ?Tobacco Use  ?Smoking Status Former  ? Packs/day: 2.00  ? Years: 20.00  ? Pack years: 40.00  ? Types: Cigarettes  ? Quit date: 03/31/2010  ? Years since quitting: 11.0  ?Smokeless Tobacco Former  ? ? ?Goals Met:  ?Independence with exercise equipment ?Exercise tolerated well ?No report of concerns or symptoms today ?Strength training completed today ? ?Goals Unmet:  ?Not Applicable ? ?Comments: First full day of exercise!  Patient was oriented to gym and equipment including functions, settings, policies, and procedures.  Patient's individual exercise prescription and treatment plan were reviewed.  All starting workloads were established based on the results of the 6 minute walk test done at initial orientation visit.  The plan for exercise progression was also introduced and progression will be customized based on patient's performance and goals. ? ? ? ?Dr. Emily Filbert is Medical Director for [facility].  ?Dr.  Ottie Glazier is Medical Director for [facility] Pulmonary Rehabilitation. ?

## 2021-04-06 ENCOUNTER — Other Ambulatory Visit: Payer: Self-pay

## 2021-04-06 DIAGNOSIS — J449 Chronic obstructive pulmonary disease, unspecified: Secondary | ICD-10-CM | POA: Diagnosis not present

## 2021-04-06 LAB — GLUCOSE, CAPILLARY: Glucose-Capillary: 194 mg/dL — ABNORMAL HIGH (ref 70–99)

## 2021-04-06 NOTE — Progress Notes (Signed)
Daily Session Note ? ?Patient Details  ?Name: Zachary Guerrero ?MRN: [id number] ?Date of Birth: January 31, 1945 ?Referring Provider:   ?Flowsheet Row Pulmonary Rehab from 04/03/2021 in [facility] Cardiac and Pulmonary Rehab  ?Referring Provider Sparks  ? ?  ? ? ?Encounter Date: 04/06/2021 ? ?Check In: ? Session Check In - 04/06/21 0923   ? ?  ? Check-In  ? Supervising physician immediately available to respond to emergencies See telemetry face sheet for immediately available ER MD   ? Location [facility]-Cardiac & Pulmonary Rehab   ? Staff Present Birdie Sons, MPA, RN;Jessica Vining, MA, RCEP, CCRP, Marylynn Pearson, MS, ASCM CEP, Exercise Physiologist;Tyheem Boughner Amedeo Plenty, BS, ACSM CEP, Exercise Physiologist;Amanda Oletta Darter, BA, ACSM CEP, Exercise Physiologist   ? Virtual Visit No   ? Medication changes reported     No   ? Fall or balance concerns reported    No   ? Warm-up and Cool-down Performed on first and last piece of equipment   ? Resistance Training Performed Yes   ? VAD Patient? No   ? PAD/SET Patient? No   ?  ? Pain Assessment  ? Currently in Pain? No/denies   ? ?  ?  ? ?  ? ? ? ? ? ?Social History  ? ?Tobacco Use  ?Smoking Status Former  ? Packs/day: 2.00  ? Years: 20.00  ? Pack years: 40.00  ? Types: Cigarettes  ? Quit date: 03/31/2010  ? Years since quitting: 11.0  ?Smokeless Tobacco Former  ? ? ?Goals Met:  ?Independence with exercise equipment ?Exercise tolerated well ?No report of concerns or symptoms today ?Strength training completed today ? ?Goals Unmet:  ?Not Applicable ? ?Comments: Pt able to follow exercise prescription today without complaint.  Will continue to monitor for progression. ? ? ? ?Dr. Emily Filbert is Medical Director for [facility].  ?Dr. Ottie Glazier is Medical Director for [facility] Pulmonary Rehabilitation. ?

## 2021-04-10 ENCOUNTER — Encounter: Payer: Medicare PPO | Admitting: *Deleted

## 2021-04-10 ENCOUNTER — Other Ambulatory Visit: Payer: Self-pay

## 2021-04-10 DIAGNOSIS — J449 Chronic obstructive pulmonary disease, unspecified: Secondary | ICD-10-CM

## 2021-04-10 LAB — GLUCOSE, CAPILLARY: Glucose-Capillary: 158 mg/dL — ABNORMAL HIGH (ref 70–99)

## 2021-04-10 NOTE — Progress Notes (Signed)
Daily Session Note ? ?Patient Details  ?Name: Zachary Guerrero ?MRN: [id number] ?Date of Birth: [birth year] ?Referring Provider:   ?Flowsheet Row Pulmonary Rehab from 04/03/2021 in [facility] Cardiac and Pulmonary Rehab  ?Referring Provider [place]  ? ?  ? ? ?Encounter Date: 04/10/2021 ? ?Check In: ? Session Check In - 04/10/21 0935   ? ?  ? Check-In  ? Supervising physician immediately available to respond to emergencies See telemetry face sheet for immediately available ER MD   ? Location [facility]-Cardiac & Pulmonary Rehab   ? Staff Present Heath Lark, RN, BSN, Laveda Norman, BS, ACSM CEP, Exercise Physiologist;Amanda Sommer, [place], ACSM CEP, Exercise Physiologist   ? Virtual Visit No   ? Medication changes reported     No   ? Fall or balance concerns reported    No   ? Warm-up and Cool-down Performed on first and last piece of equipment   ? Resistance Training Performed Yes   ? VAD Patient? No   ? PAD/SET Patient? No   ?  ? Pain Assessment  ? Currently in Pain? No/denies   ? ?  ?  ? ?  ? ? ? ? ? ?Social History  ? ?Tobacco Use  ?Smoking Status Former  ? Packs/day: 2.00  ? Years: 20.00  ? Pack years: 40.00  ? Types: Cigarettes  ? Quit date: 03/31/2010  ? Years since quitting: 11.0  ?Smokeless Tobacco Former  ? ? ?Goals Met:  ?Proper associated with RPD/PD & O2 Sat ?Exercise tolerated well ?No report of concerns or symptoms today ? ?Goals Unmet:  ?Not Applicable ? ?Comments: Pt able to follow exercise prescription today without complaint.  Will continue to monitor for progression. ? ? ? ?Dr. Emily Filbert is Medical Director for [place].  ?Dr. Ottie Glazier is Medical Director for [facility] Pulmonary Rehabilitation. ?

## 2021-04-11 ENCOUNTER — Other Ambulatory Visit: Payer: Self-pay

## 2021-04-11 DIAGNOSIS — J449 Chronic obstructive pulmonary disease, unspecified: Secondary | ICD-10-CM

## 2021-04-11 NOTE — Progress Notes (Signed)
Daily Session Note ? ?Patient Details  ?Name: Zachary Guerrero ?MRN: [id number] ?Date of Birth: 12/26/1945 ?Referring Provider:   ?Flowsheet Row Pulmonary Rehab from 04/03/2021 in [facility] Cardiac and Pulmonary Rehab  ?Referring Provider [place]  ? ?  ? ? ?Encounter Date: 04/11/2021 ? ?Check In: ? Session Check In - 04/11/21 0918   ? ?  ? Check-In  ? Supervising physician immediately available to respond to emergencies See telemetry face sheet for immediately available ER MD   ? Location [facility]-Cardiac & Pulmonary Rehab   ? Staff Present Kelly Bollinger, MPA, RN;Jessica Hawkins, MA, RCEP, CCRP, CCET;Amanda Sommer, BA, ACSM CEP, Exercise Physiologist   ? Virtual Visit No   ? Medication changes reported     No   ? Fall or balance concerns reported    No   ? Warm-up and Cool-down Performed on first and last piece of equipment   ? Resistance Training Performed Yes   ? VAD Patient? No   ? PAD/SET Patient? No   ?  ? Pain Assessment  ? Currently in Pain? No/denies   ? ?  ?  ? ?  ? ? ? ? ? ?Social History  ? ?Tobacco Use  ?Smoking Status Former  ? Packs/day: 2.00  ? Years: 20.00  ? Pack years: 40.00  ? Types: Cigarettes  ? Quit date: 03/31/2010  ? Years since quitting: 11.0  ?Smokeless Tobacco Former  ? ? ?Goals Met:  ?Independence with exercise equipment ?Exercise tolerated well ?No report of concerns or symptoms today ?Strength training completed today ? ?Goals Unmet:  ?Not Applicable ? ?Comments: Pt able to follow exercise prescription today without complaint.  Will continue to monitor for progression. ? ? ? ?Dr. Mark Miller is Medical Director for [facility].  ?Dr. Fuad Aleskerov is Medical Director for [facility] Pulmonary Rehabilitation. ?

## 2021-04-12 ENCOUNTER — Encounter: Payer: Self-pay | Admitting: Ophthalmology

## 2021-04-12 NOTE — Discharge Instructions (Signed)

## 2021-04-13 ENCOUNTER — Other Ambulatory Visit: Payer: Self-pay

## 2021-04-13 DIAGNOSIS — J449 Chronic obstructive pulmonary disease, unspecified: Secondary | ICD-10-CM | POA: Diagnosis not present

## 2021-04-13 NOTE — Progress Notes (Signed)
Daily Session Note ? ?Patient Details  ?Name: Zachary Guerrero ?MRN: [id number] ?Date of Birth: Jul 28, 1945 ?Referring Provider:   ?Flowsheet Row Pulmonary Rehab from 04/03/2021 in [facility] Cardiac and Pulmonary Rehab  ?Referring Provider [place]  ? ?  ? ? ?Encounter Date: 04/13/2021 ? ?Check In: ? Session Check In - 04/13/21 0921   ? ?  ? Check-In  ? Supervising physician immediately available to respond to emergencies See telemetry face sheet for immediately available ER MD   ? Location [facility]-Cardiac & Pulmonary Rehab   ? Staff Present Birdie Sons, MPA, RN;Joseph Amalga, RCP,RRT,BSRT;Marinna Blane [place], BS, ACSM CEP, Exercise Physiologist;Jessica [place], MA, RCEP, CCRP, CCET   ? Virtual Visit No   ? Medication changes reported     No   ? Fall or balance concerns reported    No   ? Warm-up and Cool-down Performed on first and last piece of equipment   ? Resistance Training Performed Yes   ? VAD Patient? No   ? PAD/SET Patient? No   ?  ? Pain Assessment  ? Currently in Pain? No/denies   ? ?  ?  ? ?  ? ? ? ? ? ?Social History  ? ?Tobacco Use  ?Smoking Status Former  ? Packs/day: 2.00  ? Years: 20.00  ? Pack years: 40.00  ? Types: Cigarettes  ? Quit date: 03/31/2010  ? Years since quitting: 11.0  ?Smokeless Tobacco Former  ? ? ?Goals Met:  ?Independence with exercise equipment ?Exercise tolerated well ?No report of concerns or symptoms today ?Strength training completed today ? ?Goals Unmet:  ?Not Applicable ? ?Comments: Pt able to follow exercise prescription today without complaint.  Will continue to monitor for progression. ? ? ? ?Dr. Emily Filbert is Medical Director for [place].  ?Dr. Ottie Glazier is Medical Director for [facility] Pulmonary Rehabilitation. ?

## 2021-04-17 ENCOUNTER — Encounter: Payer: Medicare PPO | Admitting: *Deleted

## 2021-04-17 ENCOUNTER — Other Ambulatory Visit: Payer: Self-pay

## 2021-04-17 DIAGNOSIS — J449 Chronic obstructive pulmonary disease, unspecified: Secondary | ICD-10-CM | POA: Diagnosis not present

## 2021-04-17 NOTE — Progress Notes (Signed)
Daily Session Note ? ?Patient Details  ?Name: Zachary Guerrero ?MRN: [id number] ?Date of Birth: 1946/01/11 ?Referring Provider:   ?Flowsheet Row Pulmonary Rehab from 04/03/2021 in [facility] Cardiac and Pulmonary Rehab  ?Referring Provider [place]  ? ?  ? ? ?Encounter Date: 04/17/2021 ? ?Check In: ? Session Check In - 04/17/21 1126   ? ?  ? Check-In  ? Supervising physician immediately available to respond to emergencies See telemetry face sheet for immediately available ER MD   ? Location [facility]-Cardiac & Pulmonary Rehab   ? Staff Present Heath Lark, RN, BSN, CCRP;Jessica [place], MA, RCEP, CCRP, [place], BS, ACSM CEP, Exercise Physiologist   ? Virtual Visit No   ? Medication changes reported     No   ? Fall or balance concerns reported    No   ? Warm-up and Cool-down Performed on first and last piece of equipment   ? Resistance Training Performed Yes   ? VAD Patient? No   ? PAD/SET Patient? No   ?  ? Pain Assessment  ? Currently in Pain? No/denies   ? ?  ?  ? ?  ? ? ? ? ? ?Social History  ? ?Tobacco Use  ?Smoking Status Former  ? Packs/day: 2.00  ? Years: 20.00  ? Pack years: 40.00  ? Types: Cigarettes  ? Quit date: 03/31/2010  ? Years since quitting: 11.0  ?Smokeless Tobacco Former  ? ? ?Goals Met:  ?Independence with exercise equipment ?Exercise tolerated well ?No report of concerns or symptoms today ?Strength training completed today ? ?Goals Unmet:  ?Not Applicable ? ?Comments: Pt able to follow exercise prescription today without complaint.  Will continue to monitor for progression. ? ? ? ?Dr. Emily Filbert is Medical Director for [place].  ?Dr. Ottie Glazier is Medical Director for [facility] Pulmonary Rehabilitation. ?

## 2021-04-18 ENCOUNTER — Encounter
Admission: RE | Disposition: A | Discharge status: Home / Self Care | Payer: Self-pay | Source: Home / Self Care | Attending: Ophthalmology

## 2021-04-18 ENCOUNTER — Ambulatory Visit: Payer: Medicare PPO | Admitting: Anesthesiology

## 2021-04-18 ENCOUNTER — Ambulatory Visit
Admission: RE | Admit: 2021-04-18 | Discharge: 2021-04-18 | Disposition: A | Discharge status: Home / Self Care | Payer: Medicare PPO | Attending: Ophthalmology | Admitting: Ophthalmology

## 2021-04-18 DIAGNOSIS — Z7951 Long term (current) use of inhaled steroids: Secondary | ICD-10-CM | POA: Insufficient documentation

## 2021-04-18 DIAGNOSIS — Z79899 Other long term (current) drug therapy: Secondary | ICD-10-CM | POA: Diagnosis not present

## 2021-04-18 DIAGNOSIS — E1136 Type 2 diabetes mellitus with diabetic cataract: Secondary | ICD-10-CM | POA: Diagnosis not present

## 2021-04-18 DIAGNOSIS — H2512 Age-related nuclear cataract, left eye: Secondary | ICD-10-CM | POA: Insufficient documentation

## 2021-04-18 DIAGNOSIS — Z87891 Personal history of nicotine dependence: Secondary | ICD-10-CM | POA: Diagnosis not present

## 2021-04-18 DIAGNOSIS — N189 Chronic kidney disease, unspecified: Secondary | ICD-10-CM | POA: Insufficient documentation

## 2021-04-18 DIAGNOSIS — E669 Obesity, unspecified: Secondary | ICD-10-CM | POA: Diagnosis not present

## 2021-04-18 DIAGNOSIS — Z7901 Long term (current) use of anticoagulants: Secondary | ICD-10-CM | POA: Diagnosis not present

## 2021-04-18 DIAGNOSIS — J449 Chronic obstructive pulmonary disease, unspecified: Secondary | ICD-10-CM | POA: Diagnosis not present

## 2021-04-18 DIAGNOSIS — E1122 Type 2 diabetes mellitus with diabetic chronic kidney disease: Secondary | ICD-10-CM | POA: Diagnosis not present

## 2021-04-18 DIAGNOSIS — Z951 Presence of aortocoronary bypass graft: Secondary | ICD-10-CM | POA: Insufficient documentation

## 2021-04-18 DIAGNOSIS — Z7984 Long term (current) use of oral hypoglycemic drugs: Secondary | ICD-10-CM | POA: Insufficient documentation

## 2021-04-18 DIAGNOSIS — Z6837 Body mass index (BMI) 37.0-37.9, adult: Secondary | ICD-10-CM | POA: Diagnosis not present

## 2021-04-18 DIAGNOSIS — I4891 Unspecified atrial fibrillation: Secondary | ICD-10-CM | POA: Diagnosis not present

## 2021-04-18 DIAGNOSIS — I2510 Atherosclerotic heart disease of native coronary artery without angina pectoris: Secondary | ICD-10-CM | POA: Insufficient documentation

## 2021-04-18 DIAGNOSIS — Z791 Long term (current) use of non-steroidal anti-inflammatories (NSAID): Secondary | ICD-10-CM | POA: Diagnosis not present

## 2021-04-18 DIAGNOSIS — Z7952 Long term (current) use of systemic steroids: Secondary | ICD-10-CM | POA: Insufficient documentation

## 2021-04-18 DIAGNOSIS — G4730 Sleep apnea, unspecified: Secondary | ICD-10-CM | POA: Insufficient documentation

## 2021-04-18 DIAGNOSIS — I129 Hypertensive chronic kidney disease with stage 1 through stage 4 chronic kidney disease, or unspecified chronic kidney disease: Secondary | ICD-10-CM | POA: Insufficient documentation

## 2021-04-18 DIAGNOSIS — Z794 Long term (current) use of insulin: Secondary | ICD-10-CM | POA: Diagnosis not present

## 2021-04-18 HISTORY — PX: CATARACT EXTRACTION W/PHACO: SHX586

## 2021-04-18 LAB — GLUCOSE, CAPILLARY
Glucose-Capillary: 92 mg/dL (ref 70–99)
Glucose-Capillary: 89 mg/dL (ref 70–99)

## 2021-04-18 SURGERY — PHACOEMULSIFICATION, CATARACT, WITH IOL INSERTION
Anesthesia: Monitor Anesthesia Care | Site: Eye | Laterality: Left

## 2021-04-18 MED ORDER — ACETAMINOPHEN 325 MG PO TABS: 325.0000 mg | ORAL_TABLET | ORAL | Status: DC | PRN

## 2021-04-18 MED ORDER — TETRACAINE HCL 0.5 % OP SOLN
1.0000 [drp] | OPHTHALMIC | Status: DC | PRN
  Administered 2021-04-18 (×3): 1 [drp] via OPHTHALMIC

## 2021-04-18 MED ORDER — SIGHTPATH DOSE#1 BSS IO SOLN
INTRAOCULAR | Status: DC | PRN
  Administered 2021-04-18: 73 mL via OPHTHALMIC

## 2021-04-18 MED ORDER — ACETAMINOPHEN 160 MG/5ML PO SOLN: 325.0000 mg | ORAL | Status: DC | PRN

## 2021-04-18 MED ORDER — FENTANYL CITRATE (PF) 100 MCG/2ML IJ SOLN
INTRAMUSCULAR | Status: DC | PRN
  Administered 2021-04-18: 50 ug via INTRAVENOUS

## 2021-04-18 MED ORDER — SIGHTPATH DOSE#1 BSS IO SOLN
INTRAOCULAR | Status: DC | PRN
  Administered 2021-04-18: 2 mL

## 2021-04-18 MED ORDER — MIDAZOLAM HCL 2 MG/2ML IJ SOLN
INTRAMUSCULAR | Status: DC | PRN
  Administered 2021-04-18: 1 mg via INTRAVENOUS

## 2021-04-18 MED ORDER — ONDANSETRON HCL 4 MG/2ML IJ SOLN: 4.0000 mg | Freq: Once | INTRAMUSCULAR | Status: DC | PRN

## 2021-04-18 MED ORDER — SIGHTPATH DOSE#1 BSS IO SOLN
INTRAOCULAR | Status: DC | PRN
  Administered 2021-04-18: 15 mL via INTRAOCULAR

## 2021-04-18 MED ORDER — SIGHTPATH DOSE#1 NA CHONDROIT SULF-NA HYALURON 40-17 MG/ML IO SOLN
INTRAOCULAR | Status: DC | PRN
  Administered 2021-04-18: 1 mL via INTRAOCULAR

## 2021-04-18 MED ORDER — MOXIFLOXACIN HCL 0.5 % OP SOLN
OPHTHALMIC | Status: DC | PRN
  Administered 2021-04-18: 0.2 mL via OPHTHALMIC

## 2021-04-18 MED ORDER — ARMC OPHTHALMIC DILATING DROPS
1.0000 "application " | OPHTHALMIC | Status: DC | PRN
  Administered 2021-04-18 (×3): 1 via OPHTHALMIC

## 2021-04-18 MED ORDER — BRIMONIDINE TARTRATE-TIMOLOL 0.2-0.5 % OP SOLN
OPHTHALMIC | Status: DC | PRN
  Administered 2021-04-18: 1 [drp] via OPHTHALMIC

## 2021-04-18 SURGICAL SUPPLY — 12 items
CANNULA ANT/CHMB 27G (MISCELLANEOUS) IMPLANT
CANNULA ANT/CHMB 27GA (MISCELLANEOUS) IMPLANT
CATARACT SUITE SIGHTPATH (MISCELLANEOUS) ×2 IMPLANT
FEE CATARACT SUITE SIGHTPATH (MISCELLANEOUS) ×2 IMPLANT
GLOVE SURG ENC TEXT LTX SZ8 (GLOVE) ×3 IMPLANT
GLOVE SURG TRIUMPH 8.0 PF LTX (GLOVE) ×3 IMPLANT
LENS IOL TECNIS EYHANCE 22.0 (Intraocular Lens) ×1 IMPLANT
NDL FILTER BLUNT 18X1 1/2 (NEEDLE) ×2 IMPLANT
NEEDLE FILTER BLUNT 18X 1/2SAF (NEEDLE) ×1
NEEDLE FILTER BLUNT 18X1 1/2 (NEEDLE) ×1 IMPLANT
SYR 3ML LL SCALE MARK (SYRINGE) ×3 IMPLANT
WATER STERILE IRR 250ML POUR (IV SOLUTION) ×3 IMPLANT

## 2021-04-18 NOTE — Anesthesia Preprocedure Evaluation (Signed)
Anesthesia Evaluation  ?Patient identified by MRN, date of birth, ID band ?Patient awake ? ? ? ?Reviewed: ?Allergy & Precautions, NPO status  ? ?Airway ?Mallampati: II ? ?TM Distance: >3 FB ? ? ? ? Dental ?  ?Pulmonary ?sleep apnea , COPD, former smoker,  ?  ?Pulmonary exam normal ? ? ? ? ? ? ? Cardiovascular ?hypertension, + dysrhythmias Atrial Fibrillation  ?Rhythm:Regular Rate:Normal ? ?Last cardiology appointment 09/2020 - taking amiodarone and metoprolol for rate control, Pradaxa for anticoagulation.  ?  ?Neuro/Psych ?  ? GI/Hepatic ?  ?Endo/Other  ?diabetesObesity - BMI > 30 ? Renal/GU ?Renal InsufficiencyRenal disease  ? ?  ?Musculoskeletal ? ?(+) Arthritis ,  ? Abdominal ?  ?Peds ? Hematology ?  ?Anesthesia Other Findings ? ? Reproductive/Obstetrics ? ?  ? ? ? ? ? ? ? ? ? ? ? ? ? ?  ?  ? ? ? ? ? ? ? ? ?Anesthesia Physical ?Anesthesia Plan ? ?ASA: 3 ? ?Anesthesia Plan: MAC  ? ?Post-op Pain Management: Minimal or no pain anticipated  ? ?Induction: Intravenous ? ?PONV Risk Score and Plan: TIVA, Midazolam and Treatment may vary due to age or medical condition ? ?Airway Management Planned: Natural Airway and Nasal Cannula ? ?Additional Equipment:  ? ?Intra-op Plan:  ? ?Post-operative Plan:  ? ?Informed Consent: I have reviewed the patients History and Physical, chart, labs and discussed the procedure including the risks, benefits and alternatives for the proposed anesthesia with the patient or authorized representative who has indicated his/her understanding and acceptance.  ? ? ? ? ? ?Plan Discussed with: CRNA ? ?Anesthesia Plan Comments:   ? ? ? ? ? ? ?Anesthesia Quick Evaluation ? ?

## 2021-04-18 NOTE — H&P (Signed)
[facility]  ? ?Primary Care Physician:  Marguarite Arbour, MD ?Ophthalmologist: Dr. Delsa Grana ? ?Pre-Procedure History & Physical: ?HPI:  Zachary Guerrero is a 76 y.o. male here for cataract surgery. ?  ?Past Medical History:  ?Diagnosis Date  ? 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 Chronic kidney disease   ? KIDNEY STONES  ? COPD (chronic obstructive pulmonary disease) (HCC)   ? Coronary artery disease   ? Diabetes mellitus without complication (HCC)   ? Dysrhythmia   ? A-FIB/DR [name]  ? Hypercholesteremia   ? Hypertension   ? CONTROLLED ON MEDS  ? Pneumonia   ? IN PAST  ? Reiter's syndrome   ? Shortness of breath dyspnea   ? WITH EXERTION  ? Sleep apnea   ? C-PAP  ? Wears dentures   ? UPPERS  ? ? ?Past Surgical History:  ?Procedure Laterality Date  ? ARM SURGERY    ? FRACTURED  ? BROW LIFT Bilateral 01/04/2015  ? Procedure: BLEPHAROPLASTY;  Surgeon: Imagene Riches, MD;  Location: [facility];  Service: Ophthalmology;  Laterality: Bilateral;  DIABETIC-INSULIN AND ORAL MEDS/CPAP  ? CARDIAC CATHETERIZATION    ? CARDIAC SURGERY    ? CHOLECYSTECTOMY    ? COLONOSCOPY WITH PROPOFOL N/A 12/26/2016  ? Procedure: COLONOSCOPY WITH PROPOFOL;  Surgeon: Toledo, Boykin Nearing, MD;  Location: [facility] ENDOSCOPY;  Service: Gastroenterology;  Laterality: N/A;  ? CORONARY ARTERY BYPASS GRAFT    ? X4  ? ECTROPION REPAIR Bilateral 01/04/2015  ? Procedure: REPAIR OF ECTROPION EXTENSIVE;  Surgeon: Imagene Riches, MD;  Location: [facility];  Service: Ophthalmology;  Laterality: Bilateral;  ? ESOPHAGOGASTRODUODENOSCOPY (EGD) WITH PROPOFOL N/A 12/26/2016  ? Procedure: ESOPHAGOGASTRODUODENOSCOPY (EGD) WITH PROPOFOL;  Surgeon: Toledo, Boykin Nearing, MD;  Location: [facility] END[facility];  Service: Gastroenterology;  Laterality: N/A;  ? ESOPHAGOGASTRODUODENOSCOPY (EGD) WITH PROPOFOL N/A 03/30/2020  ? Procedure: ESOPHAGOGASTRODUODENOSCOPY (EGD) WITH PROPOFOL;  Surgeon: Toledo, Boykin Nearing, MD;  Location: [facility] ENDOS[facility];  Service: Gastroenterology;   Laterality: N/A;  ? HERNIA REPAIR    ? PTOSIS REPAIR Bilateral 01/04/2015  ? Procedure: PTOSIS REPAIR;  Surgeon: Imagene Riches, MD;  Location: [facility];  Service: Ophthalmology;  Laterality: Bilateral;  ? TOE SURGERY    ? ? ?Prior to Admission medications   ?Medication Sig Start Date End Date Taking? Authorizing Provider  ?amiodarone (PACERONE) 200 MG tablet Take 1 tablet by mouth daily. 10/24/20  Yes [provider]  ?gabapentin (NEURONTIN) 300 MG capsule TAKE 1 CAPSULE BY MOUTH THREE TIMES A DAY 06/28/20  Yes [provider]  ?glimepiride (AMARYL) 2 MG tablet Take 4 mg by mouth daily with breakfast.   Yes [provider]  ?LANTUS 100 UNIT/ML injection Inject 15 Units into the skin at bedtime. 10/05/14  Yes [provider]  ?loratadine (CLARITIN) 10 MG tablet Take by mouth.   Yes [provider]  ?lovastatin (MEVACOR) 40 MG tablet Take 40 mg by mouth daily. AM 08/09/14  Yes [provider]  ?magnesium oxide (MAG-OX) 400 MG tablet Take 400 mg by mouth daily. AM   Yes [provider]  ?meloxicam (MOBIC) 15 MG tablet Take 1 tablet by mouth daily. 10/23/20  Yes [provider]  ?metFORMIN (GLUCOPHAGE) 1000 MG tablet Take 1,000 mg by mouth 2 (two) times daily. AM 09/27/14  Yes [provider]  ?metoprolol succinate (TOPROL-XL) 50 MG 24 hr tablet Take 100 mg by mouth daily. 11/26/09  Yes [provider]  ?omeprazole (PRILOSEC) 20  MG capsule Take 20 mg by mouth daily. HS 10/24/14  Yes [provider]  ?PRADAXA 150 MG CAPS capsule Take 150 mg by mouth 2 (two) times daily. AM AND PM 10/28/14  Yes [provider]  ?PROAIR HFA 108 (90 BASE) MCG/ACT inhaler Inhale 2 puffs into the lungs every 6 (six) hours as needed. For shortness of breath and/or wheezing./ AM AND PM 08/30/14  Yes [provider]  ?QUEtiapine (SEROQUEL) 25 MG tablet Take by mouth. 03/02/21  Yes [provider]  ?Semaglutide-Weight  Management 0.5 MG/0.5ML SOAJ Inject into the skin. 03/08/21  Yes [provider]  ?Dwyane Luo 100-62.5-25 MCG/ACT AEPB Inhale into the lungs. 02/02/21  Yes [provider]  ? ? ?Allergies as of 03/14/2021 - Review Complete 03/30/2020  ?Allergen Reaction Noted  ? Prednisone Other (See Comments) 12/29/2014  ? Neomycin-polymyxin-gramicidin Itching and Rash 10/31/2014  ? ? ?History reviewed. No pertinent family history. ? ?Social History  ? ?Socioeconomic History  ? Marital status: Married  ?  Spouse name: Not on file  ? Number of children: Not on file  ? Years of education: Not on file  ? Highest education level: Not on file  ?Occupational History  ? Not on file  ?Tobacco Use  ? Smoking status: Former  ?  Packs/day: 2.00  ?  Years: 20.00  ?  Pack years: 40.00  ?  Types: Cigarettes  ?  Quit date: 03/31/2010  ?  Years since quitting: 11.0  ? Smokeless tobacco: Former  ?Vaping Use  ? Vaping Use: Never used  ?Substance and Sexual Activity  ? Alcohol use: Not Currently  ?  Alcohol/week: 10.0 standard drinks  ?  Types: 10 Glasses of wine per week  ?  Comment: quit 10 years ago  ? Drug use: No  ? Sexual activity: Not on file  ?Other Topics Concern  ? Not on file  ?Social History Narrative  ? Not on file  ? ?Social Determinants of Health  ? ?Financial Resource Strain: Not on file  ?Food Insecurity: Not on file  ?Transportation Needs: Not on file  ?Physical Activity: Not on file  ?Stress: Not on file  ?Social Connections: Not on file  ?Intimate Partner Violence: Not on file  ? ? ?Review of Systems: ?See HPI, otherwise negative ROS ? ?Physical Exam: ?BP (!) 151/78   Pulse 60   Temp (!) 97.5 ?F (36.4 ?C) (Temporal)   Resp 20   Ht 5\' 11"  (1.803 m)   Wt 121.6 kg   SpO2 96%   BMI 37.38 kg/m?  ?General:   Alert, cooperative in NAD ?Head:  Normocephalic and atraumatic. ?Respiratory:  Normal work of breathing. ?Cardiovascular:  RRR ? ?Impression/Plan: ?Zachary Guerrero is here for cataract surgery. ? ?Risks,  benefits, limitations, and alternatives regarding cataract surgery have been reviewed with the patient.  Questions have been answered.  All parties agreeable. ? ? ?Alexandria Lodge, MD  04/18/2021, 7:16 AM ? ? ?

## 2021-04-18 NOTE — Transfer of Care (Signed)
Immediate Anesthesia Transfer of Care Note ? ?Patient: Zachary Guerrero ? ?Procedure(s) Performed: CATARACT EXTRACTION PHACO AND INTRAOCULAR LENS PLACEMENT (IOC) LEFT DIABETIC 16.56 01:30.8 (Left: Eye) ? ?Patient Location: PACU ? ?Anesthesia Type: MAC ? ?Level of Consciousness: awake, alert  and patient cooperative ? ?Airway and Oxygen Therapy: Patient Spontanous Breathing and Patient connected to supplemental oxygen ? ?Post-op Assessment: Post-op Vital signs reviewed, Patient's Cardiovascular Status Stable, Respiratory Function Stable, Patent Airway and No signs of Nausea or vomiting ? ?Post-op Vital Signs: Reviewed and stable ? ?Complications: No notable events documented. ? ?

## 2021-04-18 NOTE — Anesthesia Postprocedure Evaluation (Signed)
Anesthesia Post Note ? ?Patient: Zachary Guerrero ? ?Procedure(s) Performed: CATARACT EXTRACTION PHACO AND INTRAOCULAR LENS PLACEMENT (IOC) LEFT DIABETIC 16.56 01:30.8 (Left: Eye) ? ? ?  ?Patient location during evaluation: PACU ?Anesthesia Type: MAC ?Level of consciousness: awake ?Pain management: pain level controlled ?Vital Signs Assessment: post-procedure vital signs reviewed and stable ?Respiratory status: respiratory function stable ?Cardiovascular status: stable ?Postop Assessment: no apparent nausea or vomiting ?Anesthetic complications: no ? ? ?No notable events documented. ? ?Veda Canning ? ? ? ? ? ?

## 2021-04-18 NOTE — Anesthesia Procedure Notes (Signed)
Procedure Name: [place] ?Date/Time: 04/18/2021 7:28 AM ?Performed by: Dionne Bucy, CRNA ?Pre-anesthesia Checklist: Patient identified, Emergency Drugs available, Suction available, Patient being monitored and Timeout performed ?Patient Re-evaluated:Patient Re-evaluated prior to induction ?Oxygen Delivery Method: Nasal cannula ?Placement Confirmation: positive ETCO2 ? ? ? ? ?

## 2021-04-18 NOTE — Op Note (Signed)
PREOPERATIVE DIAGNOSIS:  Nuclear sclerotic cataract of the left eye. ?  ?POSTOPERATIVE DIAGNOSIS:  Nuclear sclerotic cataract of the left eye. ?  ?OPERATIVE PROCEDURE:ORPROCALL@ ?  ?SURGEON:  Galen Manila, MD. ?  ?ANESTHESIA: ? ?Anesthesiologist: Jola Babinski, MD ?CRNA: Lily Kocher, CRNA ? ?1.      Managed anesthesia care. ?2.     0.14ml of Shugarcaine was instilled following the paracentesis ?  ?COMPLICATIONS:  None. ?  ?TECHNIQUE:   Stop and chop ?  ?DESCRIPTION OF PROCEDURE:  The patient was examined and consented in the preoperative holding area where the aforementioned topical anesthesia was applied to the left eye and then brought back to the Operating Room where the left eye was prepped and draped in the usual sterile ophthalmic fashion and a lid speculum was placed. A paracentesis was created with the side port blade and the anterior chamber was filled with viscoelastic. A near clear corneal incision was performed with the steel keratome. A continuous curvilinear capsulorrhexis was performed with a cystotome followed by the capsulorrhexis forceps. Hydrodissection and hydrodelineation were carried out with BSS on a blunt cannula. The lens was removed in a stop and chop  technique and the remaining cortical material was removed with the irrigation-aspiration handpiece. The capsular bag was inflated with viscoelastic and the Technis [id number] lens was placed in the capsular bag without complication. The remaining viscoelastic was removed from the eye with the irrigation-aspiration handpiece. The wounds were hydrated. The anterior chamber was flushed with BSS and the eye was inflated to physiologic pressure. 0.37ml Vigamox was placed in the anterior chamber. The wounds were found to be water tight. The eye was dressed with Combigan. The patient was given protective glasses to wear throughout the day and a shield with which to sleep tonight. The patient was also given drops with which to begin a drop regimen  today and will follow-up with me in one day. ?Implant Name Type Inv. Item Serial No. Manufacturer Lot No. LRB No. Used Action  ?LENS IOL TECNIS EYHANCE 22.0 - [id number] Intraocular Lens LENS IOL TECNIS EYHANCE 22.0 [id number] [employer]  Left 1 Implanted  ?  ?Procedure(s) with comments: ?CATARACT EXTRACTION PHACO AND INTRAOCULAR LENS PLACEMENT (IOC) LEFT DIABETIC 16.56 01:30.8 (Left) - Diabetic ? ?Electronically signed: Coulter Oldaker 04/18/2021 7:49 AM ? ?

## 2021-04-19 ENCOUNTER — Encounter: Payer: Self-pay | Admitting: *Deleted

## 2021-04-19 ENCOUNTER — Encounter: Payer: Self-pay | Admitting: Ophthalmology

## 2021-04-19 DIAGNOSIS — J449 Chronic obstructive pulmonary disease, unspecified: Secondary | ICD-10-CM

## 2021-04-19 NOTE — Progress Notes (Signed)
Pulmonary Individual Treatment Plan ? ?Patient Details  ?Name: Zachary Guerrero ?MRN: [id number] ?Date of Birth: 12/13/1945 ?Referring Provider:   ?Flowsheet Row Pulmonary Rehab from 04/03/2021 in [facility] Cardiac and Pulmonary Rehab  ?Referring Provider [place]  ? ?  ? ? ?Initial Encounter Date:  ?Flowsheet Row Pulmonary Rehab from 04/03/2021 in [facility] Cardiac and Pulmonary Rehab  ?Date 04/03/21  ? ?  ? ? ?Visit Diagnosis: Chronic obstructive pulmonary disease, unspecified COPD type (HCC) ? ?Patient's Home Medications on Admission: ? ?Current Outpatient Medications:  ?  amiodarone (PACERONE) 200 MG tablet, Take 1 tablet by mouth daily., Disp: , Rfl:  ?  gabapentin (NEURONTIN) 300 MG capsule, TAKE 1 CAPSULE BY MOUTH THREE TIMES A DAY, Disp: , Rfl:  ?  glimepiride (AMARYL) 2 MG tablet, Take 4 mg by mouth daily with breakfast., Disp: , Rfl:  ?  LANTUS 100 UNIT/ML injection, Inject 15 Units into the skin at bedtime., Disp: , Rfl: 0 ?  loratadine (CLARITIN) 10 MG tablet, Take by mouth., Disp: , Rfl:  ?  lovastatin (MEVACOR) 40 MG tablet, Take 40 mg by mouth daily. AM, Disp: , Rfl: 0 ?  magnesium oxide (MAG-OX) 400 MG tablet, Take 400 mg by mouth daily. AM, Disp: , Rfl:  ?  meloxicam (MOBIC) 15 MG tablet, Take 1 tablet by mouth daily., Disp: , Rfl:  ?  metFORMIN (GLUCOPHAGE) 1000 MG tablet, Take 1,000 mg by mouth 2 (two) times daily. AM, Disp: , Rfl: 1 ?  metoprolol succinate (TOPROL-XL) 50 MG 24 hr tablet, Take 100 mg by mouth daily., Disp: , Rfl:  ?  omeprazole (PRILOSEC) 20 MG capsule, Take 20 mg by mouth daily. HS, Disp: , Rfl: 4 ?  PRADAXA 150 MG CAPS capsule, Take 150 mg by mouth 2 (two) times daily. AM AND PM, Disp: , Rfl:  ?  PROAIR HFA 108 (90 BASE) MCG/ACT inhaler, Inhale 2 puffs into the lungs every 6 (six) hours as needed. For shortness of breath and/or wheezing./ AM AND PM, Disp: , Rfl:  ?  QUEtiapine (SEROQUEL) 25 MG tablet, Take by mouth., Disp: , Rfl:  ?  Semaglutide-Weight Management 0.5 MG/0.5ML SOAJ, Inject into  the skin., Disp: , Rfl:  ?  TRELEGY ELLIPTA 100-62.5-25 MCG/ACT AEPB, Inhale into the lungs., Disp: , Rfl:  ? ?Past Medical History: ?Past Medical History:  ?Diagnosis Date  ? 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 Chronic kidney disease   ? KIDNEY STONES  ? COPD (chronic obstructive pulmonary disease) (HCC)   ? Coronary artery disease   ? Diabetes mellitus without complication (HCC)   ? Dysrhythmia   ? A-FIB/DR [name]  ? Hypercholesteremia   ? Hypertension   ? CONTROLLED ON MEDS  ? Pneumonia   ? IN PAST  ? Reiter's syndrome   ? Shortness of breath dyspnea   ? WITH EXERTION  ? Sleep apnea   ? C-PAP  ? Wears dentures   ? UPPERS  ? ? ?Tobacco Use: ?Social History  ? ?Tobacco Use  ?Smoking Status Former  ? Packs/day: 2.00  ? Years: 20.00  ? Pack years: 40.00  ? Types: Cigarettes  ? Quit date: 03/31/2010  ? Years since quitting: 11.0  ?Smokeless Tobacco Former  ? ? ?Labs: ?Review Flowsheet   ? ?    ? View : No data to display.  ?  ?  ?  ?  ?  ? ? ? ?Pulmonary Assessment Scores: ? Pulmonary Assessment Scores   ? ? Row Name 04/03/21 1134  ?  ?  ?  ?  ADL UCSD  ? SOB Score total 58    ? Rest 3    ? Walk 3    ? Stairs 4    ? Bath 2    ? Dress 1    ? Shop 2    ?  ? CAT Score  ? CAT Score 25    ?  ? mMRC Score  ? mMRC Score 1    ? ?  ?  ? ?  ?  ?UCSD: ?Self-administered rating of dyspnea associated with activities of daily living (ADLs) ?6-point scale (0 = "not at all" to 5 = "maximal or unable to do because of breathlessness")  ?Scoring Scores range from 0 to 120.  Minimally important difference is 5 units ? ?CAT: ?CAT can identify the health impairment of COPD patients and is better correlated with disease progression.  ?CAT has a scoring range of zero to 40. The CAT score is classified into four groups of low (less than 10), medium (10 - 20), high (21-30) and very high (31-40) based on the impact level of disease on health status. A CAT score over 10 suggests significant symptoms.  A worsening CAT score could be explained by an  exacerbation, poor medication adherence, poor inhaler technique, or progression of COPD or comorbid conditions.  ?CAT MCID is 2 points ? ?mMRC: ?mMRC (Modified Medical Research Council) Dyspnea Scale is used to assess the degree of baseline functional disability in patients of respiratory disease due to dyspnea. ?No minimal important difference is established. A decrease in score of 1 point or greater is considered a positive change.  ? ?Pulmonary Function Assessment: ? Pulmonary Function Assessment - 04/03/21 1134   ? ?  ? Pulmonary Function Tests  ? FVC% 75 %   ? FEV1% 59 %   ? FEV1/FVC Ratio 61.41   ? ?  ?  ? ?  ? ? ?Exercise Target Goals: ?Exercise Program Goal: ?Individual exercise prescription set using results from initial 6 min walk test and THRR while considering  patient?s activity barriers and safety.  ? ?Exercise Prescription Goal: ?Initial exercise prescription builds to 30-45 minutes a day of aerobic activity, 2-3 days per week.  Home exercise guidelines will be given to patient during program as part of exercise prescription that the participant will acknowledge. ? ?Education: Aerobic Exercise: ?- Group verbal and visual presentation on the components of exercise prescription. Introduces F.I.T.T principle from ACSM for exercise prescriptions.  Reviews F.I.T.T. principles of aerobic exercise including progression. Written material given at graduation. ? ? ?Education: Resistance Exercise: ?- Group verbal and visual presentation on the components of exercise prescription. Introduces F.I.T.T principle from ACSM for exercise prescriptions  Reviews F.I.T.T. principles of resistance exercise including progression. Written material given at graduation. ?Flowsheet Row Pulmonary Rehab from 04/13/2021 in [facility] Cardiac and Pulmonary Rehab  ?Date 04/06/21  ?Educator AS  ?Instruction Review Code 1- Verbalizes Understanding  ? ?  ? ?  ?Education: Exercise & Equipment Safety: ?- Individual verbal instruction and  demonstration of equipment use and safety with use of the equipment. ?Flowsheet Row Pulmonary Rehab from 04/13/2021 in [facility] Cardiac and Pulmonary Rehab  ?Date 04/03/21  ?Educator AS  ?Instruction Review Code 1- Verbalizes Understanding  ? ?  ? ? ?Education: Exercise Physiology & General Exercise Guidelines: ?- Group verbal and written instruction with models to review the exercise physiology of the cardiovascular system and associated critical values. Provides general exercise guidelines with specific guidelines to those with heart or lung disease.  ? ? ?  Education: Flexibility, Balance, Mind/Body Relaxation: ?- Group verbal and visual presentation with interactive activity on the components of exercise prescription. Introduces F.I.T.T principle from ACSM for exercise prescriptions. Reviews F.I.T.T. principles of flexibility and balance exercise training including progression. Also discusses the mind body connection.  Reviews various relaxation techniques to help reduce and manage stress (i.e. Deep breathing, progressive muscle relaxation, and visualization). Balance handout provided to take home. Written material given at graduation. ? ? ?Activity Barriers & Risk Stratification: ? Activity Barriers & Cardiac Risk Stratification - 03/20/21 1124   ? ?  ? Activity Barriers & Cardiac Risk Stratification  ? Activity Barriers Shortness of Breath;Deconditioning;Muscular Weakness   ? ?  ?  ? ?  ? ? ?6 Minute Walk: ? 6 Minute Walk   ? ? Row Name 04/03/21 1117  ?  ?  ?  ? 6 Minute Walk  ? Phase Initial    ? Distance 1450 feet    ? Walk Time 6 minutes    ? # of Rest Breaks 0    ? MPH 2.75    ? METS 2.65    ? RPE 13    ? Perceived Dyspnea  3    ? VO2 Peak 9.27    ? Symptoms Yes (comment)    ? Comments SOB    ? Resting HR 68 bpm    ? Resting BP 138/60    ? Resting Oxygen Saturation  94 %    ? Exercise Oxygen Saturation  during 6 min walk 88 %    ? Max Ex. HR 103 bpm    ? Max Ex. BP 154/68    ? 2 Minute Post BP 132/62    ?  ?  Interval HR  ? 1 Minute HR 84    ? 2 Minute HR 98    ? 3 Minute HR 100    ? 4 Minute HR 98    ? 5 Minute HR 103    ? 6 Minute HR 101    ? 2 Minute Post HR 82    ? Interval Heart Rate? Yes    ?  ? Interval Oxygen

## 2021-04-25 ENCOUNTER — Encounter: Payer: Self-pay | Admitting: Ophthalmology

## 2021-04-27 NOTE — Discharge Instructions (Signed)

## 2021-05-01 ENCOUNTER — Encounter: Payer: Self-pay | Admitting: *Deleted

## 2021-05-01 DIAGNOSIS — J449 Chronic obstructive pulmonary disease, unspecified: Secondary | ICD-10-CM

## 2021-05-02 ENCOUNTER — Ambulatory Visit
Admission: RE | Admit: 2021-05-02 | Discharge: 2021-05-02 | Disposition: A | Discharge status: Home / Self Care | Payer: Medicare PPO | Attending: Ophthalmology | Admitting: Ophthalmology

## 2021-05-02 ENCOUNTER — Encounter
Admission: RE | Disposition: A | Discharge status: Home / Self Care | Payer: Self-pay | Source: Home / Self Care | Attending: Ophthalmology

## 2021-05-02 ENCOUNTER — Ambulatory Visit: Payer: Medicare PPO | Admitting: Anesthesiology

## 2021-05-02 ENCOUNTER — Encounter: Payer: Self-pay | Admitting: Ophthalmology

## 2021-05-02 ENCOUNTER — Other Ambulatory Visit: Payer: Self-pay

## 2021-05-02 DIAGNOSIS — Z87891 Personal history of nicotine dependence: Secondary | ICD-10-CM | POA: Insufficient documentation

## 2021-05-02 DIAGNOSIS — E669 Obesity, unspecified: Secondary | ICD-10-CM | POA: Diagnosis not present

## 2021-05-02 DIAGNOSIS — Z6836 Body mass index (BMI) 36.0-36.9, adult: Secondary | ICD-10-CM | POA: Diagnosis not present

## 2021-05-02 DIAGNOSIS — I4891 Unspecified atrial fibrillation: Secondary | ICD-10-CM | POA: Diagnosis not present

## 2021-05-02 DIAGNOSIS — N189 Chronic kidney disease, unspecified: Secondary | ICD-10-CM | POA: Insufficient documentation

## 2021-05-02 DIAGNOSIS — E1122 Type 2 diabetes mellitus with diabetic chronic kidney disease: Secondary | ICD-10-CM | POA: Diagnosis not present

## 2021-05-02 DIAGNOSIS — H2511 Age-related nuclear cataract, right eye: Secondary | ICD-10-CM | POA: Insufficient documentation

## 2021-05-02 DIAGNOSIS — E1136 Type 2 diabetes mellitus with diabetic cataract: Secondary | ICD-10-CM | POA: Diagnosis present

## 2021-05-02 DIAGNOSIS — Z7901 Long term (current) use of anticoagulants: Secondary | ICD-10-CM | POA: Insufficient documentation

## 2021-05-02 DIAGNOSIS — I129 Hypertensive chronic kidney disease with stage 1 through stage 4 chronic kidney disease, or unspecified chronic kidney disease: Secondary | ICD-10-CM | POA: Diagnosis not present

## 2021-05-02 HISTORY — PX: CATARACT EXTRACTION W/PHACO: SHX586

## 2021-05-02 LAB — GLUCOSE, CAPILLARY
Glucose-Capillary: 150 mg/dL — ABNORMAL HIGH (ref 70–99)
Glucose-Capillary: 131 mg/dL — ABNORMAL HIGH (ref 70–99)

## 2021-05-02 SURGERY — PHACOEMULSIFICATION, CATARACT, WITH IOL INSERTION
Anesthesia: Monitor Anesthesia Care | Site: Eye | Laterality: Right

## 2021-05-02 MED ORDER — ONDANSETRON HCL 4 MG/2ML IJ SOLN: 4.0000 mg | Freq: Once | INTRAMUSCULAR | Status: DC | PRN

## 2021-05-02 MED ORDER — TETRACAINE HCL 0.5 % OP SOLN
1.0000 [drp] | OPHTHALMIC | Status: DC | PRN
  Administered 2021-05-02 (×3): 1 [drp] via OPHTHALMIC

## 2021-05-02 MED ORDER — BRIMONIDINE TARTRATE-TIMOLOL 0.2-0.5 % OP SOLN
OPHTHALMIC | Status: DC | PRN
  Administered 2021-05-02: 1 [drp] via OPHTHALMIC

## 2021-05-02 MED ORDER — FENTANYL CITRATE (PF) 100 MCG/2ML IJ SOLN
INTRAMUSCULAR | Status: DC | PRN
  Administered 2021-05-02: 50 ug via INTRAVENOUS

## 2021-05-02 MED ORDER — SIGHTPATH DOSE#1 BSS IO SOLN
INTRAOCULAR | Status: DC | PRN
Start: 2021-05-02 — End: 2021-05-02
  Administered 2021-05-02: 15 mL

## 2021-05-02 MED ORDER — SIGHTPATH DOSE#1 BSS IO SOLN
INTRAOCULAR | Status: DC | PRN
  Administered 2021-05-02: 1 mL

## 2021-05-02 MED ORDER — MOXIFLOXACIN HCL 0.5 % OP SOLN
OPHTHALMIC | Status: DC | PRN
  Administered 2021-05-02: 0.2 mL via OPHTHALMIC

## 2021-05-02 MED ORDER — ACETAMINOPHEN 160 MG/5ML PO SOLN: 325.0000 mg | ORAL | Status: DC | PRN

## 2021-05-02 MED ORDER — SIGHTPATH DOSE#1 NA CHONDROIT SULF-NA HYALURON 40-17 MG/ML IO SOLN
INTRAOCULAR | Status: DC | PRN
  Administered 2021-05-02: 1 mL via INTRAOCULAR

## 2021-05-02 MED ORDER — ACETAMINOPHEN 325 MG PO TABS: 325.0000 mg | ORAL_TABLET | ORAL | Status: DC | PRN

## 2021-05-02 MED ORDER — MIDAZOLAM HCL 2 MG/2ML IJ SOLN
INTRAMUSCULAR | Status: DC | PRN
  Administered 2021-05-02 (×2): 1 mg via INTRAVENOUS

## 2021-05-02 MED ORDER — ARMC OPHTHALMIC DILATING DROPS
1.0000 "application " | OPHTHALMIC | Status: DC | PRN
  Administered 2021-05-02 (×3): 1 via OPHTHALMIC

## 2021-05-02 MED ORDER — SIGHTPATH DOSE#1 BSS IO SOLN
INTRAOCULAR | Status: DC | PRN
  Administered 2021-05-02: 79 mL via OPHTHALMIC

## 2021-05-02 SURGICAL SUPPLY — 16 items
CANNULA ANT/CHMB 27G (MISCELLANEOUS) IMPLANT
CANNULA ANT/CHMB 27GA (MISCELLANEOUS) IMPLANT
CATARACT SUITE SIGHTPATH (MISCELLANEOUS) ×2 IMPLANT
FEE CATARACT SUITE SIGHTPATH (MISCELLANEOUS) ×2 IMPLANT
GLOVE SURG ENC TEXT LTX SZ8 (GLOVE) ×3 IMPLANT
GLOVE SURG TRIUMPH 8.0 PF LTX (GLOVE) ×3 IMPLANT
LENS IOL TECNIS EYHANCE 22.5 (Intraocular Lens) ×1 IMPLANT
NDL FILTER BLUNT 18X1 1/2 (NEEDLE) ×2 IMPLANT
NEEDLE FILTER BLUNT 18X 1/2SAF (NEEDLE) ×1
NEEDLE FILTER BLUNT 18X1 1/2 (NEEDLE) ×1 IMPLANT
PACK VIT ANT 23G (MISCELLANEOUS) IMPLANT
RING MALYGIN (MISCELLANEOUS) IMPLANT
SUT ETHILON 10-0 CS-B-6CS-B-6 (SUTURE)
SUTURE EHLN 10-0 CS-B-6CS-B-6 (SUTURE) IMPLANT
SYR 3ML LL SCALE MARK (SYRINGE) ×3 IMPLANT
WATER STERILE IRR 250ML POUR (IV SOLUTION) ×3 IMPLANT

## 2021-05-02 NOTE — Op Note (Signed)
PREOPERATIVE DIAGNOSIS:  Nuclear sclerotic cataract of the right eye. ?  ?POSTOPERATIVE DIAGNOSIS:   Cataract ?  ?OPERATIVE PROCEDURE:ORPROCALL@ ?  ?SURGEON:  Galen Manila, MD. ?  ?ANESTHESIA: ? ?Anesthesiologist: Jola Babinski, MD ?CRNA: Jinny Blossom, CRNA ? ?1.      Managed anesthesia care. ?2.      0.28ml of Shugarcaine was instilled in the eye following the paracentesis. ?  ?COMPLICATIONS:  None. ?  ?TECHNIQUE:   Stop and chop ?  ?DESCRIPTION OF PROCEDURE:  The patient was examined and consented in the preoperative holding area where the aforementioned topical anesthesia was applied to the right eye and then brought back to the Operating Room where the right eye was prepped and draped in the usual sterile ophthalmic fashion and a lid speculum was placed. A paracentesis was created with the side port blade and the anterior chamber was filled with viscoelastic. A near clear corneal incision was performed with the steel keratome. A continuous curvilinear capsulorrhexis was performed with a cystotome followed by the capsulorrhexis forceps. Hydrodissection and hydrodelineation were carried out with BSS on a blunt cannula. The lens was removed in a stop and chop  technique and the remaining cortical material was removed with the irrigation-aspiration handpiece. The capsular bag was inflated with viscoelastic and the Technis [id number]  lens was placed in the capsular bag without complication. The remaining viscoelastic was removed from the eye with the irrigation-aspiration handpiece. The wounds were hydrated. The anterior chamber was flushed with BSS and the eye was inflated to physiologic pressure. 0.32ml of Vigamox was placed in the anterior chamber. The wounds were found to be water tight. The eye was dressed with Combigan. The patient was given protective glasses to wear throughout the day and a shield with which to sleep tonight. The patient was also given drops with which to begin a drop regimen today and  will follow-up with me in one day. ?Implant Name Type Inv. Item Serial No. Manufacturer Lot No. LRB No. Used Action  ?LENS IOL TECNIS EYHANCE 22.5 - [id number] Intraocular Lens LENS IOL TECNIS EYHANCE 22.5 [id number] [employer]  Right 1 Implanted  ? ?Procedure(s) with comments: ?CATARACT EXTRACTION PHACO AND INTRAOCULAR LENS PLACEMENT (IOC) RIGHT DIABETIC (Right) - 6.98 ?0:55.9 ? ?Electronically signed: Anastacio Bua 05/02/2021 10:18 AM ? ?

## 2021-05-02 NOTE — H&P (Signed)
[facility]  ? ?Primary Care Physician:  Marguarite Arbour, MD ?Ophthalmologist: Dr. Druscilla Brownie ? ?Pre-Procedure History & Physical: ?HPI:  Zachary Guerrero is a 76 y.o. male here for cataract surgery. ?  ?Past Medical History:  ?Diagnosis Date  ? 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 Chronic kidney disease   ? KIDNEY STONES  ? COPD (chronic obstructive pulmonary disease) (HCC)   ? Coronary artery disease   ? Diabetes mellitus without complication (HCC)   ? Dysrhythmia   ? A-FIB/DR [name]  ? Hypercholesteremia   ? Hypertension   ? CONTROLLED ON MEDS  ? Pneumonia   ? IN PAST  ? Reiter's syndrome   ? Shortness of breath dyspnea   ? WITH EXERTION  ? Sleep apnea   ? C-PAP  ? Wears dentures   ? UPPERS  ? ? ?Past Surgical History:  ?Procedure Laterality Date  ? ARM SURGERY    ? FRACTURED  ? BROW LIFT Bilateral 01/04/2015  ? Procedure: BLEPHAROPLASTY;  Surgeon: Imagene Riches, MD;  Location: [facility];  Service: Ophthalmology;  Laterality: Bilateral;  DIABETIC-INSULIN AND ORAL MEDS/CPAP  ? CARDIAC CATHETERIZATION    ? CARDIAC SURGERY    ? CATARACT EXTRACTION W/PHACO Left 04/18/2021  ? Procedure: CATARACT EXTRACTION PHACO AND INTRAOCULAR LENS PLACEMENT (IOC) LEFT DIABETIC 16.56 01:30.8;  Surgeon: Galen Manila, MD;  Location: [facility];  Service: Ophthalmology;  Laterality: Left;  Diabetic  ? CHOLECYSTECTOMY    ? COLONOSCOPY WITH PROPOFOL N/A 12/26/2016  ? Procedure: COLONOSCOPY WITH PROPOFOL;  Surgeon: Toledo, Boykin Nearing, MD;  Location: [facility] ENDOSCOPY;  Service: Gastroenterology;  Laterality: N/A;  ? CORONARY ARTERY BYPASS GRAFT    ? X4  ? ECTROPION REPAIR Bilateral 01/04/2015  ? Procedure: REPAIR OF ECTROPION EXTENSIVE;  Surgeon: Imagene Riches, MD;  Location: [facility];  Service: Ophthalmology;  Laterality: Bilateral;  ? ESOPHAGOGASTRODUODENOSCOPY (EGD) WITH PROPOFOL N/A 12/26/2016  ? Procedure: ESOPHAGOGASTRODUODENOSCOPY (EGD) WITH PROPOFOL;  Surgeon: Toledo, Boykin Nearing, MD;  Location: [facility]  ENDOSCOPY;  Service: Gastroenterology;  Laterality: N/A;  ? ESOPHAGOGASTRODUODENOSCOPY (EGD) WITH PROPOFOL N/A 03/30/2020  ? Procedure: ESOPHAGOGASTRODUODENOSCOPY (EGD) WITH PROPOFOL;  Surgeon: Toledo, Boykin Nearing, MD;  Location: [facility] ENDOSCOPY;  Service: Gastroenterology;  Laterality: N/A;  ? HERNIA REPAIR    ? PTOSIS REPAIR Bilateral 01/04/2015  ? Procedure: PTOSIS REPAIR;  Surgeon: Imagene Riches, MD;  Location: [facility];  Service: Ophthalmology;  Laterality: Bilateral;  ? TOE SURGERY    ? ? ?Prior to Admission medications   ?Medication Sig Start Date End Date Taking? Authorizing Provider  ?amiodarone (PACERONE) 200 MG tablet Take 1 tablet by mouth daily. 10/24/20  Yes [provider]  ?gabapentin (NEURONTIN) 300 MG capsule TAKE 1 CAPSULE BY MOUTH THREE TIMES A DAY 06/28/20  Yes [provider]  ?glimepiride (AMARYL) 2 MG tablet Take 4 mg by mouth daily with breakfast.   Yes [provider]  ?LANTUS 100 UNIT/ML injection Inject 15 Units into the skin at bedtime. 10/05/14  Yes [provider]  ?loratadine (CLARITIN) 10 MG tablet Take by mouth.   Yes [provider]  ?lovastatin (MEVACOR) 40 MG tablet Take 40 mg by mouth daily. AM 08/09/14  Yes [provider]  ?magnesium oxide (MAG-OX) 400 MG tablet Take 400 mg by mouth daily. AM   Yes [provider]  ?meloxicam (MOBIC) 15 MG tablet Take 1 tablet by mouth daily. 10/23/20  Yes [provider]  ?metFORMIN (GLUCOPHAGE) 1000 MG tablet Take  1,000 mg by mouth 2 (two) times daily. AM 09/27/14  Yes [provider]  ?metoprolol succinate (TOPROL-XL) 50 MG 24 hr tablet Take 100 mg by mouth daily. 11/26/09  Yes [provider]  ?omeprazole (PRILOSEC) 20 MG capsule Take 20 mg by mouth daily. HS 10/24/14  Yes [provider]  ?PRADAXA 150 MG CAPS capsule Take 150 mg by mouth 2 (two) times daily. AM AND PM 10/28/14  Yes [provider]  ?PROAIR HFA 108 (90 BASE) MCG/ACT  inhaler Inhale 2 puffs into the lungs every 6 (six) hours as needed. For shortness of breath and/or wheezing./ AM AND PM 08/30/14  Yes [provider]  ?QUEtiapine (SEROQUEL) 25 MG tablet Take by mouth. 03/02/21  Yes [provider]  ?Semaglutide-Weight Management 0.5 MG/0.5ML SOAJ Inject into the skin. 03/08/21  Yes [provider]  ?Dwyane Luo 100-62.5-25 MCG/ACT AEPB Inhale into the lungs. 02/02/21  Yes [provider]  ? ? ?Allergies as of 03/14/2021 - Review Complete 03/30/2020  ?Allergen Reaction Noted  ? Prednisone Other (See Comments) 12/29/2014  ? Neomycin-polymyxin-gramicidin Itching and Rash 10/31/2014  ? ? ?History reviewed. No pertinent family history. ? ?Social History  ? ?Socioeconomic History  ? Marital status: Married  ?  Spouse name: Not on file  ? Number of children: Not on file  ? Years of education: Not on file  ? Highest education level: Not on file  ?Occupational History  ? Not on file  ?Tobacco Use  ? Smoking status: Former  ?  Packs/day: 2.00  ?  Years: 20.00  ?  Pack years: 40.00  ?  Types: Cigarettes  ?  Quit date: 03/31/2010  ?  Years since quitting: 11.0  ? Smokeless tobacco: Former  ?Vaping Use  ? Vaping Use: Never used  ?Substance and Sexual Activity  ? Alcohol use: Not Currently  ?  Alcohol/week: 10.0 standard drinks  ?  Types: 10 Glasses of wine per week  ?  Comment: quit 10 years ago  ? Drug use: No  ? Sexual activity: Not on file  ?Other Topics Concern  ? Not on file  ?Social History Narrative  ? Not on file  ? ?Social Determinants of Health  ? ?Financial Resource Strain: Not on file  ?Food Insecurity: Not on file  ?Transportation Needs: Not on file  ?Physical Activity: Not on file  ?Stress: Not on file  ?Social Connections: Not on file  ?Intimate Partner Violence: Not on file  ? ? ?Review of Systems: ?See HPI, otherwise negative ROS ? ?Physical Exam: ?BP 120/68   Pulse 68   Temp (!) 97.3 ?F (36.3 ?C) (Temporal)   Resp 15   Ht 5\' 11"  (1.803 m)    Wt 119.3 kg   SpO2 95%   BMI 36.68 kg/m?  ?General:   Alert, cooperative in NAD ?Head:  Normocephalic and atraumatic. ?Respiratory:  Normal work of breathing. ?Cardiovascular:  RRR ? ?Impression/Plan: ?Zachary Guerrero is here for cataract surgery. ? ?Risks, benefits, limitations, and alternatives regarding cataract surgery have been reviewed with the patient.  Questions have been answered.  All parties agreeable. ? ? ?Alexandria Lodge, MD  05/02/2021, 9:51 AM ? ? ?

## 2021-05-02 NOTE — Transfer of Care (Signed)
Immediate Anesthesia Transfer of Care Note ? ?Patient: Zachary Guerrero ? ?Procedure(s) Performed: CATARACT EXTRACTION PHACO AND INTRAOCULAR LENS PLACEMENT (IOC) RIGHT DIABETIC (Right: Eye) ? ?Patient Location: PACU ? ?Anesthesia Type: MAC ? ?Level of Consciousness: awake, alert  and patient cooperative ? ?Airway and Oxygen Therapy: Patient Spontanous Breathing and Patient connected to supplemental oxygen ? ?Post-op Assessment: Post-op Vital signs reviewed, Patient's Cardiovascular Status Stable, Respiratory Function Stable, Patent Airway and No signs of Nausea or vomiting ? ?Post-op Vital Signs: Reviewed and stable ? ?Complications: No notable events documented. ? ?

## 2021-05-02 NOTE — Anesthesia Postprocedure Evaluation (Signed)
Anesthesia Post Note ? ?Patient: Zachary Guerrero ? ?Procedure(s) Performed: CATARACT EXTRACTION PHACO AND INTRAOCULAR LENS PLACEMENT (IOC) RIGHT DIABETIC (Right: Eye) ? ? ?  ?Patient location during evaluation: PACU ?Anesthesia Type: MAC ?Level of consciousness: awake ?Pain management: pain level controlled ?Vital Signs Assessment: post-procedure vital signs reviewed and stable ?Respiratory status: respiratory function stable ?Cardiovascular status: stable ?Postop Assessment: no apparent nausea or vomiting ?Anesthetic complications: no ? ? ?No notable events documented. ? ?Veda Canning ? ? ? ? ? ?

## 2021-05-02 NOTE — Anesthesia Preprocedure Evaluation (Signed)
Anesthesia Evaluation  ?Patient identified by MRN, date of birth, ID band ?Patient awake ? ? ? ?Reviewed: ?Allergy & Precautions, NPO status  ? ?Airway ?Mallampati: II ? ?TM Distance: >3 FB ? ? ? ? Dental ?  ?Pulmonary ?sleep apnea , COPD, former smoker,  ?  ?Pulmonary exam normal ? ? ? ? ? ? ? Cardiovascular ?hypertension, + dysrhythmias Atrial Fibrillation  ?Rhythm:Regular Rate:Normal ? ?Last cardiology appointment 09/2020 - taking amiodarone and metoprolol for rate control, Pradaxa for anticoagulation.  ?  ?Neuro/Psych ?  ? GI/Hepatic ?  ?Endo/Other  ?diabetesObesity - BMI > 30 ? Renal/GU ?Renal InsufficiencyRenal disease  ? ?  ?Musculoskeletal ? ?(+) Arthritis ,  ? Abdominal ?  ?Peds ? Hematology ?  ?Anesthesia Other Findings ? ? Reproductive/Obstetrics ? ?  ? ? ? ? ? ? ? ? ? ? ? ? ? ?  ?  ? ? ? ? ? ? ? ? ?Anesthesia Physical ? ?Anesthesia Plan ? ?ASA: 3 ? ?Anesthesia Plan: MAC  ? ?Post-op Pain Management: Minimal or no pain anticipated  ? ?Induction: Intravenous ? ?PONV Risk Score and Plan: TIVA, Midazolam and Treatment may vary due to age or medical condition ? ?Airway Management Planned: Natural Airway and Nasal Cannula ? ?Additional Equipment:  ? ?Intra-op Plan:  ? ?Post-operative Plan:  ? ?Informed Consent: I have reviewed the patients History and Physical, chart, labs and discussed the procedure including the risks, benefits and alternatives for the proposed anesthesia with the patient or authorized representative who has indicated his/her understanding and acceptance.  ? ? ? ? ? ?Plan Discussed with: CRNA ? ?Anesthesia Plan Comments:   ? ? ? ? ? ? ?Anesthesia Quick Evaluation ? ?

## 2021-05-02 NOTE — Anesthesia Procedure Notes (Signed)
Procedure Name: Mora ?Date/Time: 05/02/2021 10:03 AM ?Performed by: Jeannene Patella, CRNA ?Pre-anesthesia Checklist: Patient identified, Emergency Drugs available, Suction available, Timeout performed and Patient being monitored ?Patient Re-evaluated:Patient Re-evaluated prior to induction ?Oxygen Delivery Method: Nasal cannula ?Placement Confirmation: positive ETCO2 ? ? ? ? ?

## 2021-05-03 ENCOUNTER — Encounter: Payer: Self-pay | Admitting: Ophthalmology

## 2021-05-11 ENCOUNTER — Telehealth: Payer: Self-pay

## 2021-05-11 DIAGNOSIS — J449 Chronic obstructive pulmonary disease, unspecified: Secondary | ICD-10-CM

## 2021-05-11 NOTE — Telephone Encounter (Signed)
Patient has been out for cataract surgery dated from 4/4. He will return to rehab next Tuesday, 4/18. Informed him to contact us if anything changes in the meantime. ?

## 2021-05-16 ENCOUNTER — Encounter: Payer: Medicare PPO | Attending: Internal Medicine

## 2021-05-16 DIAGNOSIS — J449 Chronic obstructive pulmonary disease, unspecified: Secondary | ICD-10-CM | POA: Insufficient documentation

## 2021-05-16 NOTE — Progress Notes (Signed)
Zachary Guerrero has not yet returned after cataract surgery. ?

## 2021-05-17 ENCOUNTER — Encounter: Payer: Self-pay | Admitting: *Deleted

## 2021-05-17 DIAGNOSIS — J449 Chronic obstructive pulmonary disease, unspecified: Secondary | ICD-10-CM

## 2021-05-17 NOTE — Progress Notes (Signed)
Pulmonary Individual Treatment Plan ? ?Patient Details  ?Name: Zachary Guerrero ?MRN: [id number] ?Date of Birth: [birth year] ?Referring Provider:   ?Flowsheet Row Pulmonary Rehab from 04/03/2021 in [facility] Cardiac and Pulmonary Rehab  ?Referring Provider Sparks  ? ?  ? ? ?Initial Encounter Date:  ?Flowsheet Row Pulmonary Rehab from 04/03/2021 in [facility] Cardiac and Pulmonary Rehab  ?Date 04/03/21  ? ?  ? ? ?Visit Diagnosis: Chronic obstructive pulmonary disease, unspecified COPD type (HCC) ? ?Patient's Home Medications on Admission: ? ?Current Outpatient Medications:  ?  amiodarone (PACERONE) 200 MG tablet, Take 1 tablet by mouth daily., Disp: , Rfl:  ?  gabapentin (NEURONTIN) 300 MG capsule, TAKE 1 CAPSULE BY MOUTH THREE TIMES A DAY, Disp: , Rfl:  ?  glimepiride (AMARYL) 2 MG tablet, Take 4 mg by mouth daily with breakfast., Disp: , Rfl:  ?  LANTUS 100 UNIT/ML injection, Inject 15 Units into the skin at bedtime., Disp: , Rfl: 0 ?  loratadine (CLARITIN) 10 MG tablet, Take by mouth., Disp: , Rfl:  ?  lovastatin (MEVACOR) 40 MG tablet, Take 40 mg by mouth daily. AM, Disp: , Rfl: 0 ?  magnesium oxide (MAG-OX) 400 MG tablet, Take 400 mg by mouth daily. AM, Disp: , Rfl:  ?  meloxicam (MOBIC) 15 MG tablet, Take 1 tablet by mouth daily., Disp: , Rfl:  ?  metFORMIN (GLUCOPHAGE) 1000 MG tablet, Take 1,000 mg by mouth 2 (two) times daily. AM, Disp: , Rfl: 1 ?  metoprolol succinate (TOPROL-XL) 50 MG 24 hr tablet, Take 100 mg by mouth daily., Disp: , Rfl:  ?  omeprazole (PRILOSEC) 20 MG capsule, Take 20 mg by mouth daily. HS, Disp: , Rfl: 4 ?  PRADAXA 150 MG CAPS capsule, Take 150 mg by mouth 2 (two) times daily. AM AND PM, Disp: , Rfl:  ?  PROAIR HFA 108 (90 BASE) MCG/ACT inhaler, Inhale 2 puffs into the lungs every 6 (six) hours as needed. For shortness of breath and/or wheezing./ AM AND PM, Disp: , Rfl:  ?  QUEtiapine (SEROQUEL) 25 MG tablet, Take by mouth., Disp: , Rfl:  ?  Semaglutide-Weight Management 0.5 MG/0.5ML SOAJ, Inject into  the skin., Disp: , Rfl:  ?  TRELEGY ELLIPTA 100-62.5-25 MCG/ACT AEPB, Inhale into the lungs., Disp: , Rfl:  ? ?Past Medical History: ?Past Medical History:  ?Diagnosis Date  ? 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 Chronic kidney disease   ? KIDNEY STONES  ? COPD (chronic obstructive pulmonary disease) (HCC)   ? Coronary artery disease   ? Diabetes mellitus without complication (HCC)   ? Dysrhythmia   ? A-FIB/DR [name]  ? Hypercholesteremia   ? Hypertension   ? CONTROLLED ON MEDS  ? Pneumonia   ? IN PAST  ? Reiter's syndrome   ? Shortness of breath dyspnea   ? WITH EXERTION  ? Sleep apnea   ? C-PAP  ? Wears dentures   ? UPPERS  ? ? ?Tobacco Use: ?Social History  ? ?Tobacco Use  ?Smoking Status Former  ? Packs/day: 2.00  ? Years: 20.00  ? Pack years: 40.00  ? Types: Cigarettes  ? Quit date: 03/31/2010  ? Years since quitting: 11.1  ?Smokeless Tobacco Former  ? ? ?Labs: ?Review Flowsheet   ? ?    ? View : No data to display.  ?  ?  ?  ?  ?  ? ? ? ?Pulmonary Assessment Scores: ? Pulmonary Assessment Scores   ? ? Row Name 04/03/21 1134  ?  ?  ?  ?  ADL UCSD  ? SOB Score total 58    ? Rest 3    ? Walk 3    ? Stairs 4    ? Bath 2    ? Dress 1    ? Shop 2    ?  ? CAT Score  ? CAT Score 25    ?  ? mMRC Score  ? mMRC Score 1    ? ?  ?  ? ?  ?  ?UCSD: ?Self-administered rating of dyspnea associated with activities of daily living (ADLs) ?6-point scale (0 = "not at all" to 5 = "maximal or unable to do because of breathlessness")  ?Scoring Scores range from 0 to 120.  Minimally important difference is 5 units ? ?CAT: ?CAT can identify the health impairment of COPD patients and is better correlated with disease progression.  ?CAT has a scoring range of zero to 40. The CAT score is classified into four groups of low (less than 10), medium (10 - 20), high (21-30) and very high (31-40) based on the impact level of disease on health status. A CAT score over 10 suggests significant symptoms.  A worsening CAT score could be explained by an  exacerbation, poor medication adherence, poor inhaler technique, or progression of COPD or comorbid conditions.  ?CAT MCID is 2 points ? ?mMRC: ?mMRC (Modified Medical Research Council) Dyspnea Scale is used to assess the degree of baseline functional disability in patients of respiratory disease due to dyspnea. ?No minimal important difference is established. A decrease in score of 1 point or greater is considered a positive change.  ? ?Pulmonary Function Assessment: ? Pulmonary Function Assessment - 04/03/21 1134   ? ?  ? Pulmonary Function Tests  ? FVC% 75 %   ? FEV1% 59 %   ? FEV1/FVC Ratio 61.41   ? ?  ?  ? ?  ? ? ?Exercise Target Goals: ?Exercise Program Goal: ?Individual exercise prescription set using results from initial 6 min walk test and THRR while considering  patient?s activity barriers and safety.  ? ?Exercise Prescription Goal: ?Initial exercise prescription builds to 30-45 minutes a day of aerobic activity, 2-3 days per week.  Home exercise guidelines will be given to patient during program as part of exercise prescription that the participant will acknowledge. ? ?Education: Aerobic Exercise: ?- Group verbal and visual presentation on the components of exercise prescription. Introduces F.I.T.T principle from ACSM for exercise prescriptions.  Reviews F.I.T.T. principles of aerobic exercise including progression. Written material given at graduation. ? ? ?Education: Resistance Exercise: ?- Group verbal and visual presentation on the components of exercise prescription. Introduces F.I.T.T principle from ACSM for exercise prescriptions  Reviews F.I.T.T. principles of resistance exercise including progression. Written material given at graduation. ?Flowsheet Row Pulmonary Rehab from 04/13/2021 in [facility] Cardiac and Pulmonary Rehab  ?Date 04/06/21  ?Educator AS  ?Instruction Review Code 1- Verbalizes Understanding  ? ?  ? ?  ?Education: Exercise & Equipment Safety: ?- Individual verbal instruction and  demonstration of equipment use and safety with use of the equipment. ?Flowsheet Row Pulmonary Rehab from 04/13/2021 in [facility] Cardiac and Pulmonary Rehab  ?Date 04/03/21  ?Educator AS  ?Instruction Review Code 1- Verbalizes Understanding  ? ?  ? ? ?Education: Exercise Physiology & General Exercise Guidelines: ?- Group verbal and written instruction with models to review the exercise physiology of the cardiovascular system and associated critical values. Provides general exercise guidelines with specific guidelines to those with heart or lung disease.  ? ? ?  Education: Flexibility, Balance, Mind/Body Relaxation: ?- Group verbal and visual presentation with interactive activity on the components of exercise prescription. Introduces F.I.T.T principle from ACSM for exercise prescriptions. Reviews F.I.T.T. principles of flexibility and balance exercise training including progression. Also discusses the mind body connection.  Reviews various relaxation techniques to help reduce and manage stress (i.e. Deep breathing, progressive muscle relaxation, and visualization). Balance handout provided to take home. Written material given at graduation. ? ? ?Activity Barriers & Risk Stratification: ? Activity Barriers & Cardiac Risk Stratification - 03/20/21 1124   ? ?  ? Activity Barriers & Cardiac Risk Stratification  ? Activity Barriers Shortness of Breath;Deconditioning;Muscular Weakness   ? ?  ?  ? ?  ? ? ?6 Minute Walk: ? 6 Minute Walk   ? ? Row Name 04/03/21 1117  ?  ?  ?  ? 6 Minute Walk  ? Phase Initial    ? Distance 1450 feet    ? Walk Time 6 minutes    ? # of Rest Breaks 0    ? MPH 2.75    ? METS 2.65    ? RPE 13    ? Perceived Dyspnea  3    ? VO2 Peak 9.27    ? Symptoms Yes (comment)    ? Comments SOB    ? Resting HR 68 bpm    ? Resting BP 138/60    ? Resting Oxygen Saturation  94 %    ? Exercise Oxygen Saturation  during 6 min walk 88 %    ? Max Ex. HR 103 bpm    ? Max Ex. BP 154/68    ? 2 Minute Post BP 132/62    ?  ?  Interval HR  ? 1 Minute HR 84    ? 2 Minute HR 98    ? 3 Minute HR 100    ? 4 Minute HR 98    ? 5 Minute HR 103    ? 6 Minute HR 101    ? 2 Minute Post HR 82    ? Interval Heart Rate? Yes    ?  ? Interval Oxygen

## 2021-05-18 ENCOUNTER — Telehealth: Payer: Self-pay

## 2021-05-18 NOTE — Telephone Encounter (Signed)
Follow up with Dr Monday 4/27 for clearance to return - out of town until May 1  ?

## 2021-05-30 ENCOUNTER — Encounter: Payer: Medicare PPO | Attending: Internal Medicine

## 2021-05-30 DIAGNOSIS — J449 Chronic obstructive pulmonary disease, unspecified: Secondary | ICD-10-CM | POA: Diagnosis present

## 2021-05-30 NOTE — Progress Notes (Signed)
Daily Session Note ? ?Patient Details  ?Name: Zachary Guerrero ?MRN: [id number] ?Date of Birth: 01/24/1946 ?Referring Provider:   ?Flowsheet Row Pulmonary Rehab from 04/03/2021 in [facility] Cardiac and Pulmonary Rehab  ?Referring Provider Sparks  ? ?  ? ? ?Encounter Date: 05/30/2021 ? ?Check In: ? Session Check In - 05/30/21 0918   ? ?  ? Check-In  ? Supervising physician immediately available to respond to emergencies See telemetry face sheet for immediately available ER MD   ? Location [facility]-Cardiac & Pulmonary Rehab   ? Staff Present Birdie Sons, MPA, RN;Jessica [place], MA, RCEP, CCRP, [place], BS, ACSM CEP, Exercise Physiologist   ? Virtual Visit No   ? Medication changes reported     No   ? Fall or balance concerns reported    No   ? Warm-up and Cool-down Performed on first and last piece of equipment   ? Resistance Training Performed Yes   ? VAD Patient? No   ? PAD/SET Patient? No   ?  ? Pain Assessment  ? Currently in Pain? No/denies   ? ?  ?  ? ?  ? ? ? ? ? ?Social History  ? ?Tobacco Use  ?Smoking Status Former  ? Packs/day: 2.00  ? Years: 20.00  ? Pack years: 40.00  ? Types: Cigarettes  ? Quit date: 03/31/2010  ? Years since quitting: 11.1  ?Smokeless Tobacco Former  ? ? ?Goals Met:  ?Independence with exercise equipment ?Exercise tolerated well ?No report of concerns or symptoms today ?Strength training completed today ? ?Goals Unmet:  ?Not Applicable ? ?Comments: Pt able to follow exercise prescription today without complaint.  Will continue to monitor for progression. ? ? ? ?Dr. Emily Filbert is Medical Director for [place].  ?Dr. Ottie Glazier is Medical Director for [facility] Pulmonary Rehabilitation. ?

## 2021-05-31 DIAGNOSIS — J449 Chronic obstructive pulmonary disease, unspecified: Secondary | ICD-10-CM

## 2021-05-31 NOTE — Progress Notes (Signed)
Daily Session Note ? ?Patient Details  ?Name: Zachary Guerrero ?MRN: [id number] ?Date of Birth: August 26, 1945 ?Referring Provider:   ?Flowsheet Row Pulmonary Rehab from 04/03/2021 in [facility] Cardiac and Pulmonary Rehab  ?Referring Provider Sparks  ? ?  ? ? ?Encounter Date: 05/31/2021 ? ?Check In: ? Session Check In - 05/31/21 0933   ? ?  ? Check-In  ? Supervising physician immediately available to respond to emergencies See telemetry face sheet for immediately available ER MD   ? Location [facility]-Cardiac & Pulmonary Rehab   ? Staff Present Birdie Sons, MPA, RN;Joseph Sylvester, RCP,RRT,BSRT;Melissa [place], RDN, LDN   ? Virtual Visit No   ? Medication changes reported     No   ? Fall or balance concerns reported    No   ? Warm-up and Cool-down Performed on first and last piece of equipment   ? Resistance Training Performed Yes   ? VAD Patient? No   ? PAD/SET Patient? No   ?  ? Pain Assessment  ? Currently in Pain? No/denies   ? ?  ?  ? ?  ? ? ? ? ? ?Social History  ? ?Tobacco Use  ?Smoking Status Former  ? Packs/day: 2.00  ? Years: 20.00  ? Pack years: 40.00  ? Types: Cigarettes  ? Quit date: 03/31/2010  ? Years since quitting: 11.1  ?Smokeless Tobacco Former  ? ? ?Goals Met:  ?Independence with exercise equipment ?Exercise tolerated well ?No report of concerns or symptoms today ?Strength training completed today ? ?Goals Unmet:  ?Not Applicable ? ?Comments: Pt able to follow exercise prescription today without complaint.  Will continue to monitor for progression. ? ? ? ?Dr. Emily Filbert is Medical Director for [place].  ?Dr. Ottie Glazier is Medical Director for [facility] Pulmonary Rehabilitation. ?

## 2021-06-01 DIAGNOSIS — J449 Chronic obstructive pulmonary disease, unspecified: Secondary | ICD-10-CM

## 2021-06-01 NOTE — Progress Notes (Signed)
Daily Session Note ? ?Patient Details  ?Name: Zachary Guerrero ?MRN: [id number] ?Date of Birth: Dec 28, 1945 ?Referring Provider:   ?Flowsheet Row Pulmonary Rehab from 04/03/2021 in [facility] Cardiac and Pulmonary Rehab  ?Referring Provider Sparks  ? ?  ? ? ?Encounter Date: 06/01/2021 ? ?Check In: ? Session Check In - 06/01/21 0919   ? ?  ? Check-In  ? Supervising physician immediately available to respond to emergencies See telemetry face sheet for immediately available ER MD   ? Location [facility]-Cardiac & Pulmonary Rehab   ? Staff Present Birdie Sons, MPA, RN;Laureen Owens [place]ark, BS, RRT, CPFT;Melissa [place], RDN, LDN   ? Virtual Visit No   ? Medication changes reported     No   ? Fall or balance concerns reported    No   ? Warm-up and Cool-down Performed on first and last piece of equipment   ? Resistance Training Performed Yes   ? VAD Patient? No   ? PAD/SET Patient? No   ?  ? Pain Assessment  ? Currently in Pain? No/denies   ? ?  ?  ? ?  ? ? ? ? ? ?Social History  ? ?Tobacco Use  ?Smoking Status Former  ? Packs/day: 2.00  ? Years: 20.00  ? Pack years: 40.00  ? Types: Cigarettes  ? Quit date: 03/31/2010  ? Years since quitting: 11.1  ?Smokeless Tobacco Former  ? ? ?Goals Met:  ?Independence with exercise equipment ?Exercise tolerated well ?No report of concerns or symptoms today ?Strength training completed today ? ?Goals Unmet:  ?Not Applicable ? ?Comments: Pt able to follow exercise prescription today without complaint.  Will continue to monitor for progression. ? ? ? ?Dr. Emily Filbert is Medical Director for [place].  ?Dr. Ottie Glazier is Medical Director for [facility] Pulmonary Rehabilitation. ?

## 2021-06-06 DIAGNOSIS — J449 Chronic obstructive pulmonary disease, unspecified: Secondary | ICD-10-CM

## 2021-06-06 NOTE — Progress Notes (Signed)
Daily Session Note ? ?Patient Details  ?Name: Zachary Guerrero ?MRN: [id number] ?Date of Birth: 1945/05/03 ?Referring Provider:   ?Flowsheet Row Pulmonary Rehab from 04/03/2021 in [facility] Cardiac and Pulmonary Rehab  ?Referring Provider Sparks  ? ?  ? ? ?Encounter Date: 06/06/2021 ? ?Check In: ? Session Check In - 06/06/21 0916   ? ?  ? Check-In  ? Supervising physician immediately available to respond to emergencies See telemetry face sheet for immediately available ER MD   ? Location [facility]-Cardiac & Pulmonary Rehab   ? Staff Present Birdie Sons, MPA, RN;Jessica [place], MA, RCEP, CCRP, [place], BS, ACSM CEP, Exercise Physiologist   ? Virtual Visit No   ? Medication changes reported     No   ? Fall or balance concerns reported    No   ? Warm-up and Cool-down Performed on first and last piece of equipment   ? Resistance Training Performed Yes   ? VAD Patient? No   ? PAD/SET Patient? No   ?  ? Pain Assessment  ? Currently in Pain? No/denies   ? ?  ?  ? ?  ? ? ? ? ? ?Social History  ? ?Tobacco Use  ?Smoking Status Former  ? Packs/day: 2.00  ? Years: 20.00  ? Pack years: 40.00  ? Types: Cigarettes  ? Quit date: 03/31/2010  ? Years since quitting: 11.1  ?Smokeless Tobacco Former  ? ? ?Goals Met:  ?Independence with exercise equipment ?Exercise tolerated well ?Personal goals reviewed ?No report of concerns or symptoms today ?Strength training completed today ? ?Goals Unmet:  ?Not Applicable ? ?Comments: Pt able to follow exercise prescription today without complaint.  Will continue to monitor for progression. ? ?Reviewed home exercise with pt today.  Pt plans to walk for exercise.  Reviewed THR, pulse, RPE, sign and symptoms, pulse oximetery and when to call 911 or MD.  Also discussed weather considerations and indoor options.  Pt voiced understanding.  ? ? ?Dr. Emily Filbert is Medical Director for [place].  ?Dr. Ottie Glazier is Medical Director for [facility] Pulmonary Rehabilitation. ?

## 2021-06-07 DIAGNOSIS — J449 Chronic obstructive pulmonary disease, unspecified: Secondary | ICD-10-CM | POA: Diagnosis not present

## 2021-06-07 NOTE — Progress Notes (Signed)
Daily Session Note ? ?Patient Details  ?Name: Zachary Guerrero ?MRN: [id number] ?Date of Birth: October 24, 1945 ?Referring Provider:   ?Flowsheet Row Pulmonary Rehab from 04/03/2021 in [facility] Cardiac and Pulmonary Rehab  ?Referring Provider Sparks  ? ?  ? ? ?Encounter Date: 06/07/2021 ? ?Check In: ? Session Check In - 06/07/21 0936   ? ?  ? Check-In  ? Supervising physician immediately available to respond to emergencies See telemetry face sheet for immediately available ER MD   ? Location [facility]-Cardiac & Pulmonary Rehab   ? Staff Present Birdie Sons, MPA, RN;Melissa [place], RDN, LDN;Joseph [place], RCP,RRT,BSRT   ? Virtual Visit No   ? Medication changes reported     No   ? Fall or balance concerns reported    No   ? Warm-up and Cool-down Performed on first and last piece of equipment   ? Resistance Training Performed Yes   ? VAD Patient? No   ? PAD/SET Patient? No   ?  ? Pain Assessment  ? Currently in Pain? No/denies   ? ?  ?  ? ?  ? ? ? ? ? ?Social History  ? ?Tobacco Use  ?Smoking Status Former  ? Packs/day: 2.00  ? Years: 20.00  ? Pack years: 40.00  ? Types: Cigarettes  ? Quit date: 03/31/2010  ? Years since quitting: 11.1  ?Smokeless Tobacco Former  ? ? ?Goals Met:  ?Independence with exercise equipment ?Exercise tolerated well ?No report of concerns or symptoms today ?Strength training completed today ? ?Goals Unmet:  ?Not Applicable ? ?Comments: Pt able to follow exercise prescription today without complaint.  Will continue to monitor for progression. ? ? ? ?Dr. Emily Filbert is Medical Director for [place].  ?Dr. Ottie Glazier is Medical Director for [facility] Pulmonary Rehabilitation. ?

## 2021-06-08 ENCOUNTER — Encounter: Payer: Medicare PPO | Admitting: *Deleted

## 2021-06-08 DIAGNOSIS — J449 Chronic obstructive pulmonary disease, unspecified: Secondary | ICD-10-CM

## 2021-06-08 NOTE — Progress Notes (Signed)
Daily Session Note ? ?Patient Details  ?Name: Zachary Guerrero ?MRN: [id number] ?Date of Birth: 1945-08-31 ?Referring Provider:   ?Flowsheet Row Pulmonary Rehab from 04/03/2021 in [facility] Cardiac and Pulmonary Rehab  ?Referring Provider Sparks  ? ?  ? ? ?Encounter Date: 06/08/2021 ? ?Check In: ? Session Check In - 06/08/21 0935   ? ?  ? Check-In  ? Supervising physician immediately available to respond to emergencies See telemetry face sheet for immediately available ER MD   ? Location [facility]-Cardiac & Pulmonary Rehab   ? Staff Present Heath Lark, RN, BSN, CCRP;Melissa [place], RDN, LDN;Joseph [place], [place]   ? Virtual Visit No   ? Medication changes reported     No   ? Fall or balance concerns reported    No   ? Warm-up and Cool-down Performed on first and last piece of equipment   ? Resistance Training Performed Yes   ? VAD Patient? No   ? PAD/SET Patient? No   ?  ? Pain Assessment  ? Currently in Pain? No/denies   ? ?  ?  ? ?  ? ? ? ? ? ?Social History  ? ?Tobacco Use  ?Smoking Status Former  ? Packs/day: 2.00  ? Years: 20.00  ? Pack years: 40.00  ? Types: Cigarettes  ? Quit date: 03/31/2010  ? Years since quitting: 11.1  ?Smokeless Tobacco Former  ? ? ?Goals Met:  ?Proper associated with RPD/PD & O2 Sat ?Independence with exercise equipment ?Exercise tolerated well ?No report of concerns or symptoms today ? ?Goals Unmet:  ?Not Applicable ? ?Comments: Pt able to follow exercise prescription today without complaint.  Will continue to monitor for progression. ? ? ? ?Dr. Emily Filbert is Medical Director for [place].  ?Dr. Ottie Glazier is Medical Director for [facility] Pulmonary Rehabilitation. ?

## 2021-06-13 DIAGNOSIS — J449 Chronic obstructive pulmonary disease, unspecified: Secondary | ICD-10-CM

## 2021-06-13 NOTE — Progress Notes (Signed)
Daily Session Note ? ?Patient Details  ?Name: Zachary Guerrero ?MRN: [id number] ?Date of Birth: 1945-10-09 ?Referring Provider:   ?Flowsheet Row Pulmonary Rehab from 04/03/2021 in [facility] Cardiac and Pulmonary Rehab  ?Referring Provider Sparks  ? ?  ? ? ?Encounter Date: 06/13/2021 ? ?Check In: ? Session Check In - 06/13/21 0919   ? ?  ? Check-In  ? Supervising physician immediately available to respond to emergencies See telemetry face sheet for immediately available ER MD   ? Location [facility]-Cardiac & Pulmonary Rehab   ? Staff Present Birdie Sons, MPA, RN;Jessica [place], MA, RCEP, CCRP, [place], BS, ACSM CEP, Exercise Physiologist   ? Virtual Visit No   ? Medication changes reported     No   ? Fall or balance concerns reported    No   ? Warm-up and Cool-down Performed on first and last piece of equipment   ? Resistance Training Performed Yes   ? VAD Patient? No   ? PAD/SET Patient? No   ?  ? Pain Assessment  ? Currently in Pain? No/denies   ? ?  ?  ? ?  ? ? ? ? ? ?Social History  ? ?Tobacco Use  ?Smoking Status Former  ? Packs/day: 2.00  ? Years: 20.00  ? Pack years: 40.00  ? Types: Cigarettes  ? Quit date: 03/31/2010  ? Years since quitting: 11.2  ?Smokeless Tobacco Former  ? ? ?Goals Met:  ?Independence with exercise equipment ?Exercise tolerated well ?No report of concerns or symptoms today ?Strength training completed today ? ?Goals Unmet:  ?Not Applicable ? ?Comments: Pt able to follow exercise prescription today without complaint.  Will continue to monitor for progression. ? ? ? ?Dr. Emily Filbert is Medical Director for [place].  ?Dr. Ottie Glazier is Medical Director for [facility] Pulmonary Rehabilitation. ?

## 2021-06-14 ENCOUNTER — Encounter: Payer: Self-pay | Admitting: *Deleted

## 2021-06-14 DIAGNOSIS — J449 Chronic obstructive pulmonary disease, unspecified: Secondary | ICD-10-CM | POA: Diagnosis not present

## 2021-06-14 NOTE — Progress Notes (Signed)
Pulmonary Individual Treatment Plan ? ?Patient Details  ?Name: Zachary Guerrero ?MRN: [id number] ?Date of Birth: [birth year] ?Referring Provider:   ?Flowsheet Row Pulmonary Rehab [facility]  ?Referring Provider Sparks  ? ?  ? ? ?Initial Encounter Date:  ?Flowsheet Row [facility]  ?Date 04/03/21  ? ?  ? ? ?Visit Diagnosis: Chronic obstructive pulmonary disease, unspecified COPD type (HCC) ? ?Patient's Home Medications on Admission: ? ?Current Outpatient Medications:  ?  amiodarone (PACERONE) 200 MG tablet, Take 1 tablet by mouth daily., Disp: , Rfl:  ?  gabapentin (NEURONTIN) 300 MG capsule, TAKE 1 CAPSULE BY MOUTH THREE TIMES A DAY, Disp: , Rfl:  ?  glimepiride (AMARYL) 2 MG tablet, Take 4 mg by mouth daily with breakfast., Disp: , Rfl:  ?  LANTUS 100 UNIT/ML injection, Inject 15 Units into the skin at bedtime., Disp: , Rfl: 0 ?  loratadine (CLARITIN) 10 MG tablet, Take by mouth., Disp: , Rfl:  ?  lovastatin (MEVACOR) 40 MG tablet, Take 40 mg by mouth daily. AM, Disp: , Rfl: 0 ?  magnesium oxide (MAG-OX) 400 MG tablet, Take 400 mg by mouth daily. AM, Disp: , Rfl:  ?  meloxicam (MOBIC) 15 MG tablet, Take 1 tablet by mouth daily., Disp: , Rfl:  ?  metFORMIN (GLUCOPHAGE) 1000 MG tablet, Take 1,000 mg by mouth 2 (two) times daily. AM, Disp: , Rfl: 1 ?  metoprolol succinate (TOPROL-XL) 50 MG 24 hr tablet, Take 100 mg by mouth daily., Disp: , Rfl:  ?  omeprazole (PRILOSEC) 20 MG capsule, Take 20 mg by mouth daily. HS, Disp: , Rfl: 4 ?  PRADAXA 150 MG CAPS capsule, Take 150 mg by mouth 2 (two) times daily. AM AND PM, Disp: , Rfl:  ?  PROAIR HFA 108 (90 BASE) MCG/ACT inhaler, Inhale 2 puffs into the lungs every 6 (six) hours as needed. For shortness of breath and/or wheezing./ AM AND PM, Disp: , Rfl:  ?  QUEtiapine (SEROQUEL) 25 MG tablet, Take by mouth., Disp: , Rfl:  ?  Semaglutide-Weight Management 0.5 MG/0.5ML SOAJ, Inject into  the skin., Disp: , Rfl:  ?  TRELEGY ELLIPTA 100-62.5-25 MCG/ACT AEPB, Inhale into the lungs., Disp: , Rfl:  ? ?Past Medical History: ?Past Medical History:  ?Diagnosis Date  ? 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 Chronic kidney disease   ? KIDNEY STONES  ? COPD (chronic obstructive pulmonary disease) (HCC)   ? Coronary artery disease   ? Diabetes mellitus without complication (HCC)   ? Dysrhythmia   ? A-FIB/DR Gwen PoundsKOWALSKI  ? Hypercholesteremia   ? Hypertension   ? CONTROLLED ON MEDS  ? Pneumonia   ? IN PAST  ? Reiter's syndrome   ? Shortness of breath dyspnea   ? WITH EXERTION  ? Sleep apnea   ? C-PAP  ? Wears dentures   ? UPPERS  ? ? ?Tobacco Use: ?Social History  ? ?Tobacco Use  ?Smoking Status Former  ? Packs/day: 2.00  ? Years: 20.00  ? Pack years: 40.00  ? Types: Cigarettes  ? Quit date: 03/31/2010  ? Years since quitting: 11.2  ?Smokeless Tobacco Former  ? ? ?Labs: ?Review Flowsheet   ? ?    ? View : No data to display.  ?  ?  ?  ?  ?  ? ? ? ?Pulmonary Assessment Scores: ? Pulmonary Assessment Scores   ? ? Row Name 04/03/21 1134  ?  ?  ?  ?  ADL UCSD  ? SOB Score total 58    ? Rest 3    ? Walk 3    ? Stairs 4    ? Bath 2    ? Dress 1    ? Shop 2    ?  ? CAT Score  ? CAT Score 25    ?  ? mMRC Score  ? mMRC Score 1    ? ?  ?  ? ?  ?  ?UCSD: ?Self-administered rating of dyspnea associated with activities of daily living (ADLs) ?6-point scale (0 = "not at all" to 5 = "maximal or unable to do because of breathlessness")  ?Scoring Scores range from 0 to 120.  Minimally important difference is 5 units ? ?CAT: ?CAT can identify the health impairment of COPD patients and is better correlated with disease progression.  ?CAT has a scoring range of zero to 40. The CAT score is classified into four groups of low (less than 10), medium (10 - 20), high (21-30) and very high (31-40) based on the impact level of disease on health status. A CAT score over 10 suggests significant symptoms.  A worsening CAT score could be explained by an  exacerbation, poor medication adherence, poor inhaler technique, or progression of COPD or comorbid conditions.  ?CAT MCID is 2 points ? ?mMRC: ?mMRC (Modified Medical Research Council) Dyspnea Scale is used to assess the degree of baseline functional disability in patients of respiratory disease due to dyspnea. ?No minimal important difference is established. A decrease in score of 1 point or greater is considered a positive change.  ? ?Pulmonary Function Assessment: ? Pulmonary Function Assessment - 04/03/21 1134   ? ?  ? Pulmonary Function Tests  ? FVC% 75 %   ? FEV1% 59 %   ? FEV1/FVC Ratio 61.41   ? ?  ?  ? ?  ? ? ?Exercise Target Goals: ?Exercise Program Goal: ?Individual exercise prescription set using results from initial 6 min walk test and THRR while considering  patient?s activity barriers and safety.  ? ?Exercise Prescription Goal: ?Initial exercise prescription builds to 30-45 minutes a day of aerobic activity, 2-3 days per week.  Home exercise guidelines will be given to patient during program as part of exercise prescription that the participant will acknowledge. ? ?Education: Aerobic Exercise: ?- Group verbal and visual presentation on the components of exercise prescription. Introduces F.I.T.T principle from ACSM for exercise prescriptions.  Reviews F.I.T.T. principles of aerobic exercise including progression. Written material given at graduation. ?Flowsheet Row Pulmonary Rehab from 06/07/2021 in [facility] Cardiac and Pulmonary Rehab  ?Date 05/31/21  ?Educator [facility]  ?Instruction Review Code 1- Verbalizes Understanding  ? ?  ? ? ?Education: Resistance Exercise: ?- Group verbal and visual presentation on the components of exercise prescription. Introduces F.I.T.T principle from ACSM for exercise prescriptions  Reviews F.I.T.T. principles of resistance exercise including progression. Written material given at graduation. ?Flowsheet Row Pulmonary Rehab from 06/07/2021 in [facility] Cardiac and Pulmonary Rehab  ?Date  04/06/21  ?Educator AS  ?Instruction Review Code 1- Verbalizes Understanding  ? ?  ? ?  ?Education: Exercise & Equipment Safety: ?- Individual verbal instruction and demonstration of equipment use and safety with use of the equipment. ?Flowsheet Row Pulmonary Rehab from 06/07/2021 in [facility] Cardiac and Pulmonary Rehab  ?Date 04/03/21  ?Educator AS  ?Instruction Review Code 1- Verbalizes Understanding  ? ?  ? ? ?Education: Exercise Physiology & General Exercise Guidelines: ?- Group verbal and written instruction with  models to review the exercise physiology of the cardiovascular system and associated critical values. Provides general exercise guidelines with specific guidelines to those with heart or lung disease.  ? ? ?Education: Flexibility, Balance, Mind/Body Relaxation: ?- Group verbal and visual presentation with interactive activity on the components of exercise prescription. Introduces F.I.T.T principle from ACSM for exercise prescriptions. Reviews F.I.T.T. principles of flexibility and balance exercise training including progression. Also discusses the mind body connection.  Reviews various relaxation techniques to help reduce and manage stress (i.e. Deep breathing, progressive muscle relaxation, and visualization). Balance handout provided to take home. Written material given at graduation. ? ? ?Activity Barriers & Risk Stratification: ? Activity Barriers & Cardiac Risk Stratification - 03/20/21 1124   ? ?  ? Activity Barriers & Cardiac Risk Stratification  ? Activity Barriers Shortness of Breath;Deconditioning;Muscular Weakness   ? ?  ?  ? ?  ? ? ?6 Minute Walk: ? 6 Minute Walk   ? ? Row Name 04/03/21 1117  ?  ?  ?  ? 6 Minute Walk  ? Phase Initial    ? Distance 1450 feet    ? Walk Time 6 minutes    ? # of Rest Breaks 0    ? MPH 2.75    ? METS 2.65    ? RPE 13    ? Perceived Dyspnea  3    ? VO2 Peak 9.27    ? Symptoms Yes (comment)    ? Comments SOB    ? Resting HR 68 bpm    ? Resting BP 138/60    ? Resting  Oxygen Saturation  94 %    ? Exercise Oxygen Saturation  during 6 min walk 88 %    ? Max Ex. HR 103 bpm    ? Max Ex. BP 154/68    ? 2 Minute Post BP 132/62    ?  ? Interval HR  ? 1 Minute HR 84    ? 2 Minu

## 2021-06-14 NOTE — Progress Notes (Signed)
Daily Session Note ? ?Patient Details  ?Name: Zachary Guerrero ?MRN: [id number] ?Date of Birth: [birth year] ?Referring Provider:   ?Flowsheet Row Pulmonary Rehab from 04/03/2021 in [facility] Cardiac and Pulmonary Rehab  ?Referring Provider Sparks  ? ?  ? ? ?Encounter Date: 06/14/2021 ? ?Check In: ? Session Check In - 06/14/21 0924   ? ?  ? Check-In  ? Supervising physician immediately available to respond to emergencies See telemetry face sheet for immediately available ER MD   ? Location [facility]-Cardiac & Pulmonary Rehab   ? Staff Present Birdie Sons, MPA, RN;Melissa Newcomb, RDN, LDN;Joseph Vinton, RCP,RRT,BSRT   ? Virtual Visit No   ? Medication changes reported     No   ? Fall or balance concerns reported    No   ? Warm-up and Cool-down Performed on first and last piece of equipment   ? Resistance Training Performed Yes   ? VAD Patient? No   ? PAD/SET Patient? No   ?  ? Pain Assessment  ? Currently in Pain? No/denies   ? ?  ?  ? ?  ? ? ? ? ? ?Social History  ? ?Tobacco Use  ?Smoking Status Former  ? Packs/day: 2.00  ? Years: 20.00  ? Pack years: 40.00  ? Types: Cigarettes  ? Quit date: 03/31/2010  ? Years since quitting: 11.2  ?Smokeless Tobacco Former  ? ? ?Goals Met:  ?Independence with exercise equipment ?Exercise tolerated well ?No report of concerns or symptoms today ?Strength training completed today ? ?Goals Unmet:  ?Not Applicable ? ?Comments: Pt able to follow exercise prescription today without complaint.  Will continue to monitor for progression. ? ? ? ?Dr. Emily Filbert is Medical Director for [place].  ?Dr. Ottie Glazier is Medical Director for [facility] Pulmonary Rehabilitation. ?

## 2021-06-15 ENCOUNTER — Encounter: Payer: Medicare PPO | Admitting: *Deleted

## 2021-06-15 DIAGNOSIS — J449 Chronic obstructive pulmonary disease, unspecified: Secondary | ICD-10-CM

## 2021-06-15 NOTE — Progress Notes (Signed)
Daily Session Note  Patient Details  Name: Zachary Guerrero MRN: [id number] Date of Birth: [birth year] Referring Provider:   Flowsheet Row Pulmonary Rehab from 04/03/2021 in [facility] Cardiac and Pulmonary Rehab  Referring Provider Sparks       Encounter Date: 06/15/2021  Check In:  Session Check In - 06/15/21 1002       Check-In   Supervising physician immediately available to respond to emergencies See telemetry face sheet for immediately available ER MD    Location [facility]-Cardiac & Pulmonary Rehab    Staff Present Heath Lark, RN, BSN, CCRP;Kelly Bollinger, MPA, RN;Melissa [place], RDN, LDN    Virtual Visit No    Medication changes reported     No    Fall or balance concerns reported    No    Warm-up and Cool-down Performed on first and last piece of equipment    VAD Patient? No    PAD/SET Patient? No      Pain Assessment   Currently in Pain? No/denies                Social History   Tobacco Use  Smoking Status Former   Packs/day: 2.00   Years: 20.00   Pack years: 40.00   Types: Cigarettes   Quit date: 03/31/2010   Years since quitting: 11.2  Smokeless Tobacco Former    Goals Met:  Proper associated with RPD/PD & O2 Sat Independence with exercise equipment Exercise tolerated well No report of concerns or symptoms today  Goals Unmet:  Not Applicable  Comments: Pt able to follow exercise prescription today without complaint.  Will continue to monitor for progression.    Dr. Emily Filbert is Medical Director for [place].  Dr. Ottie Glazier is Medical Director for [facility] Pulmonary Rehabilitation.

## 2021-06-20 DIAGNOSIS — J449 Chronic obstructive pulmonary disease, unspecified: Secondary | ICD-10-CM | POA: Diagnosis not present

## 2021-06-20 NOTE — Progress Notes (Signed)
Daily Session Note  Patient Details  Name: Zachary Guerrero MRN: [id number] Date of Birth: Jun 18, 1945 Referring Provider:   April Manson Pulmonary Rehab from 04/03/2021 in [facility] Cardiac and Pulmonary Rehab  Referring Provider Sparks       Encounter Date: 06/20/2021  Check In:  Session Check In - 06/20/21 0919       Check-In   Supervising physician immediately available to respond to emergencies See telemetry face sheet for immediately available ER MD    Location [facility]-Cardiac & Pulmonary Rehab    Staff Present Birdie Sons, MPA, Mauricia Area, BS, ACSM CEP, Exercise Physiologist;Jessica Luan Pulling, MA, RCEP, CCRP, CCET    Virtual Visit No    Medication changes reported     No    Fall or balance concerns reported    No    Warm-up and Cool-down Performed on first and last piece of equipment    Resistance Training Performed Yes    VAD Patient? No    PAD/SET Patient? No      Pain Assessment   Currently in Pain? No/denies                Social History   Tobacco Use  Smoking Status Former   Packs/day: 2.00   Years: 20.00   Pack years: 40.00   Types: Cigarettes   Quit date: 03/31/2010   Years since quitting: 11.2  Smokeless Tobacco Former    Goals Met:  Independence with exercise equipment Exercise tolerated well No report of concerns or symptoms today Strength training completed today  Goals Unmet:  Not Applicable  Comments: Pt able to follow exercise prescription today without complaint.  Will continue to monitor for progression.    Dr. Emily Filbert is Medical Director for [place].  Dr. Ottie Glazier is Medical Director for [facility] Pulmonary Rehabilitation.

## 2021-06-20 NOTE — Progress Notes (Signed)
Completed intial RD consultation 

## 2021-06-21 DIAGNOSIS — J449 Chronic obstructive pulmonary disease, unspecified: Secondary | ICD-10-CM

## 2021-06-21 NOTE — Progress Notes (Signed)
Daily Session Note  Patient Details  Name: Zachary Guerrero MRN: [id number] Date of Birth: 12/02/1945 Referring Provider:   Flowsheet Row Pulmonary Rehab from 04/03/2021 in [facility] Cardiac and Pulmonary Rehab  Referring Provider Sparks       Encounter Date: 06/21/2021  Check In:  Session Check In - 06/21/21 0917       Check-In   Supervising physician immediately available to respond to emergencies See telemetry face sheet for immediately available ER MD    Location [facility]-Cardiac & Pulmonary Rehab    Staff Present Kelly Bollinger, MPA, RN;Jessica Hawkins, MA, RCEP, CCRP, CCET;Joseph Hood, RCP,RRT,BSRT    Virtual Visit No    Medication changes reported     No    Fall or balance concerns reported    No    Warm-up and Cool-down Performed on first and last piece of equipment    Resistance Training Performed Yes    VAD Patient? No    PAD/SET Patient? No      Pain Assessment   Currently in Pain? No/denies                Social History   Tobacco Use  Smoking Status Former   Packs/day: 2.00   Years: 20.00   Pack years: 40.00   Types: Cigarettes   Quit date: 03/31/2010   Years since quitting: 11.2  Smokeless Tobacco Former    Goals Met:  Independence with exercise equipment Exercise tolerated well No report of concerns or symptoms today Strength training completed today  Goals Unmet:  Not Applicable  Comments: Pt able to follow exercise prescription today without complaint.  Will continue to monitor for progression.    Dr. Mark Miller is Medical Director for [facility].  Dr. Fuad Aleskerov is Medical Director for [facility] Pulmonary Rehabilitation. 

## 2021-06-22 DIAGNOSIS — J449 Chronic obstructive pulmonary disease, unspecified: Secondary | ICD-10-CM

## 2021-06-22 NOTE — Progress Notes (Signed)
Daily Session Note  Patient Details  Name: Zachary Guerrero MRN: [id number] Date of Birth: 1945-12-09 Referring Provider:   Flowsheet Row Pulmonary Rehab from 04/03/2021 in [facility] Cardiac and Pulmonary Rehab  Referring Provider Sparks       Encounter Date: 06/22/2021  Check In:  Session Check In - 06/22/21 0919       Check-In   Supervising physician immediately available to respond to emergencies See telemetry face sheet for immediately available ER MD    Location [facility]-Cardiac & Pulmonary Rehab    Staff Present Birdie Sons, MPA, Nino Glow, MS, ASCM CEP, Exercise Physiologist;Laureen Owens Shark, BS, RRT, CPFT    Virtual Visit No    Medication changes reported     No    Fall or balance concerns reported    No    Warm-up and Cool-down Performed on first and last piece of equipment    Resistance Training Performed Yes    VAD Patient? No    PAD/SET Patient? No      Pain Assessment   Currently in Pain? No/denies                Social History   Tobacco Use  Smoking Status Former   Packs/day: 2.00   Years: 20.00   Pack years: 40.00   Types: Cigarettes   Quit date: 03/31/2010   Years since quitting: 11.2  Smokeless Tobacco Former    Goals Met:  Independence with exercise equipment Exercise tolerated well No report of concerns or symptoms today Strength training completed today  Goals Unmet:  Not Applicable  Comments: Pt able to follow exercise prescription today without complaint.  Will continue to monitor for progression.    Dr. Emily Filbert is Medical Director for [place].  Dr. Ottie Glazier is Medical Director for [facility] Pulmonary Rehabilitation.

## 2021-06-27 DIAGNOSIS — J449 Chronic obstructive pulmonary disease, unspecified: Secondary | ICD-10-CM | POA: Diagnosis not present

## 2021-06-27 NOTE — Progress Notes (Signed)
Daily Session Note  Patient Details  Name: Zachary Guerrero MRN: [id number] Date of Birth: [birth year] Referring Provider:   April Manson Pulmonary Rehab from 04/03/2021 in [facility] Cardiac and Pulmonary Rehab  Referring Provider Sparks       Encounter Date: 06/27/2021  Check In:  Session Check In - 06/27/21 0912       Check-In   Supervising physician immediately available to respond to emergencies See telemetry face sheet for immediately available ER MD    Location [facility]-Cardiac & Pulmonary Rehab    Staff Present Birdie Sons, MPA, RN;Jessica Luan Pulling, MA, RCEP, CCRP, Rosalio Macadamia, BS, ACSM CEP, Exercise Physiologist    Virtual Visit No    Medication changes reported     No    Fall or balance concerns reported    No    Warm-up and Cool-down Performed on first and last piece of equipment    Resistance Training Performed Yes    VAD Patient? No    PAD/SET Patient? No      Pain Assessment   Currently in Pain? No/denies                Social History   Tobacco Use  Smoking Status Former   Packs/day: 2.00   Years: 20.00   Pack years: 40.00   Types: Cigarettes   Quit date: 03/31/2010   Years since quitting: 11.2  Smokeless Tobacco Former    Goals Met:  Independence with exercise equipment Exercise tolerated well No report of concerns or symptoms today Strength training completed today  Goals Unmet:  Not Applicable  Comments: Pt able to follow exercise prescription today without complaint.  Will continue to monitor for progression.    Dr. Emily Filbert is Medical Director for [place].  Dr. Ottie Glazier is Medical Director for [facility] Pulmonary Rehabilitation.

## 2021-06-28 DIAGNOSIS — J449 Chronic obstructive pulmonary disease, unspecified: Secondary | ICD-10-CM | POA: Diagnosis not present

## 2021-06-28 NOTE — Progress Notes (Signed)
Daily Session Note  Patient Details  Name: Zachary Guerrero MRN: [id number] Date of Birth: 1946-01-22 Referring Provider:   April Manson Pulmonary Rehab from 04/03/2021 in [facility] Cardiac and Pulmonary Rehab  Referring Provider Sparks       Encounter Date: 06/28/2021  Check In:  Session Check In - 06/28/21 0917       Check-In   Supervising physician immediately available to respond to emergencies See telemetry face sheet for immediately available ER MD    Location [facility]-Cardiac & Pulmonary Rehab    Staff Present Birdie Sons, MPA, RN;Joseph Lou Miner, MS, ASCM CEP, Exercise Physiologist    Virtual Visit No    Medication changes reported     No    Fall or balance concerns reported    No    Warm-up and Cool-down Performed on first and last piece of equipment    Resistance Training Performed Yes    VAD Patient? No    PAD/SET Patient? No      Pain Assessment   Currently in Pain? No/denies                Social History   Tobacco Use  Smoking Status Former   Packs/day: 2.00   Years: 20.00   Pack years: 40.00   Types: Cigarettes   Quit date: 03/31/2010   Years since quitting: 11.2  Smokeless Tobacco Former    Goals Met:  Independence with exercise equipment Exercise tolerated well No report of concerns or symptoms today Strength training completed today  Goals Unmet:  Not Applicable  Comments: Pt able to follow exercise prescription today without complaint.  Will continue to monitor for progression.    Dr. Emily Filbert is Medical Director for [place].  Dr. Ottie Glazier is Medical Director for [facility] Pulmonary Rehabilitation.

## 2021-07-04 ENCOUNTER — Encounter: Payer: Medicare PPO | Attending: Internal Medicine | Admitting: *Deleted

## 2021-07-04 DIAGNOSIS — J449 Chronic obstructive pulmonary disease, unspecified: Secondary | ICD-10-CM | POA: Diagnosis present

## 2021-07-04 NOTE — Progress Notes (Signed)
Daily Session Note  Patient Details  Name: Zachary Guerrero MRN: [id number] Date of Birth: [birth year] Referring Provider:   Flowsheet Row Pulmonary Rehab from 04/03/2021 in [facility] Cardiac and Pulmonary Rehab  Referring Provider Sparks       Encounter Date: 07/04/2021  Check In:  Session Check In - 07/04/21 0925       Check-In   Supervising physician immediately available to respond to emergencies See telemetry face sheet for immediately available ER MD    Location [facility]-Cardiac & Pulmonary Rehab    Staff Present Nyoka Cowden, RN, BSN, Jennye Moccasin, MPA, Mauricia Area, BS, ACSM CEP, Exercise Physiologist    Virtual Visit No    Medication changes reported     No    Fall or balance concerns reported    No    Tobacco Cessation No Change    Warm-up and Cool-down Performed on first and last piece of equipment    Resistance Training Performed Yes    VAD Patient? No    PAD/SET Patient? No      Pain Assessment   Currently in Pain? No/denies                Social History   Tobacco Use  Smoking Status Former   Packs/day: 2.00   Years: 20.00   Pack years: 40.00   Types: Cigarettes   Quit date: 03/31/2010   Years since quitting: 11.2  Smokeless Tobacco Former    Goals Met:  Independence with exercise equipment Exercise tolerated well No report of concerns or symptoms today  Goals Unmet:  Not Applicable  Comments: Pt able to follow exercise prescription today without complaint.  Will continue to monitor for progression.    Dr. Emily Filbert is Medical Director for [place].  Dr. Ottie Glazier is Medical Director for [facility] Pulmonary Rehabilitation.

## 2021-07-05 DIAGNOSIS — J449 Chronic obstructive pulmonary disease, unspecified: Secondary | ICD-10-CM

## 2021-07-05 NOTE — Progress Notes (Signed)
Daily Session Note  Patient Details  Name: Zachary Guerrero MRN: [id number] Date of Birth: February 02, 1945 Referring Provider:   April Manson Pulmonary Rehab from 04/03/2021 in [facility] Cardiac and Pulmonary Rehab  Referring Provider Sparks       Encounter Date: 07/05/2021  Check In:  Session Check In - 07/05/21 0915       Check-In   Supervising physician immediately available to respond to emergencies See telemetry face sheet for immediately available ER MD    Location [facility]-Cardiac & Pulmonary Rehab    Staff Present Carson Myrtle, BS, RRT, CPFT;Joseph Karie Fetch, MPA, RN    Virtual Visit No    Medication changes reported     No    Fall or balance concerns reported    No    Tobacco Cessation No Change    Warm-up and Cool-down Performed on first and last piece of equipment    Resistance Training Performed Yes    VAD Patient? No    PAD/SET Patient? No      Pain Assessment   Currently in Pain? No/denies                Social History   Tobacco Use  Smoking Status Former   Packs/day: 2.00   Years: 20.00   Pack years: 40.00   Types: Cigarettes   Quit date: 03/31/2010   Years since quitting: 11.2  Smokeless Tobacco Former    Goals Met:  Independence with exercise equipment Exercise tolerated well No report of concerns or symptoms today Strength training completed today  Goals Unmet:  Not Applicable  Comments: Pt able to follow exercise prescription today without complaint.  Will continue to monitor for progression.    Dr. Emily Filbert is Medical Director for [place].  Dr. Ottie Glazier is Medical Director for [facility] Pulmonary Rehabilitation.

## 2021-07-06 DIAGNOSIS — J449 Chronic obstructive pulmonary disease, unspecified: Secondary | ICD-10-CM

## 2021-07-06 NOTE — Progress Notes (Signed)
Daily Session Note  Patient Details  Name: Zachary Guerrero MRN: [id number] Date of Birth: September 20, 1945 Referring Provider:   Flowsheet Row Pulmonary Rehab from 04/03/2021 in [facility] Cardiac and Pulmonary Rehab  Referring Provider Sparks       Encounter Date: 07/06/2021  Check In:  Session Check In - 07/06/21 0924       Check-In   Supervising physician immediately available to respond to emergencies See telemetry face sheet for immediately available ER MD    Location [facility]-Cardiac & Pulmonary Rehab    Staff Present Earlean Shawl, BS, ACSM CEP, Exercise Physiologist;Kara Eliezer Bottom, MS, ASCM CEP, Exercise Physiologist;Jenniger Figiel Rosalia Hammers, MPA, RN    Virtual Visit No    Medication changes reported     No    Fall or balance concerns reported    No    Tobacco Cessation No Change    Warm-up and Cool-down Performed on first and last piece of equipment    Resistance Training Performed No   no resistance today   VAD Patient? No    PAD/SET Patient? No      Pain Assessment   Currently in Pain? No/denies                Social History   Tobacco Use  Smoking Status Former   Packs/day: 2.00   Years: 20.00   Total pack years: 40.00   Types: Cigarettes   Quit date: 03/31/2010   Years since quitting: 11.2  Smokeless Tobacco Former    Goals Met:  Independence with exercise equipment Exercise tolerated well No report of concerns or symptoms today  Goals Unmet:  Not Applicable  Comments: Pt able to follow exercise prescription today without complaint.  Will continue to monitor for progression.    Dr. Emily Filbert is Medical Director for [place].  Dr. Ottie Glazier is Medical Director for [facility] Pulmonary Rehabilitation.

## 2021-07-11 ENCOUNTER — Encounter: Payer: Medicare PPO | Admitting: *Deleted

## 2021-07-11 DIAGNOSIS — J449 Chronic obstructive pulmonary disease, unspecified: Secondary | ICD-10-CM

## 2021-07-11 NOTE — Progress Notes (Signed)
Daily Session Note  Patient Details  Name: TREVIS EDEN MRN: [id number] Date of Birth: 06/09/1945 Referring Provider:   Flowsheet Row Pulmonary Rehab from 04/03/2021 in [facility] Cardiac and Pulmonary Rehab  Referring Provider Sparks       Encounter Date: 07/11/2021  Check In:  Session Check In - 07/11/21 0955       Check-In   Supervising physician immediately available to respond to emergencies See telemetry face sheet for immediately available ER MD    Location [facility]-Cardiac & Pulmonary Rehab    Staff Present Heath Lark, RN, BSN, Laveda Norman, BS, ACSM CEP, Exercise Physiologist;Melissa [place], RDN, LDN    Virtual Visit No    Medication changes reported     No    Fall or balance concerns reported    No    Warm-up and Cool-down Performed on first and last piece of equipment    Resistance Training Performed Yes    VAD Patient? No    PAD/SET Patient? No      Pain Assessment   Currently in Pain? No/denies                Social History   Tobacco Use  Smoking Status Former   Packs/day: 2.00   Years: 20.00   Total pack years: 40.00   Types: Cigarettes   Quit date: 03/31/2010   Years since quitting: 11.2  Smokeless Tobacco Former    Goals Met:  Proper associated with RPD/PD & O2 Sat Independence with exercise equipment Exercise tolerated well No report of concerns or symptoms today  Goals Unmet:  Not Applicable  Comments: Pt able to follow exercise prescription today without complaint.  Will continue to monitor for progression.    Dr. Emily Filbert is Medical Director for [place].  Dr. Ottie Glazier is Medical Director for [facility] Pulmonary Rehabilitation.

## 2021-07-12 ENCOUNTER — Encounter: Payer: Self-pay | Admitting: *Deleted

## 2021-07-12 DIAGNOSIS — J449 Chronic obstructive pulmonary disease, unspecified: Secondary | ICD-10-CM

## 2021-07-12 NOTE — Progress Notes (Signed)
Pulmonary Individual Treatment Plan  Patient Details  Name: Zachary Guerrero MRN: [id number] Date of Birth: [birth year] Referring Provider:   Flowsheet Row Pulmonary Rehab from 04/03/2021 in [facility] Cardiac and Pulmonary Rehab  Referring Provider Sparks       Initial Encounter Date:  Flowsheet Row Pulmonary Rehab from 04/03/2021 in [facility] Cardiac and Pulmonary Rehab  Date 04/03/21       Visit Diagnosis: Chronic obstructive pulmonary disease, unspecified COPD type ([place])  Patient's Home Medications on Admission:  Current Outpatient Medications:    amiodarone (PACERONE) 200 MG tablet, Take 1 tablet by mouth daily., Disp: , Rfl:    gabapentin (NEURONTIN) 300 MG capsule, TAKE 1 CAPSULE BY MOUTH THREE TIMES A DAY, Disp: , Rfl:    glimepiride (AMARYL) 2 MG tablet, Take 4 mg by mouth daily with breakfast., Disp: , Rfl:    LANTUS 100 UNIT/ML injection, Inject 15 Units into the skin at bedtime., Disp: , Rfl: 0   loratadine (CLARITIN) 10 MG tablet, Take by mouth., Disp: , Rfl:    lovastatin (MEVACOR) 40 MG tablet, Take 40 mg by mouth daily. AM, Disp: , Rfl: 0   magnesium oxide (MAG-OX) 400 MG tablet, Take 400 mg by mouth daily. AM, Disp: , Rfl:    meloxicam (MOBIC) 15 MG tablet, Take 1 tablet by mouth daily., Disp: , Rfl:    metFORMIN (GLUCOPHAGE) 1000 MG tablet, Take 1,000 mg by mouth 2 (two) times daily. AM, Disp: , Rfl: 1   metoprolol succinate (TOPROL-XL) 50 MG 24 hr tablet, Take 100 mg by mouth daily., Disp: , Rfl:    omeprazole (PRILOSEC) 20 MG capsule, Take 20 mg by mouth daily. HS, Disp: , Rfl: 4   PRADAXA 150 MG CAPS capsule, Take 150 mg by mouth 2 (two) times daily. AM AND PM, Disp: , Rfl:    PROAIR HFA 108 (90 BASE) MCG/ACT inhaler, Inhale 2 puffs into the lungs every 6 (six) hours as needed. For shortness of breath and/or wheezing./ AM AND PM, Disp: , Rfl:    QUEtiapine (SEROQUEL) 25 MG tablet, Take by mouth., Disp: , Rfl:    Semaglutide-Weight Management 0.5 MG/0.5ML SOAJ, Inject into  the skin., Disp: , Rfl:    TRELEGY ELLIPTA 100-62.5-25 MCG/ACT AEPB, Inhale into the lungs., Disp: , Rfl:   Past Medical History: Past Medical History:  Diagnosis Date   Blepharoptosis    BILATERAL   Chronic kidney disease    KIDNEY STONES   COPD (chronic obstructive pulmonary disease) ([place])    Coronary artery disease    Diabetes mellitus without complication (HCC)    Dysrhythmia    A-FIB/DR KOWALSKI   Hypercholesteremia    Hypertension    CONTROLLED ON MEDS   Pneumonia    IN PAST   Reiter's syndrome    Shortness of breath dyspnea    WITH EXERTION   Sleep apnea    C-PAP   Wears dentures    UPPERS    Tobacco Use: Social History   Tobacco Use  Smoking Status Former   Packs/day: 2.00   Years: 20.00   Total pack years: 40.00   Types: Cigarettes   Quit date: 03/31/2010   Years since quitting: 11.2  Smokeless Tobacco Former    Labs: Review Flowsheet        No data to display           Pulmonary Assessment Scores:  Pulmonary Assessment Scores     Row Name 04/03/21 1134  ADL UCSD   SOB Score total 58     Rest 3     Walk 3     Stairs 4     Bath 2     Dress 1     Shop 2       CAT Score   CAT Score 25       mMRC Score   mMRC Score 1              UCSD: Self-administered rating of dyspnea associated with activities of daily living (ADLs) 6-point scale (0 = "not at all" to 5 = "maximal or unable to do because of breathlessness")  Scoring Scores range from 0 to 120.  Minimally important difference is 5 units  CAT: CAT can identify the health impairment of COPD patients and is better correlated with disease progression.  CAT has a scoring range of zero to 40. The CAT score is classified into four groups of low (less than 10), medium (10 - 20), high (21-30) and very high (31-40) based on the impact level of disease on health status. A CAT score over 10 suggests significant symptoms.  A worsening CAT score could be explained by an  exacerbation, poor medication adherence, poor inhaler technique, or progression of COPD or comorbid conditions.  CAT MCID is 2 points  mMRC: mMRC (Modified Medical Research Council) Dyspnea Scale is used to assess the degree of baseline functional disability in patients of respiratory disease due to dyspnea. No minimal important difference is established. A decrease in score of 1 point or greater is considered a positive change.   Pulmonary Function Assessment:  Pulmonary Function Assessment - 04/03/21 1134       Pulmonary Function Tests   FVC% 75 %    FEV1% 59 %    FEV1/FVC Ratio 61.41             Exercise Target Goals: Exercise Program Goal: Individual exercise prescription set using results from initial 6 min walk test and THRR while considering  patient's activity barriers and safety.   Exercise Prescription Goal: Initial exercise prescription builds to 30-45 minutes a day of aerobic activity, 2-3 days per week.  Home exercise guidelines will be given to patient during program as part of exercise prescription that the participant will acknowledge.  Education: Aerobic Exercise: - Group verbal and visual presentation on the components of exercise prescription. Introduces F.I.T.T principle from ACSM for exercise prescriptions.  Reviews F.I.T.T. principles of aerobic exercise including progression. Written material given at graduation. Flowsheet Row Pulmonary Rehab from 06/14/2021 in [facility] Cardiac and Pulmonary Rehab  Date 05/31/21  Educator [facility]  Instruction Review Code 1- Verbalizes Understanding       Education: Resistance Exercise: - Group verbal and visual presentation on the components of exercise prescription. Introduces F.I.T.T principle from ACSM for exercise prescriptions  Reviews F.I.T.T. principles of resistance exercise including progression. Written material given at graduation. Flowsheet Row Pulmonary Rehab from 06/14/2021 in [facility] Cardiac and Pulmonary Rehab  Date  04/06/21  Educator AS  Instruction Review Code 1- Verbalizes Understanding        Education: Exercise & Equipment Safety: - Individual verbal instruction and demonstration of equipment use and safety with use of the equipment. Flowsheet Row Pulmonary Rehab from 06/14/2021 in [facility] Cardiac and Pulmonary Rehab  Date 04/03/21  Educator AS  Instruction Review Code 1- Verbalizes Understanding       Education: Exercise Physiology & General Exercise Guidelines: - Group verbal and written instruction with  models to review the exercise physiology of the cardiovascular system and associated critical values. Provides general exercise guidelines with specific guidelines to those with heart or lung disease.    Education: Flexibility, Balance, Mind/Body Relaxation: - Group verbal and visual presentation with interactive activity on the components of exercise prescription. Introduces F.I.T.T principle from ACSM for exercise prescriptions. Reviews F.I.T.T. principles of flexibility and balance exercise training including progression. Also discusses the mind body connection.  Reviews various relaxation techniques to help reduce and manage stress (i.e. Deep breathing, progressive muscle relaxation, and visualization). Balance handout provided to take home. Written material given at graduation. Flowsheet Row Pulmonary Rehab from 06/14/2021 in [facility] Cardiac and Pulmonary Rehab  Date 06/14/21  Educator [facility]  Instruction Review Code 1- Verbalizes Understanding       Activity Barriers & Risk Stratification:  Activity Barriers & Cardiac Risk Stratification - 03/20/21 1124       Activity Barriers & Cardiac Risk Stratification   Activity Barriers Shortness of Breath;Deconditioning;Muscular Weakness             6 Minute Walk:  6 Minute Walk     Row Name 04/03/21 1117         6 Minute Walk   Phase Initial     Distance 1450 feet     Walk Time 6 minutes     # of Rest Breaks 0     MPH 2.75      METS 2.65     RPE 13     Perceived Dyspnea  3     VO2 Peak 9.27     Symptoms Yes (comment)     Comments SOB     Resting HR 68 bpm     Resting BP 138/60     Resting Oxygen Saturation  94 %     Exercise Oxygen Saturation  during 6 min walk 88 %     Max Ex. HR 103 bpm     Max Ex. BP 154/68     2 Minute Post BP 132/62       Interval HR   1 Minute HR 84     2 Minute HR 98     3 Minute HR 100     4 Minute HR 98     5 Minute HR 103     6 Minute HR 101     2 Minute Post HR 82     Interval Heart Rate? Yes       Interval Oxygen   Interval Oxygen? Yes     Baseline Oxygen Saturation % 94 %     1 Minute Oxygen Saturation % 91 %     1 Minute Liters of Oxygen 0 L     2 Minute Oxygen Saturation % 92 %     2 Minute Liters of Oxygen 0 L     3 Minute Oxygen Saturation % 89 %     3 Minute Liters of Oxygen 0 L     4 Minute Oxygen Saturation % 88 %     4 Minute Liters of Oxygen 0 L     5 Minute Oxygen Saturation % 88 %     5 Minute Liters of Oxygen 0 L     6 Minute Oxygen Saturation % 88 %     6 Minute Liters of Oxygen 0 L     2 Minute Post Oxygen Saturation % 92 %     2 Minute Post Liters of Oxygen 0 L  Oxygen Initial Assessment:  Oxygen Initial Assessment - 03/20/21 1133       Home Oxygen   Home Oxygen Device None    Sleep Oxygen Prescription None    Home Exercise Oxygen Prescription None    Home Resting Oxygen Prescription None      Initial 6 min Walk   Oxygen Used None      Program Oxygen Prescription   Program Oxygen Prescription None      Intervention   Short Term Goals To learn and understand importance of monitoring SPO2 with pulse oximeter and demonstrate accurate use of the pulse oximeter.;To learn and understand importance of maintaining oxygen saturations>88%;To learn and demonstrate proper pursed lip breathing techniques or other breathing techniques. ;To learn and demonstrate proper use of respiratory medications    Long  Term Goals Verbalizes  importance of monitoring SPO2 with pulse oximeter and return demonstration;Maintenance of O2 saturations>88%;Exhibits proper breathing techniques, such as pursed lip breathing or other method taught during program session;Compliance with respiratory medication;Demonstrates proper use of MDI's             Oxygen Re-Evaluation:  Oxygen Re-Evaluation     Row Name 04/04/21 0916 06/06/21 1008 06/20/21 0937         Program Oxygen Prescription   Program Oxygen Prescription None None None       Home Oxygen   Home Oxygen Device None None None     Sleep Oxygen Prescription None None None     Home Exercise Oxygen Prescription None None None     Home Resting Oxygen Prescription None None None       Goals/Expected Outcomes   Short Term Goals To learn and understand importance of monitoring SPO2 with pulse oximeter and demonstrate accurate use of the pulse oximeter.;To learn and understand importance of maintaining oxygen saturations>88%;To learn and demonstrate proper pursed lip breathing techniques or other breathing techniques.  To learn and understand importance of monitoring SPO2 with pulse oximeter and demonstrate accurate use of the pulse oximeter.;To learn and understand importance of maintaining oxygen saturations>88%;To learn and demonstrate proper pursed lip breathing techniques or other breathing techniques.  To learn and understand importance of monitoring SPO2 with pulse oximeter and demonstrate accurate use of the pulse oximeter.;To learn and understand importance of maintaining oxygen saturations>88%;To learn and demonstrate proper pursed lip breathing techniques or other breathing techniques.      Long  Term Goals Verbalizes importance of monitoring SPO2 with pulse oximeter and return demonstration;Maintenance of O2 saturations>88%;Exhibits proper breathing techniques, such as pursed lip breathing or other method taught during program session;Compliance with respiratory medication  Verbalizes importance of monitoring SPO2 with pulse oximeter and return demonstration;Maintenance of O2 saturations>88%;Exhibits proper breathing techniques, such as pursed lip breathing or other method taught during program session;Compliance with respiratory medication Verbalizes importance of monitoring SPO2 with pulse oximeter and return demonstration;Maintenance of O2 saturations>88%;Exhibits proper breathing techniques, such as pursed lip breathing or other method taught during program session;Compliance with respiratory medication     Comments Reviewed PLB technique with pt.  Talked about how it works and it's importance in maintaining their exercise saturations. Reviewed PLB technique with pt.  Talked about how it works and it's importance in maintaining their exercise saturations. Short; Continue to use PLB to help with breathing Long: Continue to improve breathing     Goals/Expected Outcomes Short: Become more profiecient at using PLB.   Long: Become independent at using PLB. Short: Become more profiecient at using PLB.   Long:  Become independent at using PLB. --              Oxygen Discharge (Final Oxygen Re-Evaluation):  Oxygen Re-Evaluation - 06/20/21 0937       Program Oxygen Prescription   Program Oxygen Prescription None      Home Oxygen   Home Oxygen Device None    Sleep Oxygen Prescription None    Home Exercise Oxygen Prescription None    Home Resting Oxygen Prescription None      Goals/Expected Outcomes   Short Term Goals To learn and understand importance of monitoring SPO2 with pulse oximeter and demonstrate accurate use of the pulse oximeter.;To learn and understand importance of maintaining oxygen saturations>88%;To learn and demonstrate proper pursed lip breathing techniques or other breathing techniques.     Long  Term Goals Verbalizes importance of monitoring SPO2 with pulse oximeter and return demonstration;Maintenance of O2 saturations>88%;Exhibits proper  breathing techniques, such as pursed lip breathing or other method taught during program session;Compliance with respiratory medication    Comments Short; Continue to use PLB to help with breathing Long: Continue to improve breathing             Initial Exercise Prescription:  Initial Exercise Prescription - 04/03/21 1100       Date of Initial Exercise RX and Referring Provider   Date 04/03/21    Referring Provider Sparks      Oxygen   Maintain Oxygen Saturation 88% or higher      Treadmill   MPH 2    Grade 0.5    Minutes 15    METs 2.65      Recumbant Bike   Level 1    RPM 60    Watts 25    Minutes 15    METs 2.65      NuStep   Level 2    SPM 80    Minutes 15    METs 2.65      Elliptical   Level 1    Speed 2.5    Minutes 15    METs 2.65      T5 Nustep   Level 2    SPM 80    Minutes 15    METs 2.65      Prescription Details   Frequency (times per week) 3    Duration Progress to 30 minutes of continuous aerobic without signs/symptoms of physical distress      Intensity   THRR 40-80% of Max Heartrate 99-131    Ratings of Perceived Exertion 11-15    Perceived Dyspnea 0-4      Resistance Training   Training Prescription Yes    Weight 3 lb    Reps 10-15             Perform Capillary Blood Glucose checks as needed.  Exercise Prescription Changes:   Exercise Prescription Changes     Row Name 04/03/21 1100 04/18/21 1200 05/31/21 1400 06/06/21 0900 06/06/21 1000     Response to Exercise   Blood Pressure (Admit) 138/60 132/80 102/62 -- --   Blood Pressure (Exercise) 154/68 138/82 140/64 -- --   Blood Pressure (Exit) 132/62 120/62 108/60 -- --   Heart Rate (Admit) 68 bpm 67 bpm 64 bpm -- --   Heart Rate (Exercise) 103 bpm 98 bpm 76 bpm -- --   Heart Rate (Exit) 82 bpm 76 bpm 65 bpm -- --   Oxygen Saturation (Admit) 94 % 97 % 93 % -- --   Oxygen  Saturation (Exercise) 88 % 90 % 90 % -- --   Oxygen Saturation (Exit) 92 % 93 % 92 % -- --    Rating of Perceived Exertion (Exercise) _0 -- --   Perceived Dyspnea (Exercise) _1 -- --   Symptoms _2    Comments -- -- return from eye surgery return from eye surgery return from eye surgery   Duration -- Progress to 30 minutes of  aerobic without signs/symptoms of physical distress Progress to 30 minutes of  aerobic without signs/symptoms of physical distress Progress to 30 minutes of  aerobic without signs/symptoms of physical distress Progress to 30 minutes of  aerobic without signs/symptoms of physical distress   Intensity -- -- THRR unchanged THRR unchanged THRR unchanged     Progression   Progression -- -- Continue to progress workloads to maintain intensity without signs/symptoms of physical distress. Continue to progress workloads to maintain intensity without signs/symptoms of physical distress. Continue to progress workloads to maintain intensity without signs/symptoms of physical distress.   Average METs -- -- 2.82 2.82 2.82     Resistance Training   Training Prescription -- Yes Yes Yes Yes   Weight -- 5 lb 5 lb 5 lb 5 lb   Reps -- 10-15 10-15 10-15 10-15     Interval Training   Interval Training -- -- No No No     Treadmill   MPH -- _3 Grade -- 0 0.5 0.5 0.5   Minutes -- _4 METs -- 2.53 2.67 2.67 2.67     NuStep   Level -- -- _5 Minutes -- -- _6 METs -- -- 3.2 3.2 3.2     T5 Nustep   Level -- _7 SPM -- 80 -- -- --   Minutes -- _8 METs -- 1.7 2.6 2.6 2.6     Home Exercise Plan   Plans to continue exercise at -- -- -- Home (comment)  walk Home (comment)  walk   Frequency -- -- -- Add 2 additional days to program exercise sessions. Add 2 additional days to program exercise sessions.   Initial Home Exercises Provided -- -- -- 06/06/21 06/06/21     Oxygen   Maintain Oxygen Saturation -- -- 88% or higher 88% or higher 88% or higher    Row Name 06/12/21 1200 06/28/21 0900 07/12/21 1100          Response to Exercise   Blood Pressure (Admit) 112/62 120/70 104/72     Blood Pressure (Exercise) 128/64 -- --     Blood Pressure (Exit) 102/60 124/64 126/68     Heart Rate (Admit) 75 bpm 70 bpm 63 bpm     Heart Rate (Exercise) 112 bpm 93 bpm 93 bpm     Heart Rate (Exit) 75 bpm 77 bpm 64 bpm     Oxygen Saturation (Admit) 93 % 92 % 93 %     Oxygen Saturation (Exercise) 91 % 89 % 89 %     Oxygen Saturation (Exit) 94 % 93 % 93 %     Rating of Perceived Exertion (Exercise) _9 Perceived Dyspnea (Exercise) _10 Symptoms SOB SOB SOB     Duration Continue with 30 min of aerobic exercise without signs/symptoms of physical distress. Continue with  30 min of aerobic exercise without signs/symptoms of physical distress. Continue with 30 min of aerobic exercise without signs/symptoms of physical distress.     Intensity -- THRR unchanged THRR unchanged       Progression   Progression Continue to progress workloads to maintain intensity without signs/symptoms of physical distress. Continue to progress workloads to maintain intensity without signs/symptoms of physical distress. Continue to progress workloads to maintain intensity without signs/symptoms of physical distress.     Average METs 2.56 2.7 2.68       Resistance Training   Training Prescription Yes Yes Yes     Weight 6 lb 6 lb 6 lb     Reps 10-15 10-15 10-15       Interval Training   Interval Training No No No       Treadmill   MPH _0 Grade 0.5 0.5 0     Minutes _1 METs 2.67 2.67 2.63       NuStep   Level _2 Minutes _3 METs 3.9 3.6 3.7       T5 Nustep   Level _4 Minutes _5 METs 2.3 2.3 2.1       Track   Laps -- 14 30     Minutes -- 15 15     METs -- 1.76 2.63       Home Exercise Plan   Plans to continue exercise at Home (comment)  walk Home (comment)  walk Home (comment)  walk     Frequency Add 2 additional days to program exercise sessions. Add 2  additional days to program exercise sessions. Add 2 additional days to program exercise sessions.     Initial Home Exercises Provided 06/06/21 06/06/21 06/06/21       Oxygen   Maintain Oxygen Saturation 88% or higher 88% or higher 88% or higher              Exercise Comments:   Exercise Comments     Row Name 04/04/21 0915           Exercise Comments First full day of exercise!  Patient was oriented to gym and equipment including functions, settings, policies, and procedures.  Patient's individual exercise prescription and treatment plan were reviewed.  All starting workloads were established based on the results of the 6 minute walk test done at initial orientation visit.  The plan for exercise progression was also introduced and progression will be customized based on patient's performance and goals.                Exercise Goals and Review:   Exercise Goals     Row Name 04/03/21 1130             Exercise Goals   Increase Physical Activity Yes       Intervention Provide advice, education, support and counseling about physical activity/exercise needs.;Develop an individualized exercise prescription for aerobic and resistive training based on initial evaluation findings, risk stratification, comorbidities and participant's personal goals.       Expected Outcomes Short Term: Attend rehab on a regular basis to increase amount of physical activity.;Long Term: Add in home exercise to make exercise part of routine and to increase amount of physical activity.;Long Term: Exercising regularly at least 3-5 days a week.  Increase Strength and Stamina Yes       Intervention Provide advice, education, support and counseling about physical activity/exercise needs.;Develop an individualized exercise prescription for aerobic and resistive training based on initial evaluation findings, risk stratification, comorbidities and participant's personal goals.       Expected Outcomes Short  Term: Increase workloads from initial exercise prescription for resistance, speed, and METs.;Short Term: Perform resistance training exercises routinely during rehab and add in resistance training at home;Long Term: Improve cardiorespiratory fitness, muscular endurance and strength as measured by increased METs and functional capacity (6MWT)       Able to understand and use rate of perceived exertion (RPE) scale Yes       Intervention Provide education and explanation on how to use RPE scale       Expected Outcomes Short Term: Able to use RPE daily in rehab to express subjective intensity level;Long Term:  Able to use RPE to guide intensity level when exercising independently       Able to understand and use Dyspnea scale Yes       Intervention Provide education and explanation on how to use Dyspnea scale       Expected Outcomes Short Term: Able to use Dyspnea scale daily in rehab to express subjective sense of shortness of breath during exertion;Long Term: Able to use Dyspnea scale to guide intensity level when exercising independently       Knowledge and understanding of Target Heart Rate Range (THRR) Yes       Intervention Provide education and explanation of THRR including how the numbers were predicted and where they are located for reference       Expected Outcomes Short Term: Able to state/look up THRR;Short Term: Able to use daily as guideline for intensity in rehab;Long Term: Able to use THRR to govern intensity when exercising independently       Able to check pulse independently Yes       Intervention Provide education and demonstration on how to check pulse in carotid and radial arteries.;Review the importance of being able to check your own pulse for safety during independent exercise       Expected Outcomes Short Term: Able to explain why pulse checking is important during independent exercise;Long Term: Able to check pulse independently and accurately       Understanding of Exercise  Prescription Yes       Intervention Provide education, explanation, and written materials on patient's individual exercise prescription       Expected Outcomes Short Term: Able to explain program exercise prescription;Long Term: Able to explain home exercise prescription to exercise independently                Exercise Goals Re-Evaluation :  Exercise Goals Re-Evaluation     Row Name 04/04/21 0915 04/18/21 1255 05/01/21 1456 05/31/21 1401 06/06/21 1009     Exercise Goal Re-Evaluation   Exercise Goals Review Increase Physical Activity;Able to understand and use rate of perceived exertion (RPE) scale;Knowledge and understanding of Target Heart Rate Range (THRR);Understanding of Exercise Prescription;Increase Strength and Stamina;Able to understand and use Dyspnea scale;Able to check pulse independently Increase Physical Activity;Increase Strength and Stamina -- Increase Physical Activity;Increase Strength and Stamina;Understanding of Exercise Prescription Increase Physical Activity;Able to understand and use rate of perceived exertion (RPE) scale;Knowledge and understanding of Target Heart Rate Range (THRR);Understanding of Exercise Prescription;Increase Strength and Stamina;Able to understand and use Dyspnea scale;Able to check pulse independently   Comments Reviewed RPE and dyspnea scales,  THR and program prescription with pt today.  Pt voiced understanding and was given a copy of goals to take home. Zachary Guerrero has tolerated exercise well so far.  He is up to level 3 on T5. Staff will monitor progress. Out since last review.  Last attended on 04/17/21. Rand returned to rehab this week after eye surgery.  He was able to come back and pick things up again.  We will conitnue to monitor his progress. Reviewed home exercise with pt today.  Pt plans to walk for exercise.  Reviewed THR, pulse, RPE, sign and symptoms, pulse oximetery and when to call 911 or MD.  Also discussed weather considerations and indoor  options.  Pt voiced understanding.   Expected Outcomes Short: Use RPE daily to regulate intensity. Long: Follow program prescription in THR. Short:  attend consistently Long: improve overall stamina -- Short: Return to regular attendance Long: conitnue to follow program precription Short: add 1-2 days a week walking outside of class. Long: become independent with exercise program.    Row Name 06/12/21 1241 06/20/21 0927 06/28/21 0911 07/12/21 1148       Exercise Goal Re-Evaluation   Exercise Goals Review Increase Physical Activity;Increase Strength and Stamina;Understanding of Exercise Prescription Increase Physical Activity;Increase Strength and Stamina;Understanding of Exercise Prescription Increase Physical Activity;Increase Strength and Stamina;Understanding of Exercise Prescription Increase Physical Activity;Increase Strength and Stamina;Understanding of Exercise Prescription    Comments Zachary Guerrero continues to attend since he's been out for eye surgery. His level on the T4 Nustep increased to level 5 and hand weights increased to 6 lbs. He has not increased his workload on the TM and would benefit from doing so. Will continue to monitor. Zachary Guerrero is doing well in rehab. Over weekend, he was able to go to his [place] properties to work on some up keep.  This keeps him active and he is also walking on his off days. He is feeling like his stamina is improving. Zachary Guerrero is doing well in rehab. He increased his overall MET level to 2.7 METs. He also tolerated 6 lb hand weights for resistance training. H has started to get closer to reaching his THR zone. We will continue to monitor his progress in the program. Zachary Guerrero continues to do well in rehab.  He reached a max of 30 laps so far on the track! He also increased to level 4 on the T5 Nustep. RPEs are in appropriate range. Will continue to monitor.    Expected Outcomes Short: Increase speed on treadmill Long: Continue to increase overall MET level Short; Continue to  walk on off days Long;Continue to improve stamina Short; Increase number of laps on track. Long;Continue to increase overall MET levels. Short: Continue to work up laps on track Long: Continue to build up overall strength and stamina             Discharge Exercise Prescription (Final Exercise Prescription Changes):  Exercise Prescription Changes - 07/12/21 1100       Response to Exercise   Blood Pressure (Admit) 104/72    Blood Pressure (Exit) 126/68    Heart Rate (Admit) 63 bpm    Heart Rate (Exercise) 93 bpm    Heart Rate (Exit) 64 bpm    Oxygen Saturation (Admit) 93 %    Oxygen Saturation (Exercise) 89 %    Oxygen Saturation (Exit) 93 %    Rating of Perceived Exertion (Exercise) 15    Perceived Dyspnea (Exercise) 3    Symptoms SOB    Duration Continue  with 30 min of aerobic exercise without signs/symptoms of physical distress.    Intensity THRR unchanged      Progression   Progression Continue to progress workloads to maintain intensity without signs/symptoms of physical distress.    Average METs 2.68      Resistance Training   Training Prescription Yes    Weight 6 lb    Reps 10-15      Interval Training   Interval Training No      Treadmill   MPH 2    Grade 0    Minutes 15    METs 2.63      NuStep   Level 5    Minutes 15    METs 3.7      T5 Nustep   Level 4    Minutes 15    METs 2.1      Track   Laps 30    Minutes 15    METs 2.63      Home Exercise Plan   Plans to continue exercise at Home (comment)   walk   Frequency Add 2 additional days to program exercise sessions.    Initial Home Exercises Provided 06/06/21      Oxygen   Maintain Oxygen Saturation 88% or higher             Nutrition:  Target Goals: Understanding of nutrition guidelines, daily intake of sodium <1563m, cholesterol <2037m calories 30% from fat and 7% or less from saturated fats, daily to have 5 or more servings of fruits and vegetables.  Education: All About  Nutrition: -Group instruction provided by verbal, written material, interactive activities, discussions, models, and posters to present general guidelines for heart healthy nutrition including fat, fiber, MyPlate, the role of sodium in heart healthy nutrition, utilization of the nutrition label, and utilization of this knowledge for meal planning. Follow up email sent as well. Written material given at graduation. [facility]sheet [facility]  Date 04/13/21  [facility] Review Code 1- Verbalizes Understanding       Biometrics:  Pre Biometrics - 04/03/21 1131       Pre Biometrics   Height 5' 11.25" (1.81 m)    Weight 266 lb 11.2 oz (121 kg)    BMI (Calculated) 36.93    Single Leg Stand 4.3 seconds              Nutrition Therapy Plan and Nutrition Goals:  Nutrition Therapy & Goals - 06/20/21 1015       Nutrition Therapy   Diet Heart healthy, low Na, T2DM MNT    Protein (specify units) 90-95g    Fiber 30 grams    Whole Grain Foods 3 servings    Saturated Fats 16 max. grams    Fruits and Vegetables 8 servings/day    Sodium 2 grams      Personal Nutrition Goals   Nutrition Goal ST: add nuts/seeds to cereal, plan meals using MyPlate guidelines, add in satitaing snacks that include fat, fiber, and protein like homemade tzatziki with vegetables and whole wheat pita chips or cottage cheese with fruit.  LT: prevent BG lows, follow MyPlate guidelines, honor hunger.    Comments 89.o. M admitted to pulmonary rehab for COPD. PMHx includes a fib., CAD, HLD, nephrolithiasis, reiter's disease, T2DM, barrett's esophagus. PSHx includes cholecystectomy. Relevant medications includes lantus, metformin, mag-ox, omeprazole, Semaglutide-Weight Management, quetiapine, glimepiride. PYP Score: 57. Vegetables & Fruits 6/12. Breads, Grains &  Cereals 7/12. Red & Processed Meat 6/12. Poultry 2/2. Fish & Shellfish 4/4. Beans, Nuts & Seeds  3/4. Milk & Dairy Foods 6/6. Toppings, Oils, Seasonings & Salt 6/20. Sweets, Snacks & Restaurant Food 9/14. Beverages 8/10. Drinks: Coffee with some cream and water. B: cereal (oatmeal and corn flakes with banana) or hard boiled eggs. He reports getting bored. L: salads (tomatoes, cucumber, onion, olives, boiled egg, radishes) and sandwiches - peanut butter and banana ( whole wheat bread), soup, leftovers, cheeseburger 1x/week, out to eat 1x/week. D: uses olive oil, pinto beans, corn, peaches, cottage cheese, hamburger, chicken mostly, will sometimes go out. He has weight cycled previously and most recently was trying noom to help with weight loss. Encouraged that heart healthy habits with help with his health outcomes even outside of weight loss. Discussed honoring hunger and limiting excess calories in the form of fats like oil and added sugar as these can add calories without necessarily adding satiation. Discussed heart healthy eating and MyPlate guidelines as well as T2DM MNT. Zachary Guerrero reports having low BG semi-frequesntly and usually when he will skip a meal - discussed the importance of eating consistent CHO during the day and speaking with his MD regarding these lows as he is on Lantus. Discussed increase energy and protein needs with COPD.      Intervention Plan   Intervention Prescribe, educate and counsel regarding individualized specific dietary modifications aiming towards targeted core components such as weight, hypertension, lipid management, diabetes, heart failure and other comorbidities.;Nutrition handout(s) given to patient.    Expected Outcomes Short Term Goal: A plan has been developed with personal nutrition goals set during dietitian appointment.;Short Term Goal: Understand basic principles of dietary content, such as calories, fat, sodium, cholesterol and nutrients.;Long Term Goal: Adherence to prescribed nutrition plan.             Nutrition Assessments:  MEDIFICTS Score  Key: ?70 Need to make dietary changes  40-70 Heart Healthy Diet ? 40 Therapeutic Level Cholesterol Diet  Flowsheet Row Pulmonary Rehab from 04/03/2021 in [facility]  Picture Your Plate Total Score on Admission 57      Picture Your Plate Scores: <35 Unhealthy dietary pattern with much room for improvement. 41-50 Dietary pattern unlikely to meet recommendations for good health and room for improvement. 51-60 More healthful dietary pattern, with some room for improvement.  >60 Healthy dietary pattern, although there may be some specific behaviors that could be improved.   Nutrition Goals Re-Evaluation:  Nutrition Goals Re-Evaluation     [place] Name 06/06/21 0958 06/20/21 0930           Goals   Nutrition Goal -- Heart healthy eating plan      Comment Patient has appointment scheduled with dietician on 06/13/2021 Zachary Guerrero missed his appointment and is struggling with his diet.  He is trying to balance out his sugars to help him keeping from getting too low or too high.  We talked about balancing carbs with proteins and fats to avoid the swings. We talked about breakfast options in particular.      Expected Outcome -- Short: Try some new breakfast options and add nuts for protein Long: continue to balance meals with carbs/fats/proteins               Nutrition Goals Discharge (Final Nutrition Goals Re-Evaluation):  Nutrition Goals Re-Evaluation - 06/20/21 0930       Goals   Nutrition Goal Heart healthy eating plan    Comment Zachary Guerrero  missed his appointment and is struggling with his diet.  He is trying to balance out his sugars to help him keeping from getting too low or too high.  We talked about balancing carbs with proteins and fats to avoid the swings. We talked about breakfast options in particular.    Expected Outcome Short: Try some new breakfast options and add nuts for protein Long: continue to balance meals with carbs/fats/proteins              Psychosocial: Target Goals: Acknowledge presence or absence of significant depression and/or stress, maximize coping skills, provide positive support system. Participant is able to verbalize types and ability to use techniques and skills needed for reducing stress and depression.   Education: Stress, Anxiety, and Depression - Group verbal and visual presentation to define topics covered.  Reviews how body is impacted by stress, anxiety, and depression.  Also discusses healthy ways to reduce stress and to treat/manage anxiety and depression.  Written material given at graduation.   Education: Sleep Hygiene -Provides group verbal and written instruction about how sleep can affect your health.  Define sleep hygiene, discuss sleep cycles and impact of sleep habits. Review good sleep hygiene tips.    Initial Review & Psychosocial Screening:  Initial Psych Review & Screening - 03/20/21 1125       Initial Review   Current issues with Current Stress Concerns    Source of Stress Concerns Chronic Illness;Unable to participate in former interests or hobbies;Unable to perform yard/household activities    Comments Worries about getting older and slowing down, frustrating at times      [place]? Yes   wife, children (live in [place], 2 kids, 3 grandchildren)   Comments wants to be able to keep up with grandkids      Barriers   Psychosocial barriers to participate in program The patient should benefit from training in stress management and relaxation.;Psychosocial barriers identified (see note)      Screening Interventions   Interventions Encouraged to exercise;To provide support and resources with identified psychosocial needs;Provide feedback about the scores to participant    Expected Outcomes Short Term goal: Utilizing psychosocial counselor, staff and physician to assist with identification of specific Stressors or current issues interfering with healing  process. Setting desired goal for each stressor or current issue identified.;Long Term Goal: Stressors or current issues are controlled or eliminated.;Short Term goal: Identification and review with participant of any Quality of Life or Depression concerns found by scoring the questionnaire.;Long Term goal: The participant improves quality of Life and PHQ9 Scores as seen by post scores and/or verbalization of changes             Quality of Life Scores:  Scores of 19 and below usually indicate a poorer quality of life in these areas.  A difference of  2-3 points is a clinically meaningful difference.  A difference of 2-3 points in the total score of the Quality of Life Index has been associated with significant improvement in overall quality of life, self-image, physical symptoms, and general health in studies assessing change in quality of life.  PHQ-9: Review Flowsheet       04/03/2021  Depression screen PHQ 2/9  Decreased Interest 0  Down, Depressed, Hopeless 0  PHQ - 2 Score 0  Altered sleeping 2  Tired, decreased energy 1  Change in appetite 1  Feeling bad or failure about yourself  1  Trouble concentrating 0  Suicidal  thoughts 0  PHQ-9 Score 5  Difficult doing work/chores Not difficult at all   Interpretation of Total Score  Total Score Depression Severity:  1-4 = Minimal depression, 5-9 = Mild depression, 10-14 = Moderate depression, 15-19 = Moderately severe depression, 20-27 = Severe depression   Psychosocial Evaluation and Intervention:  Psychosocial Evaluation - 03/20/21 1135       Psychosocial Evaluation & Interventions   Interventions Stress management education;Encouraged to exercise with the program and follow exercise prescription    Comments Zachary Guerrero is coming to pulmonoary rehab for his COPD.  He has a history of smoking but quit in 2012.  His wife and family are his biggest supporters.  He has two kids that live in [place] and 3 grandkids that he wants to be  able to keep up with and being around for.  His biggest frustrations are not being to do what he needs to and wants to do.  He used to be able to hike for 3 miles with his wife each week, but no longer able to go that long.  He really wants to get his health better overall and have mroe energy.  He is also aiming to lose weight with a goal of 20 lbs.  He is also scheduled for cataract surgery in March and hoping that will help him be able to see better again.  He is very excited about the program and looking forward to working towards feeling better again.             Psychosocial Re-Evaluation:  Psychosocial Re-Evaluation     [place] Name 06/06/21 0955 06/20/21 0929           Psychosocial Re-Evaluation   Current issues with Current Sleep Concerns Current Sleep Concerns;Current Stress Concerns      Comments Patient reports no new stress mental health concerns to reports. Zachary Guerrero is doing well in rehab. 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  He was down at beach cleaning up some over the weekend.  He is sleep is getting better and he is feeling better overall.      Expected Outcomes Short: continue to attend pulmonary rehab for mental health benefits. Long: Manintain good mental health and sleep habits. Short: Conitnue to exercise for mental boost Long: Continue to stay positive.      Interventions Encouraged to attend Pulmonary Rehabilitation for the exercise Encouraged to attend Pulmonary Rehabilitation for the exercise      Continue Psychosocial Services  Follow up required by staff Follow up required by staff               Psychosocial Discharge (Final Psychosocial Re-Evaluation):  Psychosocial Re-Evaluation - 06/20/21 0929       Psychosocial Re-Evaluation   Current issues with Current Sleep Concerns;Current Stress Concerns    Comments Zachary Guerrero is doing well in rehab. 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  He was down at beach cleaning up some over the weekend.  He is sleep is getting better and he is feeling better overall.    Expected Outcomes Short: Conitnue to exercise for mental boost Long: Continue to stay positive.    Interventions Encouraged to attend Pulmonary Rehabilitation for the exercise    Continue Psychosocial Services  Follow up required by staff             Education: Education Goals: Education classes will be provided on  a weekly basis, covering required topics. Participant will state understanding/return demonstration of topics presented.  Learning Barriers/Preferences:  Learning Barriers/Preferences - 03/20/21 1124       Learning Barriers/Preferences   Learning Barriers Sight   needs cataract surgery (March)   Learning Preferences None             General Pulmonary Education Topics:  Infection Prevention: - Provides verbal and written material to individual with discussion of infection control including proper hand washing and proper equipment cleaning during exercise session. Flowsheet Row Pulmonary Rehab from 06/14/2021 in [facility] Cardiac and Pulmonary Rehab  Date 04/03/21  Educator AS  Instruction Review Code 1- Verbalizes Understanding       Falls Prevention: - Provides verbal and written material to individual with discussion of falls prevention and safety. Flowsheet Row Pulmonary Rehab from 06/14/2021 in [facility] Cardiac and Pulmonary Rehab  Date 04/03/21  Educator AS  Instruction Review Code 1- Verbalizes Understanding       Chronic Lung Disease Review: - Group verbal instruction with posters, models, PowerPoint presentations and videos,  to review new updates, new respiratory medications, new advancements in procedures and treatments. Providing information on websites and "800" numbers for continued self-education. Includes information about supplement oxygen, available portable oxygen systems, continuous and intermittent flow rates, oxygen  safety, concentrators, and Medicare reimbursement for oxygen. Explanation of Pulmonary Drugs, including class, frequency, complications, importance of spacers, rinsing mouth after steroid MDI's, and proper cleaning methods for nebulizers. Review of basic lung anatomy and physiology related to function, structure, and complications of lung disease. Review of risk factors. Discussion about methods for diagnosing sleep apnea and types of masks and machines for OSA. Includes a review of the use of types of environmental controls: home humidity, furnaces, filters, dust mite/pet prevention, HEPA vacuums. Discussion about weather changes, air quality and the benefits of nasal washing. Instruction on Warning signs, infection symptoms, calling MD promptly, preventive modes, and value of vaccinations. Review of effective airway clearance, coughing and/or vibration techniques. Emphasizing that all should Create an Action Plan. Written material given at graduation.   AED/CPR: - Group verbal and written instruction with the use of models to demonstrate the basic use of the AED with the basic ABC's of resuscitation.    Anatomy and Cardiac Procedures: - Group verbal and visual presentation and models provide information about basic cardiac anatomy and function. Reviews the testing methods done to diagnose heart disease and the outcomes of the test results. Describes the treatment choices: Medical Management, Angioplasty, or Coronary Bypass Surgery for treating various heart conditions including Myocardial Infarction, Angina, Valve Disease, and Cardiac Arrhythmias.  Written material given at graduation. Flowsheet Row Pulmonary Rehab from 06/14/2021 in [facility] Cardiac and Pulmonary Rehab  Date 04/06/21  Educator SB  Instruction Review Code 1- Verbalizes Understanding       Medication Safety: - Group verbal and visual instruction to review commonly prescribed medications for heart and lung disease. Reviews the  medication, class of the drug, and side effects. Includes the steps to properly store meds and maintain the prescription regimen.  Written material given at graduation.   Other: -Provides group and verbal instruction on various topics (see comments)   Knowledge Questionnaire Score:  Knowledge Questionnaire Score - 04/03/21 1135       Knowledge Questionnaire Score   Pre Score 16/18              Core Components/Risk Factors/Patient Goals at Admission:  Personal Goals and Risk Factors at Admission -  04/03/21 1139       Core Components/Risk Factors/Patient Goals on Admission    Weight Management Yes;Obesity;Weight Loss    Intervention Weight Management: Develop a combined nutrition and exercise program designed to reach desired caloric intake, while maintaining appropriate intake of nutrient and fiber, sodium and fats, and appropriate energy expenditure required for the weight goal.;Weight Management: Provide education and appropriate resources to help participant work on and attain dietary goals.;Weight Management/Obesity: Establish reasonable short term and long term weight goals.;Obesity: Provide education and appropriate resources to help participant work on and attain dietary goals.    Admit Weight 266 lb 11.2 oz (121 kg)    Goal Weight: Short Term 255 lb (115.7 kg)    Goal Weight: Long Term 240 lb (108.9 kg)    Expected Outcomes Short Term: Continue to assess and modify interventions until short term weight is achieved;Long Term: Adherence to nutrition and physical activity/exercise program aimed toward attainment of established weight goal;Weight Loss: Understanding of general recommendations for a balanced deficit meal plan, which promotes 1-2 lb weight loss per week and includes a negative energy balance of [phone] kcal/d;Understanding recommendations for meals to include 15-35% energy as protein, 25-35% energy from fat, 35-60% energy from carbohydrates, less than $RemoveB'200mg'[id number]$  of dietary  cholesterol, 20-35 gm of total fiber daily;Understanding of distribution of calorie intake throughout the day with the consumption of 4-5 meals/snacks    Improve shortness of breath with ADL's Yes    Intervention Provide education, individualized exercise plan and daily activity instruction to help decrease symptoms of SOB with activities of daily living.    Expected Outcomes Short Term: Improve cardiorespiratory fitness to achieve a reduction of symptoms when performing ADLs;Long Term: Be able to perform more ADLs without symptoms or delay the onset of symptoms    Increase knowledge of respiratory medications and ability to use respiratory devices properly  Yes    Intervention Provide education and demonstration as needed of appropriate use of medications, inhalers, and oxygen therapy.    Expected Outcomes Short Term: Achieves understanding of medications use. Understands that oxygen is a medication prescribed by physician. Demonstrates appropriate use of inhaler and oxygen therapy.;Long Term: Maintain appropriate use of medications, inhalers, and oxygen therapy.    Diabetes Yes    Intervention Provide education about signs/symptoms and action to take for hypo/hyperglycemia.;Provide education about proper nutrition, including hydration, and aerobic/resistive exercise prescription along with prescribed medications to achieve blood glucose in normal ranges: Fasting glucose 65-99 mg/dL    Expected Outcomes Short Term: Participant verbalizes understanding of the signs/symptoms and immediate care of hyper/hypoglycemia, proper foot care and importance of medication, aerobic/resistive exercise and nutrition plan for blood glucose control.;Long Term: Attainment of HbA1C < 7%.    Hypertension Yes    Intervention Provide education on lifestyle modifcations including regular physical activity/exercise, weight management, moderate sodium restriction and increased consumption of fresh fruit, vegetables, and low fat  dairy, alcohol moderation, and smoking cessation.;Monitor prescription use compliance.    Expected Outcomes Short Term: Continued assessment and intervention until BP is < 140/40mm HG in hypertensive participants. < 130/30mm HG in hypertensive participants with diabetes, heart failure or chronic kidney disease.;Long Term: Maintenance of blood pressure at goal levels.    Lipids Yes    Intervention Provide education and support for participant on nutrition & aerobic/resistive exercise along with prescribed medications to achieve LDL '70mg'$ , HDL >$Remo'40mg'pnqxN$ .    Expected Outcomes Short Term: Participant states understanding of desired cholesterol values and is compliant with medications prescribed.  Participant is following exercise prescription and nutrition guidelines.;Long Term: Cholesterol controlled with medications as prescribed, with individualized exercise RX and with personalized nutrition plan. Value goals: LDL < $Rem'70mg'hTdv$ , HDL > 40 mg.             Education:Diabetes - Individual verbal and written instruction to review signs/symptoms of diabetes, desired ranges of glucose level fasting, after meals and with exercise. Acknowledge that pre and post exercise glucose checks will be done for 3 sessions at entry of program. Flowsheet Row Pulmonary Rehab from 03/20/2021 in [facility] Cardiac and Pulmonary Rehab  Date 03/20/21  Educator [facility]  Instruction Review Code 1- Verbalizes Understanding       Know Your Numbers and Heart Failure: - Group verbal and visual instruction to discuss disease risk factors for cardiac and pulmonary disease and treatment options.  Reviews associated critical values for Overweight/Obesity, Hypertension, Cholesterol, and Diabetes.  Discusses basics of heart failure: signs/symptoms and treatments.  Introduces Heart Failure Zone chart for action plan for heart failure.  Written material given at graduation.   Core Components/Risk Factors/Patient Goals Review:   Goals and Risk Factor  Review     Row Name 06/06/21 0959 06/20/21 0935           Core Components/Risk Factors/Patient Goals Review   Personal Goals Review Weight Management/Obesity;Increase knowledge of respiratory medications and ability to use respiratory devices properly.;Lipids;Diabetes;Improve shortness of breath with ADL's;Hypertension;Develop more efficient breathing techniques such as purse lipped breathing and diaphragmatic breathing and practicing self-pacing with activity. Weight Management/Obesity;Increase knowledge of respiratory medications and ability to use respiratory devices properly.;Lipids;Diabetes;Improve shortness of breath with ADL's;Hypertension;Develop more efficient breathing techniques such as purse lipped breathing and diaphragmatic breathing and practicing self-pacing with activity.      Review Patient reports that his weight has been steady. He takes all meds as prescribed. Lipids, BP, and blood sugar are reported to be in acceptable ranges. He reports that he has seen some small improvements in SOB and is comfortable monitoring SAO2 levels at home and using PLB when SOB. Zachary Guerrero is having swings with his sugars and trying to find a balance.  We talked about some options to help balance meals.  He is disappointed that he is not losing weight with [place] for 5 months.  He was really hoping it would go down.  His breathing endurance is improving and he is able to do more at home.  His pressures have been good. He is doing well with resp meds.      Expected Outcomes Short: continue to take all meds as prescribed and continue to montior wt, BG, BS at home. Long: Continue to montior risk factors. Short: COntinue to work on balance for sugars Long: Continue to work on [employer] loss               Core Components/Risk Factors/Patient Goals at Discharge (Final Review):   Goals and Risk Factor Review - 06/20/21 0935       Core Components/Risk Factors/Patient Goals Review   Personal Goals Review  Weight Management/Obesity;Increase knowledge of respiratory medications and ability to use respiratory devices properly.;Lipids;Diabetes;Improve shortness of breath with ADL's;Hypertension;Develop more efficient breathing techniques such as purse lipped breathing and diaphragmatic breathing and practicing self-pacing with activity.    Review Zachary Guerrero is having swings with his sugars and trying to find a balance.  We talked about some options to help balance meals.  He is disappointed that he is not losing weight with [place] for 5 months.  He was  really hoping it would go down.  His breathing endurance is improving and he is able to do more at home.  His pressures have been good. He is doing well with resp meds.    Expected Outcomes Short: COntinue to work on balance for sugars Long: Continue to work on weight loss             ITP Comments:  ITP Comments     Row Name 03/20/21 1148 04/03/21 1138 04/04/21 0915 04/19/21 0853 05/01/21 1455   ITP Comments Completed virtual orientation today.  EP evaluation is scheduled for Monday 3/6 at 9am.  Documentation for diagnosis can be found in [facility] encounter 01/03/21. Completed 6MWT and gym orientation. Initial ITP created and sent for review to Dr. Ottie Glazier, Medical Director. First full day of exercise!  Patient was oriented to gym and equipment including functions, settings, policies, and procedures.  Patient's individual exercise prescription and treatment plan were reviewed.  All starting workloads were established based on the results of the 6 minute walk test done at initial orientation visit.  The plan for exercise progression was also introduced and progression will be customized based on patient's performance and goals. 30 Day review completed. Medical Director ITP review done, changes made as directed, and signed approval by Medical Director.   New to program Pt has been out since 04/17/21.  He had his first cataract surgery on 3/22 and second one  scheduled on 4/4.    [place] Name 05/11/21 1447 05/16/21 1232 05/17/21 1357 06/14/21 0906 06/20/21 1122   ITP Comments Patient has been out for cataract surgery dated from 4/4. He will return to rehab next Tuesday, 4/18. Zachary Guerrero has not yet returned after cataract surgery. 30 Day review completed. Medical Director ITP review done, changes made as directed, and signed approval by Medical Director. 30 Day review completed. Medical Director ITP review done, changes made as directed, and signed approval by Medical Director. Completed intial RD consultation    [place] Name 07/12/21 1330           ITP Comments 30 Day review completed. Medical Director ITP review done, changes made as directed, and signed approval by Medical Director.                Comments:

## 2021-07-13 DIAGNOSIS — J449 Chronic obstructive pulmonary disease, unspecified: Secondary | ICD-10-CM

## 2021-07-13 NOTE — Progress Notes (Signed)
Daily Session Note  Patient Details  Name: Zachary Guerrero MRN: [id number] Date of Birth: 20-Mar-1945 Referring Provider:   April Manson Pulmonary Rehab from 04/03/2021 in [facility] Cardiac and Pulmonary Rehab  Referring Provider [place]       Encounter Date: 07/13/2021  Check In:  Session Check In - 07/13/21 0936       Check-In   Supervising physician immediately available to respond to emergencies See telemetry face sheet for immediately available ER MD    Location [facility]-Cardiac & Pulmonary Rehab    Staff Present Vida Rigger, RN, BSN;Melissa McBride, RDN, Tawanna Solo, MS, ASCM CEP, Exercise Physiologist;Joseph Tessie Fass, [place]    Virtual Visit No    Medication changes reported     No    Fall or balance concerns reported    No    Tobacco Cessation No Change    Warm-up and Cool-down Performed on first and last piece of equipment    Resistance Training Performed Yes    VAD Patient? No    PAD/SET Patient? No      Pain Assessment   Currently in Pain? No/denies                Social History   Tobacco Use  Smoking Status Former   Packs/day: 2.00   Years: 20.00   Total pack years: 40.00   Types: Cigarettes   Quit date: 03/31/2010   Years since quitting: 11.2  Smokeless Tobacco Former    Goals Met:  Proper associated with RPD/PD & O2 Sat Independence with exercise equipment Using PLB without cueing & demonstrates good technique Exercise tolerated well No report of concerns or symptoms today Strength training completed today  Goals Unmet:  Not Applicable  Comments: Pt able to follow exercise prescription today without complaint.  Will continue to monitor for progression.   Dr. Emily Filbert is Medical Director for [place].  Dr. Ottie Glazier is Medical Director for [facility] Pulmonary Rehabilitation.

## 2021-07-18 DIAGNOSIS — J449 Chronic obstructive pulmonary disease, unspecified: Secondary | ICD-10-CM | POA: Diagnosis not present

## 2021-07-18 NOTE — Progress Notes (Signed)
Daily Session Note  Patient Details  Name: Zachary Guerrero MRN: [id number] Date of Birth: Feb 23, 1945 Referring Provider:   April Manson Pulmonary Rehab from 04/03/2021 in [facility] Cardiac and Pulmonary Rehab  Referring Provider Sparks       Encounter Date: 07/18/2021  Check In:  Session Check In - 07/18/21 0916       Check-In   Supervising physician immediately available to respond to emergencies See telemetry face sheet for immediately available ER MD    Location [facility]-Cardiac & Pulmonary Rehab    Staff Present Earlean Shawl, BS, ACSM CEP, Exercise Physiologist;Melissa [place], RDN, Luther Redo, MPA, RN    Virtual Visit No    Medication changes reported     No    Fall or balance concerns reported    No    Tobacco Cessation No Change    Warm-up and Cool-down Performed on first and last piece of equipment    Resistance Training Performed Yes    VAD Patient? No    PAD/SET Patient? No      Pain Assessment   Currently in Pain? No/denies                Social History   Tobacco Use  Smoking Status Former   Packs/day: 2.00   Years: 20.00   Total pack years: 40.00   Types: Cigarettes   Quit date: 03/31/2010   Years since quitting: 11.3  Smokeless Tobacco Former    Goals Met:  Independence with exercise equipment Exercise tolerated well No report of concerns or symptoms today Strength training completed today  Goals Unmet:  Not Applicable  Comments: Pt able to follow exercise prescription today without complaint.  Will continue to monitor for progression.    Dr. Emily Filbert is Medical Director for [place].  Dr. Ottie Glazier is Medical Director for [facility] Pulmonary Rehabilitation.

## 2021-07-19 DIAGNOSIS — J449 Chronic obstructive pulmonary disease, unspecified: Secondary | ICD-10-CM | POA: Diagnosis not present

## 2021-07-19 NOTE — Progress Notes (Signed)
Daily Session Note  Patient Details  Name: Zachary Guerrero MRN: [id number] Date of Birth: 05/27/1945 Referring Provider:   Flowsheet Row Pulmonary Rehab from 04/03/2021 in [facility] Cardiac and Pulmonary Rehab  Referring Provider [place]       Encounter Date: 07/19/2021  Check In:  Session Check In - 07/19/21 0923       Check-In   Supervising physician immediately available to respond to emergencies See telemetry face sheet for immediately available ER MD    Location [facility]-Cardiac & Pulmonary Rehab    Staff Present Kristen Coble, RN,BC,MSN;Kelly Bollinger, MPA, RN;Laureen Brown, BS, RRT, CPFT    Virtual Visit No    Medication changes reported     No    Fall or balance concerns reported    No    Tobacco Cessation No Change    Warm-up and Cool-down Performed on first and last piece of equipment    Resistance Training Performed Yes    VAD Patient? No    PAD/SET Patient? No      Pain Assessment   Currently in Pain? No/denies                Social History   Tobacco Use  Smoking Status Former   Packs/day: 2.00   Years: 20.00   Total pack years: 40.00   Types: Cigarettes   Quit date: 03/31/2010   Years since quitting: 11.3  Smokeless Tobacco Former    Goals Met:  Independence with exercise equipment Exercise tolerated well No report of concerns or symptoms today Strength training completed today  Goals Unmet:  Not Applicable  Comments: Pt able to follow exercise prescription today without complaint.  Will continue to monitor for progression.    Dr. Mark Miller is Medical Director for [facility].  Dr. Fuad Aleskerov is Medical Director for [facility] Pulmonary Rehabilitation. 

## 2021-07-20 DIAGNOSIS — J449 Chronic obstructive pulmonary disease, unspecified: Secondary | ICD-10-CM | POA: Diagnosis not present

## 2021-07-20 NOTE — Progress Notes (Signed)
Daily Session Note  Patient Details  Name: Zachary Guerrero MRN: [id number] Date of Birth: 05-16-1945 Referring Provider:   April Manson Pulmonary Rehab from 04/03/2021 in [facility] Cardiac and Pulmonary Rehab  Referring Provider [place]       Encounter Date: 07/20/2021  Check In:  Session Check In - 07/20/21 5207       Check-In   Supervising physician immediately available to respond to emergencies See telemetry face sheet for immediately available ER MD    Location [facility]-Cardiac & Pulmonary Rehab    Staff Present Carson Myrtle, BS, RRT, CPFT;Joseph Karie Fetch, MPA, RN    Virtual Visit No    Medication changes reported     No    Fall or balance concerns reported    No    Tobacco Cessation No Change    Warm-up and Cool-down Performed on first and last piece of equipment    Resistance Training Performed Yes    VAD Patient? No    PAD/SET Patient? No      Pain Assessment   Currently in Pain? No/denies                Social History   Tobacco Use  Smoking Status Former   Packs/day: 2.00   Years: 20.00   Total pack years: 40.00   Types: Cigarettes   Quit date: 03/31/2010   Years since quitting: 11.3  Smokeless Tobacco Former    Goals Met:  Independence with exercise equipment Exercise tolerated well No report of concerns or symptoms today Strength training completed today  Goals Unmet:  Not Applicable  Comments: Pt able to follow exercise prescription today without complaint.  Will continue to monitor for progression.    Dr. Emily Filbert is Medical Director for [place].  Dr. Ottie Glazier is Medical Director for [facility] Pulmonary Rehabilitation.

## 2021-07-25 DIAGNOSIS — J449 Chronic obstructive pulmonary disease, unspecified: Secondary | ICD-10-CM

## 2021-07-26 ENCOUNTER — Encounter
Admission: RE | Disposition: A | Discharge status: Home / Self Care | Payer: Self-pay | Source: Home / Self Care | Attending: Internal Medicine

## 2021-07-26 ENCOUNTER — Ambulatory Visit: Payer: Medicare PPO | Admitting: Certified Registered"

## 2021-07-26 ENCOUNTER — Other Ambulatory Visit: Payer: Self-pay

## 2021-07-26 ENCOUNTER — Encounter: Payer: Self-pay | Admitting: Internal Medicine

## 2021-07-26 ENCOUNTER — Ambulatory Visit
Admission: RE | Admit: 2021-07-26 | Discharge: 2021-07-26 | Disposition: A | Discharge status: Home / Self Care | Payer: Medicare PPO | Attending: Internal Medicine | Admitting: Internal Medicine

## 2021-07-26 DIAGNOSIS — E7800 Pure hypercholesterolemia, unspecified: Secondary | ICD-10-CM | POA: Insufficient documentation

## 2021-07-26 DIAGNOSIS — I10 Essential (primary) hypertension: Secondary | ICD-10-CM | POA: Diagnosis not present

## 2021-07-26 DIAGNOSIS — I480 Paroxysmal atrial fibrillation: Secondary | ICD-10-CM | POA: Diagnosis present

## 2021-07-26 DIAGNOSIS — I2510 Atherosclerotic heart disease of native coronary artery without angina pectoris: Secondary | ICD-10-CM | POA: Insufficient documentation

## 2021-07-26 DIAGNOSIS — J449 Chronic obstructive pulmonary disease, unspecified: Secondary | ICD-10-CM | POA: Diagnosis not present

## 2021-07-26 DIAGNOSIS — E119 Type 2 diabetes mellitus without complications: Secondary | ICD-10-CM | POA: Diagnosis not present

## 2021-07-26 DIAGNOSIS — Z951 Presence of aortocoronary bypass graft: Secondary | ICD-10-CM | POA: Diagnosis not present

## 2021-07-26 DIAGNOSIS — G4733 Obstructive sleep apnea (adult) (pediatric): Secondary | ICD-10-CM | POA: Insufficient documentation

## 2021-07-26 DIAGNOSIS — Z794 Long term (current) use of insulin: Secondary | ICD-10-CM | POA: Diagnosis not present

## 2021-07-26 DIAGNOSIS — Z7984 Long term (current) use of oral hypoglycemic drugs: Secondary | ICD-10-CM | POA: Insufficient documentation

## 2021-07-26 DIAGNOSIS — G4730 Sleep apnea, unspecified: Secondary | ICD-10-CM | POA: Diagnosis not present

## 2021-07-26 DIAGNOSIS — K219 Gastro-esophageal reflux disease without esophagitis: Secondary | ICD-10-CM | POA: Diagnosis not present

## 2021-07-26 DIAGNOSIS — Z87891 Personal history of nicotine dependence: Secondary | ICD-10-CM | POA: Insufficient documentation

## 2021-07-26 HISTORY — PX: CARDIOVERSION: SHX1299

## 2021-07-26 LAB — GLUCOSE, CAPILLARY: Glucose-Capillary: 136 mg/dL — ABNORMAL HIGH (ref 70–99)

## 2021-07-26 SURGERY — CARDIOVERSION
Anesthesia: General

## 2021-07-26 MED ORDER — LIDOCAINE HCL (PF) 2 % IJ SOLN
INTRAMUSCULAR | Status: AC
  Filled 2021-07-26: qty 5

## 2021-07-26 MED ORDER — PROPOFOL 1000 MG/100ML IV EMUL
INTRAVENOUS | Status: AC
Start: 2021-07-26 — End: ?
  Filled 2021-07-26: qty 100

## 2021-07-26 MED ORDER — SODIUM CHLORIDE 0.9 % IV SOLN: INTRAVENOUS | Status: DC

## 2021-07-26 MED ORDER — PROPOFOL 10 MG/ML IV BOLUS
INTRAVENOUS | Status: DC | PRN
  Administered 2021-07-26: 50 mg via INTRAVENOUS

## 2021-07-26 NOTE — CV Procedure (Signed)
Electrical Cardioversion Procedure Note Zachary Guerrero [id number] [birth year]  Procedure: Electrical Cardioversion Indications:  Paroxysmal non valvular atrial fibrillation  Procedure Details Consent: Risks of procedure as well as the alternatives and risks of each were explained to the (patient/caregiver).  Consent for procedure obtained. Time Out: Verified patient identification, verified procedure, site/side was marked, verified correct patient position, special equipment/implants available, medications/allergies/relevent history reviewed, required imaging and test results available.  Performed  Patient placed on cardiac monitor, pulse oximetry, supplemental oxygen as necessary.  Sedation given: Propofol and versed as per anesthesia  Pacer pads placed anterior and posterior chest.  Cardioverted 2 time(s).  Cardioverted at 150J.  Evaluation Findings: Post procedure EKG shows: NSR Complications: None Patient did tolerate procedure well.   Arnoldo Hooker M.D. [facility] 07/26/2021, 7:40 AM

## 2021-07-26 NOTE — Transfer of Care (Signed)
Immediate Anesthesia Transfer of Care Note  Patient: Zachary Guerrero  Procedure(s) Performed: CARDIOVERSION  Patient Location: spu  Anesthesia Type:General  Level of Consciousness: awake  Airway & Oxygen Therapy: Patient Spontanous Breathing and Patient connected to nasal cannula oxygen  Post-op Assessment: Report given to RN and Post -op Vital signs reviewed and stable  Post vital signs: Reviewed  Last Vitals:  Vitals Value Taken Time  BP 126/77 07/26/21 0738  Temp    Pulse 60 07/26/21 0742  Resp 16 07/26/21 0742  SpO2 97 % 07/26/21 0742  Vitals shown include unvalidated device data.  Last Pain:  Vitals:   07/26/21 0650  TempSrc: Oral  PainSc: 0-No pain         Complications: No notable events documented.

## 2021-07-26 NOTE — Anesthesia Preprocedure Evaluation (Signed)
Anesthesia Evaluation  Patient identified by MRN, date of birth, ID band Patient awake    Reviewed: Allergy & Precautions, NPO status , Patient's Chart, lab work & pertinent test results  History of Anesthesia Complications Negative for: history of anesthetic complications  Airway Mallampati: III  TM Distance: <3 FB Neck ROM: full    Dental  (+) Chipped, Poor Dentition, Missing, Partial Upper   Pulmonary shortness of breath and with exertion, sleep apnea and Continuous Positive Airway Pressure Ventilation , pneumonia, COPD, former smoker,     + decreased breath sounds      Cardiovascular hypertension, + CAD  + dysrhythmias Atrial Fibrillation  Rhythm:irregular Rate:Normal     Neuro/Psych negative neurological ROS  negative psych ROS   GI/Hepatic negative GI ROS, Neg liver ROS, neg GERD  ,  Endo/Other  diabetes, Type 2  Renal/GU Renal disease  negative genitourinary   Musculoskeletal   Abdominal   Peds  Hematology negative hematology ROS (+)   Anesthesia Other Findings Past Medical History: 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 No date: Chronic kidney disease     Comment:  KIDNEY STONES No date: COPD (chronic obstructive pulmonary disease) (HCC) No date: Coronary artery disease No date: Diabetes mellitus without complication (HCC) No date: Dysrhythmia     Comment:  A-FIB/DR KOWALSKI No date: Hypercholesteremia No date: Hypertension     Comment:  CONTROLLED ON MEDS No date: Pneumonia     Comment:  IN PAST 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 No date: Shortness of breath dyspnea     Comment:  WITH EXERTION No date: Sleep apnea     Comment:  C-PAP No date: Wears dentures     Comment:  UPPERS  Past Surgical History: No date: ARM SURGERY     Comment:  FRACTURED 01/04/2015: BROW LIFT; Bilateral     Comment:  Procedure: BLEPHAROPLASTY;  Surgeon: Imagene Riches, MD;                Location: [facility] SURGERY  CNTR;  Service: Ophthalmology;                Laterality: Bilateral;  DIABETIC-INSULIN AND ORAL               MEDS/CPAP No date: CARDIAC CATHETERIZATION No date: CARDIAC SURGERY 04/18/2021: CATARACT EXTRACTION W/PHACO; Left     Comment:  Procedure: CATARACT EXTRACTION PHACO AND INTRAOCULAR               LENS PLACEMENT (IOC) LEFT DIABETIC 16.56 01:30.8;                Surgeon: Galen Manila, MD;  Location: [facility] SURGERY              CNTR;  Service: Ophthalmology;  Laterality: Left;                Diabetic 05/02/2021: CATARACT EXTRACTION W/PHACO; Right     Comment:  Procedure: CATARACT EXTRACTION PHACO AND INTRAOCULAR               LENS PLACEMENT (IOC) RIGHT DIABETIC;  Surgeon: Galen Manila, MD;  Location: [facility] SURGERY CNTR;  Service:               Ophthalmology;  Laterality: Right;  6.98 0:55.9 No date: CHOLECYSTECTOMY 12/26/2016: COLONOSCOPY WITH PROPOFOL; N/A     Comment:  Procedure: COLONOSCOPY WITH PROPOFOL;  Surgeon: Brice Prairie,  Boykin Nearing, MD;  Location: [facility] ENDOSCOPY;  Service:               Gastroenterology;  Laterality: N/A; No date: CORONARY ARTERY BYPASS GRAFT     Comment:  X4 01/04/2015: ECTROPION REPAIR; Bilateral     Comment:  Procedure: REPAIR OF ECTROPION EXTENSIVE;  Surgeon: Imagene Riches, MD;  Location: [facility] SURGERY CNTR;  Service:               Ophthalmology;  Laterality: Bilateral; 12/26/2016: ESOPHAGOGASTRODUODENOSCOPY (EGD) WITH PROPOFOL; N/A     Comment:  Procedure: ESOPHAGOGASTRODUODENOSCOPY (EGD) WITH               PROPOFOL;  Surgeon: Tol[place], Boykin Nearing, MD;  Location:               [facility] ENDOSCOPY;  Service: Gastroenterology;  Laterality:               N/A; 03/30/2020: ESOPHAGOGASTRODUODENOSCOPY (EGD) WITH PROPOFOL; N/A     Comment:  Procedure: ESOPHAGOGASTRODUODENOSCOPY (EGD) WITH               PROPOFOL;  Surgeon: Toledo, Boykin Nearing, MD;  Location:               [facility] ENDOSCOPY;  Service: Gastroenterology;   Laterality:               N/A; No date: HERNIA REPAIR 01/04/2015: PTOSIS REPAIR; Bilateral     Comment:  Procedure: PTOSIS REPAIR;  Surgeon: Imagene Riches, MD;                Location: [facility] SURGERY CNTR;  Service: Ophthalmology;                Laterality: Bilateral; No date: TOE SURGERY  BMI    Body Mass Index: 37.66 kg/m      Reproductive/Obstetrics negative OB ROS                             Anesthesia Physical Anesthesia Plan  ASA: 3  Anesthesia Plan: General   Post-op Pain Management:    Induction: Intravenous  PONV Risk Score and Plan: Propofol infusion and TIVA  Airway Management Planned: Natural Airway and Nasal Cannula  Additional Equipment:   Intra-op Plan:   Post-operative Plan:   Informed Consent: I have reviewed the patients History and Physical, chart, labs and discussed the procedure including the risks, benefits and alternatives for the proposed anesthesia with the patient or authorized representative who has indicated his/her understanding and acceptance.     Dental Advisory Given  Plan Discussed with: Anesthesiologist, CRNA and Surgeon  Anesthesia Plan Comments: (Patient consented for risk of leaving partial in and opted to proceed with his partial in for the procedure.  Patient consented for risks of anesthesia including but not limited to:  - adverse reactions to medications - risk of airway placement if required - damage to eyes, teeth, lips or other oral mucosa - nerve damage due to positioning  - sore throat or hoarseness - Damage to heart, brain, nerves, lungs, other parts of body or loss of life  Patient voiced understanding.)        Anesthesia Quick Evaluation

## 2021-07-26 NOTE — Anesthesia Postprocedure Evaluation (Signed)
Anesthesia Post Note  Patient: Zachary Guerrero  Procedure(s) Performed: CARDIOVERSION  Patient location during evaluation: [facility] Recovery Anesthesia Type: General Level of consciousness: awake and alert Pain management: pain level controlled Vital Signs Assessment: post-procedure vital signs reviewed and stable Respiratory status: spontaneous breathing, nonlabored ventilation, respiratory function stable and patient connected to nasal cannula oxygen Cardiovascular status: blood pressure returned to baseline and stable Postop Assessment: no apparent nausea or vomiting Anesthetic complications: no   No notable events documented.   Last Vitals:  Vitals:   07/26/21 0748 07/26/21 0800  BP: 114/61   Pulse: (!) 57 (!) 57  Resp: 16 16  Temp:    SpO2: 98% 98%    Last Pain:  Vitals:   07/26/21 0650  TempSrc: Oral  PainSc: 0-No pain                 Cleda Mccreedy Getsemani Lindon

## 2021-08-02 ENCOUNTER — Encounter: Payer: Medicare PPO | Attending: Internal Medicine

## 2021-08-02 DIAGNOSIS — J449 Chronic obstructive pulmonary disease, unspecified: Secondary | ICD-10-CM | POA: Insufficient documentation

## 2021-08-08 DIAGNOSIS — J449 Chronic obstructive pulmonary disease, unspecified: Secondary | ICD-10-CM

## 2021-08-08 NOTE — Progress Notes (Signed)
Daily Session Note  Patient Details  Name: OKLEY MAGNUSSEN MRN: [id number] Date of Birth: 1945/02/16 Referring Provider:   April Manson Pulmonary Rehab from 04/03/2021 in [facility] Cardiac and Pulmonary Rehab  Referring Provider [place]       Encounter Date: 08/08/2021  Check In:  Session Check In - 08/08/21 0925       Check-In   Supervising physician immediately available to respond to emergencies See telemetry face sheet for immediately available ER MD    Location [facility]-Cardiac & Pulmonary Rehab    Staff Present Earlean Shawl, BS, ACSM CEP, Exercise Physiologist;Lindsay Soulliere Rosalia Hammers, MPA, RN;Jessica Luan Pulling, MA, RCEP, CCRP, CCET    Virtual Visit No    Medication changes reported     No    Fall or balance concerns reported    No    Tobacco Cessation No Change    Warm-up and Cool-down Performed on first and last piece of equipment    Resistance Training Performed Yes    VAD Patient? No    PAD/SET Patient? No      Pain Assessment   Currently in Pain? No/denies                Social History   Tobacco Use  Smoking Status Former   Packs/day: 2.00   Years: 20.00   Total pack years: 40.00   Types: Cigarettes   Quit date: 03/31/2010   Years since quitting: 11.3  Smokeless Tobacco Former    Goals Met:  Independence with exercise equipment Exercise tolerated well No report of concerns or symptoms today Strength training completed today  Goals Unmet:  Not Applicable  Comments: Pt able to follow exercise prescription today without complaint.  Will continue to monitor for progression.    Dr. Emily Filbert is Medical Director for [place].  Dr. Ottie Glazier is Medical Director for [facility] Pulmonary Rehabilitation.

## 2021-08-09 ENCOUNTER — Encounter: Payer: Self-pay | Admitting: *Deleted

## 2021-08-09 DIAGNOSIS — J449 Chronic obstructive pulmonary disease, unspecified: Secondary | ICD-10-CM

## 2021-08-09 NOTE — Progress Notes (Signed)
Pulmonary Individual Treatment Plan  Patient Details  Name: Zachary Guerrero MRN: [id number] Date of Birth: [birth year] Referring Provider:   Flowsheet Row Pulmonary Rehab from 04/03/2021 in [facility] Cardiac and Pulmonary Rehab  Referring Provider Sparks       Initial Encounter Date:  Flowsheet Row Pulmonary Rehab from 04/03/2021 in [facility] Cardiac and Pulmonary Rehab  Date 04/03/21       Visit Diagnosis: Chronic obstructive pulmonary disease, unspecified COPD type ([place])  Patient's Home Medications on Admission:  Current Outpatient Medications:    amiodarone (PACERONE) 200 MG tablet, Take 1 tablet by mouth 2 (two) times daily at 8 am and 10 pm., Disp: , Rfl:    gabapentin (NEURONTIN) 300 MG capsule, TAKE 1 CAPSULE BY MOUTH THREE TIMES A DAY (Patient not taking: Reported on 07/26/2021), Disp: , Rfl:    glimepiride (AMARYL) 2 MG tablet, Take 4 mg by mouth daily with breakfast., Disp: , Rfl:    LANTUS 100 UNIT/ML injection, Inject 15 Units into the skin at bedtime., Disp: , Rfl: 0   loratadine (CLARITIN) 10 MG tablet, Take by mouth., Disp: , Rfl:    lovastatin (MEVACOR) 40 MG tablet, Take 40 mg by mouth daily. AM, Disp: , Rfl: 0   magnesium oxide (MAG-OX) 400 MG tablet, Take 400 mg by mouth daily. AM, Disp: , Rfl:    meloxicam (MOBIC) 15 MG tablet, Take 1 tablet by mouth daily., Disp: , Rfl:    metFORMIN (GLUCOPHAGE) 1000 MG tablet, Take 1,000 mg by mouth 2 (two) times daily. AM, Disp: , Rfl: 1   metoprolol succinate (TOPROL-XL) 50 MG 24 hr tablet, Take 100 mg by mouth daily., Disp: , Rfl:    omeprazole (PRILOSEC) 20 MG capsule, Take 20 mg by mouth daily. HS, Disp: , Rfl: 4   PRADAXA 150 MG CAPS capsule, Take 150 mg by mouth 2 (two) times daily. AM AND PM, Disp: , Rfl:    PROAIR HFA 108 (90 BASE) MCG/ACT inhaler, Inhale 2 puffs into the lungs every 6 (six) hours as needed. For shortness of breath and/or wheezing./ AM AND PM, Disp: , Rfl:    QUEtiapine (SEROQUEL) 25 MG tablet, Take by mouth.,  Disp: , Rfl:    Semaglutide-Weight Management 0.5 MG/0.5ML SOAJ, Inject into the skin., Disp: , Rfl:    TRELEGY ELLIPTA 100-62.5-25 MCG/ACT AEPB, Inhale into the lungs., Disp: , Rfl:   Past Medical History: Past Medical History:  Diagnosis Date   Blepharoptosis    BILATERAL   Chronic kidney disease    KIDNEY STONES   COPD (chronic obstructive pulmonary disease) ([place])    Coronary artery disease    Diabetes mellitus without complication (HCC)    Dysrhythmia    A-FIB/DR KOWALSKI   Hypercholesteremia    Hypertension    CONTROLLED ON MEDS   Pneumonia    IN PAST   Reiter's syndrome    Shortness of breath dyspnea    WITH EXERTION   Sleep apnea    C-PAP   Wears dentures    UPPERS    Tobacco Use: Social History   Tobacco Use  Smoking Status Former   Packs/day: 2.00   Years: 20.00   Total pack years: 40.00   Types: Cigarettes   Quit date: 03/31/2010   Years since quitting: 11.3  Smokeless Tobacco Former    Labs: Review Flowsheet        No data to display           Pulmonary Assessment Scores:  Pulmonary Assessment  Scores     Row Name 04/03/21 1134 07/25/21 1041       ADL UCSD   ADL Phase -- Exit    SOB Score total 58 60    Rest 3 0    Walk 3 1    Stairs 4 4    Bath 2 3    Dress 1 2    Shop 2 2      CAT Score   CAT Score 25 25      mMRC Score   mMRC Score 1 --             UCSD: Self-administered rating of dyspnea associated with activities of daily living (ADLs) 6-point scale (0 = "not at all" to 5 = "maximal or unable to do because of breathlessness")  Scoring Scores range from 0 to 120.  Minimally important difference is 5 units  CAT: CAT can identify the health impairment of COPD patients and is better correlated with disease progression.  CAT has a scoring range of zero to 40. The CAT score is classified into four groups of low (less than 10), medium (10 - 20), high (21-30) and very high (31-40) based on the impact level of disease on  health status. A CAT score over 10 suggests significant symptoms.  A worsening CAT score could be explained by an exacerbation, poor medication adherence, poor inhaler technique, or progression of COPD or comorbid conditions.  CAT MCID is 2 points  mMRC: mMRC (Modified Medical Research Council) Dyspnea Scale is used to assess the degree of baseline functional disability in patients of respiratory disease due to dyspnea. No minimal important difference is established. A decrease in score of 1 point or greater is considered a positive change.   Pulmonary Function Assessment:  Pulmonary Function Assessment - 04/03/21 1134       Pulmonary Function Tests   FVC% 75 %    FEV1% 59 %    FEV1/FVC Ratio 61.41             Exercise Target Goals: Exercise Program Goal: Individual exercise prescription set using results from initial 6 min walk test and THRR while considering  patient's activity barriers and safety.   Exercise Prescription Goal: Initial exercise prescription builds to 30-45 minutes a day of aerobic activity, 2-3 days per week.  Home exercise guidelines will be given to patient during program as part of exercise prescription that the participant will acknowledge.  Education: Aerobic Exercise: - Group verbal and visual presentation on the components of exercise prescription. Introduces F.I.T.T principle from ACSM for exercise prescriptions.  Reviews F.I.T.T. principles of aerobic exercise including progression. Written material given at graduation. Flowsheet Row Pulmonary Rehab from 06/14/2021 in [facility] Cardiac and Pulmonary Rehab  Date 05/31/21  Educator [facility]  Instruction Review Code 1- Verbalizes Understanding       Education: Resistance Exercise: - Group verbal and visual presentation on the components of exercise prescription. Introduces F.I.T.T principle from ACSM for exercise prescriptions  Reviews F.I.T.T. principles of resistance exercise including progression. Written  material given at graduation. Flowsheet Row Pulmonary Rehab from 06/14/2021 in [facility] Cardiac and Pulmonary Rehab  Date 04/06/21  Educator AS  Instruction Review Code 1- Verbalizes Understanding        Education: Exercise & Equipment Safety: - Individual verbal instruction and demonstration of equipment use and safety with use of the equipment. Flowsheet Row Pulmonary Rehab from 06/14/2021 in [facility] Cardiac and Pulmonary Rehab  Date 04/03/21  Educator AS  Instruction Review  Code 1- Verbalizes Understanding       Education: Exercise Physiology & General Exercise Guidelines: - Group verbal and written instruction with models to review the exercise physiology of the cardiovascular system and associated critical values. Provides general exercise guidelines with specific guidelines to those with heart or lung disease.    Education: Flexibility, Balance, Mind/Body Relaxation: - Group verbal and visual presentation with interactive activity on the components of exercise prescription. Introduces F.I.T.T principle from ACSM for exercise prescriptions. Reviews F.I.T.T. principles of flexibility and balance exercise training including progression. Also discusses the mind body connection.  Reviews various relaxation techniques to help reduce and manage stress (i.e. Deep breathing, progressive muscle relaxation, and visualization). Balance handout provided to take home. Written material given at graduation. Flowsheet Row Pulmonary Rehab from 06/14/2021 in [facility] Cardiac and Pulmonary Rehab  Date 06/14/21  Educator [facility]  Instruction Review Code 1- Verbalizes Understanding       Activity Barriers & Risk Stratification:  Activity Barriers & Cardiac Risk Stratification - 03/20/21 1124       Activity Barriers & Cardiac Risk Stratification   Activity Barriers Shortness of Breath;Deconditioning;Muscular Weakness             6 Minute Walk:  6 Minute Walk     Row Name 04/03/21 1117         6  Minute Walk   Phase Initial     Distance 1450 feet     Walk Time 6 minutes     # of Rest Breaks 0     MPH 2.75     METS 2.65     RPE 13     Perceived Dyspnea  3     VO2 Peak 9.27     Symptoms Yes (comment)     Comments SOB     Resting HR 68 bpm     Resting BP 138/60     Resting Oxygen Saturation  94 %     Exercise Oxygen Saturation  during 6 min walk 88 %     Max Ex. HR 103 bpm     Max Ex. BP 154/68     2 Minute Post BP 132/62       Interval HR   1 Minute HR 84     2 Minute HR 98     3 Minute HR 100     4 Minute HR 98     5 Minute HR 103     6 Minute HR 101     2 Minute Post HR 82     Interval Heart Rate? Yes       Interval Oxygen   Interval Oxygen? Yes     Baseline Oxygen Saturation % 94 %     1 Minute Oxygen Saturation % 91 %     1 Minute Liters of Oxygen 0 L     2 Minute Oxygen Saturation % 92 %     2 Minute Liters of Oxygen 0 L     3 Minute Oxygen Saturation % 89 %     3 Minute Liters of Oxygen 0 L     4 Minute Oxygen Saturation % 88 %     4 Minute Liters of Oxygen 0 L     5 Minute Oxygen Saturation % 88 %     5 Minute Liters of Oxygen 0 L     6 Minute Oxygen Saturation % 88 %     6 Minute Liters of Oxygen 0 L  2 Minute Post Oxygen Saturation % 92 %     2 Minute Post Liters of Oxygen 0 L             Oxygen Initial Assessment:  Oxygen Initial Assessment - 03/20/21 1133       Home Oxygen   Home Oxygen Device None    Sleep Oxygen Prescription None    Home Exercise Oxygen Prescription None    Home Resting Oxygen Prescription None      Initial 6 min Walk   Oxygen Used None      Program Oxygen Prescription   Program Oxygen Prescription None      Intervention   Short Term Goals To learn and understand importance of monitoring SPO2 with pulse oximeter and demonstrate accurate use of the pulse oximeter.;To learn and understand importance of maintaining oxygen saturations>88%;To learn and demonstrate proper pursed lip breathing techniques or other  breathing techniques. ;To learn and demonstrate proper use of respiratory medications    Long  Term Goals Verbalizes importance of monitoring SPO2 with pulse oximeter and return demonstration;Maintenance of O2 saturations>88%;Exhibits proper breathing techniques, such as pursed lip breathing or other method taught during program session;Compliance with respiratory medication;Demonstrates proper use of MDI's             Oxygen Re-Evaluation:  Oxygen Re-Evaluation     Row Name 04/04/21 0916 06/06/21 1008 06/20/21 0937 07/25/21 0955       Program Oxygen Prescription   Program Oxygen Prescription None None None None      Home Oxygen   Home Oxygen Device None None None None    Sleep Oxygen Prescription None None None None    Home Exercise Oxygen Prescription None None None None    Home Resting Oxygen Prescription None None None None      Goals/Expected Outcomes   Short Term Goals To learn and understand importance of monitoring SPO2 with pulse oximeter and demonstrate accurate use of the pulse oximeter.;To learn and understand importance of maintaining oxygen saturations>88%;To learn and demonstrate proper pursed lip breathing techniques or other breathing techniques.  To learn and understand importance of monitoring SPO2 with pulse oximeter and demonstrate accurate use of the pulse oximeter.;To learn and understand importance of maintaining oxygen saturations>88%;To learn and demonstrate proper pursed lip breathing techniques or other breathing techniques.  To learn and understand importance of monitoring SPO2 with pulse oximeter and demonstrate accurate use of the pulse oximeter.;To learn and understand importance of maintaining oxygen saturations>88%;To learn and demonstrate proper pursed lip breathing techniques or other breathing techniques.  To learn and understand importance of monitoring SPO2 with pulse oximeter and demonstrate accurate use of the pulse oximeter.;To learn and understand  importance of maintaining oxygen saturations>88%;To learn and demonstrate proper pursed lip breathing techniques or other breathing techniques.     Long  Term Goals Verbalizes importance of monitoring SPO2 with pulse oximeter and return demonstration;Maintenance of O2 saturations>88%;Exhibits proper breathing techniques, such as pursed lip breathing or other method taught during program session;Compliance with respiratory medication Verbalizes importance of monitoring SPO2 with pulse oximeter and return demonstration;Maintenance of O2 saturations>88%;Exhibits proper breathing techniques, such as pursed lip breathing or other method taught during program session;Compliance with respiratory medication Verbalizes importance of monitoring SPO2 with pulse oximeter and return demonstration;Maintenance of O2 saturations>88%;Exhibits proper breathing techniques, such as pursed lip breathing or other method taught during program session;Compliance with respiratory medication Verbalizes importance of monitoring SPO2 with pulse oximeter and return demonstration;Maintenance of O2 saturations>88%;Exhibits proper breathing techniques,  such as pursed lip breathing or other method taught during program session;Compliance with respiratory medication    Comments Reviewed PLB technique with pt.  Talked about how it works and it's importance in maintaining their exercise saturations. Reviewed PLB technique with pt.  Talked about how it works and it's importance in maintaining their exercise saturations. Short; Continue to use PLB to help with breathing Long: Continue to improve breathing Zachary Guerrero was doing better with his breathing until he went out of rhythm.  The prednisone last week really helped.  He is good about using his PLB and using his inhalers.    Goals/Expected Outcomes Short: Become more profiecient at using PLB.   Long: Become independent at using PLB. Short: Become more profiecient at using PLB.   Long: Become  independent at using PLB. -- Short: Continue to improve PLB use Long: Continue to work on breathing.             Oxygen Discharge (Final Oxygen Re-Evaluation):  Oxygen Re-Evaluation - 07/25/21 0955       Program Oxygen Prescription   Program Oxygen Prescription None      Home Oxygen   Home Oxygen Device None    Sleep Oxygen Prescription None    Home Exercise Oxygen Prescription None    Home Resting Oxygen Prescription None      Goals/Expected Outcomes   Short Term Goals To learn and understand importance of monitoring SPO2 with pulse oximeter and demonstrate accurate use of the pulse oximeter.;To learn and understand importance of maintaining oxygen saturations>88%;To learn and demonstrate proper pursed lip breathing techniques or other breathing techniques.     Long  Term Goals Verbalizes importance of monitoring SPO2 with pulse oximeter and return demonstration;Maintenance of O2 saturations>88%;Exhibits proper breathing techniques, such as pursed lip breathing or other method taught during program session;Compliance with respiratory medication    Comments Zachary Guerrero was doing better with his breathing until he went out of rhythm.  The prednisone last week really helped.  He is good about using his PLB and using his inhalers.    Goals/Expected Outcomes Short: Continue to improve PLB use Long: Continue to work on breathing.             Initial Exercise Prescription:  Initial Exercise Prescription - 04/03/21 1100       Date of Initial Exercise RX and Referring Provider   Date 04/03/21    Referring Provider Sparks      Oxygen   Maintain Oxygen Saturation 88% or higher      Treadmill   MPH 2    Grade 0.5    Minutes 15    METs 2.65      Recumbant Bike   Level 1    RPM 60    Watts 25    Minutes 15    METs 2.65      NuStep   Level 2    SPM 80    Minutes 15    METs 2.65      Elliptical   Level 1    Speed 2.5    Minutes 15    METs 2.65      T5 Nustep   Level 2     SPM 80    Minutes 15    METs 2.65      Prescription Details   Frequency (times per week) 3    Duration Progress to 30 minutes of continuous aerobic without signs/symptoms of physical distress      Intensity  THRR 40-80% of Max Heartrate 99-131    Ratings of Perceived Exertion 11-15    Perceived Dyspnea 0-4      Resistance Training   Training Prescription Yes    Weight 3 lb    Reps 10-15             Perform Capillary Blood Glucose checks as needed.  Exercise Prescription Changes:   Exercise Prescription Changes     Row Name 04/03/21 1100 04/18/21 1200 05/31/21 1400 06/06/21 0900 06/06/21 1000     Response to Exercise   Blood Pressure (Admit) 138/60 132/80 102/62 -- --   Blood Pressure (Exercise) 154/68 138/82 140/64 -- --   Blood Pressure (Exit) 132/62 120/62 108/60 -- --   Heart Rate (Admit) 68 bpm 67 bpm 64 bpm -- --   Heart Rate (Exercise) 103 bpm 98 bpm 76 bpm -- --   Heart Rate (Exit) 82 bpm 76 bpm 65 bpm -- --   Oxygen Saturation (Admit) 94 % 97 % 93 % -- --   Oxygen Saturation (Exercise) 88 % 90 % 90 % -- --   Oxygen Saturation (Exit) 92 % 93 % 92 % -- --   Rating of Perceived Exertion (Exercise) 13 13 13  -- --   Perceived Dyspnea (Exercise) 3 3 2  -- --   Symptoms SOB SOB SOB SOB SOB   Comments -- -- return from eye surgery return from eye surgery return from eye surgery   Duration -- Progress to 30 minutes of  aerobic without signs/symptoms of physical distress Progress to 30 minutes of  aerobic without signs/symptoms of physical distress Progress to 30 minutes of  aerobic without signs/symptoms of physical distress Progress to 30 minutes of  aerobic without signs/symptoms of physical distress   Intensity -- -- THRR unchanged THRR unchanged THRR unchanged     Progression   Progression -- -- Continue to progress workloads to maintain intensity without signs/symptoms of physical distress. Continue to progress workloads to maintain intensity without  signs/symptoms of physical distress. Continue to progress workloads to maintain intensity without signs/symptoms of physical distress.   Average METs -- -- 2.82 2.82 2.82     Resistance Training   Training Prescription -- Yes Yes Yes Yes   Weight -- 5 lb 5 lb 5 lb 5 lb   Reps -- 10-15 10-15 10-15 10-15     Interval Training   Interval Training -- -- No No No     Treadmill   MPH -- 2 2 2 2    Grade -- 0 0.5 0.5 0.5   Minutes -- 15 15 15 15    METs -- 2.53 2.67 2.67 2.67     NuStep   Level -- -- 3 3 3    Minutes -- -- 15 15 15    METs -- -- 3.2 3.2 3.2     T5 Nustep   Level -- 3 3 3 3    SPM -- 80 -- -- --   Minutes -- 15 15 15 15    METs -- 1.7 2.6 2.6 2.6     Home Exercise Plan   Plans to continue exercise at -- -- -- Home (comment)  walk Home (comment)  walk   Frequency -- -- -- Add 2 additional days to program exercise sessions. Add 2 additional days to program exercise sessions.   Initial Home Exercises Provided -- -- -- 06/06/21 06/06/21     Oxygen   Maintain Oxygen Saturation -- -- 88% or higher 88% or higher 88% or  higher    Row Name 06/12/21 1200 06/28/21 0900 07/12/21 1100 07/26/21 0700       Response to Exercise   Blood Pressure (Admit) 112/62 120/70 104/72 130/70    Blood Pressure (Exercise) 128/64 -- -- --    Blood Pressure (Exit) 102/60 124/64 126/68 124/70    Heart Rate (Admit) 75 bpm 70 bpm 63 bpm 74 bpm    Heart Rate (Exercise) 112 bpm 93 bpm 93 bpm 97 bpm    Heart Rate (Exit) 75 bpm 77 bpm 64 bpm 78 bpm    Oxygen Saturation (Admit) 93 % 92 % 93 % 94 %    Oxygen Saturation (Exercise) 91 % 89 % 89 % 88 %    Oxygen Saturation (Exit) 94 % 93 % 93 % 92 %    Rating of Perceived Exertion (Exercise) 14 14 15 15     Perceived Dyspnea (Exercise) 2 3 3 3     Symptoms SOB SOB SOB SOB    Duration Continue with 30 min of aerobic exercise without signs/symptoms of physical distress. Continue with 30 min of aerobic exercise without signs/symptoms of physical distress.  Continue with 30 min of aerobic exercise without signs/symptoms of physical distress. Continue with 30 min of aerobic exercise without signs/symptoms of physical distress.    Intensity -- THRR unchanged THRR unchanged THRR unchanged      Progression   Progression Continue to progress workloads to maintain intensity without signs/symptoms of physical distress. Continue to progress workloads to maintain intensity without signs/symptoms of physical distress. Continue to progress workloads to maintain intensity without signs/symptoms of physical distress. Continue to progress workloads to maintain intensity without signs/symptoms of physical distress.    Average METs 2.56 2.7 2.68 2.79      Resistance Training   Training Prescription Yes Yes Yes Yes    Weight 6 lb 6 lb 6 lb 7 lb    Reps 10-15 10-15 10-15 10-15      Interval Training   Interval Training No No No No      Treadmill   MPH 2 2 2  --    Grade 0.5 0.5 0 --    Minutes 15 15 15  --    METs 2.67 2.67 2.63 --      NuStep   Level 5 4 5 5     Minutes 15 15 15 15     METs 3.9 3.6 3.7 3.7      T5 Nustep   Level 2 3 4  --    Minutes 15 15 15  --    METs 2.3 2.3 2.1 --      Track   Laps -- 14 30 30     Minutes -- 15 15 15     METs -- 1.76 2.63 2.63      Home Exercise Plan   Plans to continue exercise at Home (comment)  walk Home (comment)  walk Home (comment)  walk Home (comment)  walk    Frequency Add 2 additional days to program exercise sessions. Add 2 additional days to program exercise sessions. Add 2 additional days to program exercise sessions. Add 2 additional days to program exercise sessions.    Initial Home Exercises Provided 06/06/21 06/06/21 06/06/21 06/06/21      Oxygen   Maintain Oxygen Saturation 88% or higher 88% or higher 88% or higher 88% or higher             Exercise Comments:   Exercise Comments     Row Name 04/04/21 [phone]  Exercise Comments First full day of exercise!  Patient was oriented  to gym and equipment including functions, settings, policies, and procedures.  Patient's individual exercise prescription and treatment plan were reviewed.  All starting workloads were established based on the results of the 6 minute walk test done at initial orientation visit.  The plan for exercise progression was also introduced and progression will be customized based on patient's performance and goals.                Exercise Goals and Review:   Exercise Goals     Row Name 04/03/21 1130             Exercise Goals   Increase Physical Activity Yes       Intervention Provide advice, education, support and counseling about physical activity/exercise needs.;Develop an individualized exercise prescription for aerobic and resistive training based on initial evaluation findings, risk stratification, comorbidities and participant's personal goals.       Expected Outcomes Short Term: Attend rehab on a regular basis to increase amount of physical activity.;Long Term: Add in home exercise to make exercise part of routine and to increase amount of physical activity.;Long Term: Exercising regularly at least 3-5 days a week.       Increase Strength and Stamina Yes       Intervention Provide advice, education, support and counseling about physical activity/exercise needs.;Develop an individualized exercise prescription for aerobic and resistive training based on initial evaluation findings, risk stratification, comorbidities and participant's personal goals.       Expected Outcomes Short Term: Increase workloads from initial exercise prescription for resistance, speed, and METs.;Short Term: Perform resistance training exercises routinely during rehab and add in resistance training at home;Long Term: Improve cardiorespiratory fitness, muscular endurance and strength as measured by increased METs and functional capacity (6MWT)       Able to understand and use rate of perceived exertion (RPE) scale Yes        Intervention Provide education and explanation on how to use RPE scale       Expected Outcomes Short Term: Able to use RPE daily in rehab to express subjective intensity level;Long Term:  Able to use RPE to guide intensity level when exercising independently       Able to understand and use Dyspnea scale Yes       Intervention Provide education and explanation on how to use Dyspnea scale       Expected Outcomes Short Term: Able to use Dyspnea scale daily in rehab to express subjective sense of shortness of breath during exertion;Long Term: Able to use Dyspnea scale to guide intensity level when exercising independently       Knowledge and understanding of Target Heart Rate Range (THRR) Yes       Intervention Provide education and explanation of THRR including how the numbers were predicted and where they are located for reference       Expected Outcomes Short Term: Able to state/look up THRR;Short Term: Able to use daily as guideline for intensity in rehab;Long Term: Able to use THRR to govern intensity when exercising independently       Able to check pulse independently Yes       Intervention Provide education and demonstration on how to check pulse in carotid and radial arteries.;Review the importance of being able to check your own pulse for safety during independent exercise       Expected Outcomes Short Term: Able to explain why pulse checking is  important during independent exercise;Long Term: Able to check pulse independently and accurately       Understanding of Exercise Prescription Yes       Intervention Provide education, explanation, and written materials on patient's individual exercise prescription       Expected Outcomes Short Term: Able to explain program exercise prescription;Long Term: Able to explain home exercise prescription to exercise independently                Exercise Goals Re-Evaluation :  Exercise Goals Re-Evaluation     Row Name 04/04/21 0915 04/18/21 1255  05/01/21 1456 05/31/21 1401 06/06/21 1009     Exercise Goal Re-Evaluation   Exercise Goals Review Increase Physical Activity;Able to understand and use rate of perceived exertion (RPE) scale;Knowledge and understanding of Target Heart Rate Range (THRR);Understanding of Exercise Prescription;Increase Strength and Stamina;Able to understand and use Dyspnea scale;Able to check pulse independently Increase Physical Activity;Increase Strength and Stamina -- Increase Physical Activity;Increase Strength and Stamina;Understanding of Exercise Prescription Increase Physical Activity;Able to understand and use rate of perceived exertion (RPE) scale;Knowledge and understanding of Target Heart Rate Range (THRR);Understanding of Exercise Prescription;Increase Strength and Stamina;Able to understand and use Dyspnea scale;Able to check pulse independently   Comments Reviewed RPE and dyspnea scales, THR and program prescription with pt today.  Pt voiced understanding and was given a copy of goals to take home. Zachary Guerrero has tolerated exercise well so far.  He is up to level 3 on T5. Staff will monitor progress. Out since last review.  Last attended on 04/17/21. Rand returned to rehab this week after eye surgery.  He was able to come back and pick things up again.  We will conitnue to monitor his progress. Reviewed home exercise with pt today.  Pt plans to walk for exercise.  Reviewed THR, pulse, RPE, sign and symptoms, pulse oximetery and when to call 911 or MD.  Also discussed weather considerations and indoor options.  Pt voiced understanding.   Expected Outcomes Short: Use RPE daily to regulate intensity. Long: Follow program prescription in THR. Short:  attend consistently Long: improve overall stamina -- Short: Return to regular attendance Long: conitnue to follow program precription Short: add 1-2 days a week walking outside of class. Long: become independent with exercise program.    Row Name 06/12/21 1241 06/20/21 0927  06/28/21 0911 07/12/21 1148 07/25/21 0939     Exercise Goal Re-Evaluation   Exercise Goals Review Increase Physical Activity;Increase Strength and Stamina;Understanding of Exercise Prescription Increase Physical Activity;Increase Strength and Stamina;Understanding of Exercise Prescription Increase Physical Activity;Increase Strength and Stamina;Understanding of Exercise Prescription Increase Physical Activity;Increase Strength and Stamina;Understanding of Exercise Prescription Increase Physical Activity;Increase Strength and Stamina;Understanding of Exercise Prescription   Comments Zachary Guerrero continues to attend since he's been out for eye surgery. His level on the T4 Nustep increased to level 5 and hand weights increased to 6 lbs. He has not increased his workload on the TM and would benefit from doing so. Will continue to monitor. Zachary Guerrero is doing well in rehab. Over weekend, he was able to go to his beach properties to work on some up keep.  This keeps him active and he is also walking on his off days. He is feeling like his stamina is improving. Zachary Guerrero is doing well in rehab. He increased his overall MET level to 2.7 METs. He also tolerated 6 lb hand weights for resistance training. H has started to get closer to reaching his THR zone. We will continue to monitor  his progress in the program. Zachary Guerrero continues to do well in rehab.  He reached a max of 30 laps so far on the track! He also increased to level 4 on the T5 Nustep. RPEs are in appropriate range. Will continue to monitor. Zachary Guerrero is scheduled for a cardioversion tomorrow and will need clearance to return.  He asked to hold off on his post 6MWT til after his cardioversion to be able to feel better.   Expected Outcomes Short: Increase speed on treadmill Long: Continue to increase overall MET level Short; Continue to walk on off days Long;Continue to improve stamina Short; Increase number of laps on track. Long;Continue to increase overall MET levels. Short:  Continue to work up laps on track Long: Continue to build up overall strength and stamina Short: Clearance to return to rehab and improve post 6MWT Long: Continue to build stamina    Row Name 07/26/21 0745             Exercise Goal Re-Evaluation   Exercise Goals Review Increase Physical Activity;Increase Strength and Stamina;Understanding of Exercise Prescription       Comments Zachary Guerrero continues to do well in rehab. He recently increased to 7 lb hand weights for resistance exercise. He also increased his average overall MET level to 2.79 METs. Zachary Guerrero is also close to graduating and is scheduled to complete his post 6MWT once he has his cardioversion and is cleared to return to rehab. We will continue to monitor his progress until he graduates.       Expected Outcomes Short: Clearance to return to rehab and improve post 6MWT Long: Continue to build stamina                Discharge Exercise Prescription (Final Exercise Prescription Changes):  Exercise Prescription Changes - 07/26/21 0700       Response to Exercise   Blood Pressure (Admit) 130/70    Blood Pressure (Exit) 124/70    Heart Rate (Admit) 74 bpm    Heart Rate (Exercise) 97 bpm    Heart Rate (Exit) 78 bpm    Oxygen Saturation (Admit) 94 %    Oxygen Saturation (Exercise) 88 %    Oxygen Saturation (Exit) 92 %    Rating of Perceived Exertion (Exercise) 15    Perceived Dyspnea (Exercise) 3    Symptoms SOB    Duration Continue with 30 min of aerobic exercise without signs/symptoms of physical distress.    Intensity THRR unchanged      Progression   Progression Continue to progress workloads to maintain intensity without signs/symptoms of physical distress.    Average METs 2.79      Resistance Training   Training Prescription Yes    Weight 7 lb    Reps 10-15      Interval Training   Interval Training No      NuStep   Level 5    Minutes 15    METs 3.7      Track   Laps 30    Minutes 15    METs 2.63      Home  Exercise Plan   Plans to continue exercise at Home (comment)   walk   Frequency Add 2 additional days to program exercise sessions.    Initial Home Exercises Provided 06/06/21      Oxygen   Maintain Oxygen Saturation 88% or higher             Nutrition:  Target Goals: Understanding of nutrition guidelines, daily  intake of sodium '1500mg'$ , cholesterol '200mg'$ , calories 30% from fat and 7% or less from saturated fats, daily to have 5 or more servings of fruits and vegetables.  Education: All About Nutrition: -Group instruction provided by verbal, written material, interactive activities, discussions, models, and posters to present general guidelines for heart healthy nutrition including fat, fiber, MyPlate, the role of sodium in heart healthy nutrition, utilization of the nutrition label, and utilization of this knowledge for meal planning. Follow up email sent as well. Written material given at graduation. Flowsheet Row Pulmonary Rehab from 06/14/2021 in [facility] Cardiac and Pulmonary Rehab  Date 04/13/21  Educator [facility]  Instruction Review Code 1- Verbalizes Understanding       Biometrics:  Pre Biometrics - 04/03/21 1131       Pre Biometrics   Height 5' 11.25" (1.81 m)    Weight 266 lb 11.2 oz (121 kg)    BMI (Calculated) 36.93    Single Leg Stand 4.3 seconds              Nutrition Therapy Plan and Nutrition Goals:  Nutrition Therapy & Goals - 06/20/21 1015       Nutrition Therapy   Diet Heart healthy, low Na, T2DM MNT    Protein (specify units) 90-95g    Fiber 30 grams    Whole Grain Foods 3 servings    Saturated Fats 16 max. grams    Fruits and Vegetables 8 servings/day    Sodium 2 grams      Personal Nutrition Goals   Nutrition Goal ST: add nuts/seeds to cereal, plan meals using MyPlate guidelines, add in satitaing snacks that include fat, fiber, and protein like homemade tzatziki with vegetables and whole wheat pita chips or cottage cheese with fruit.  LT: prevent  BG lows, follow MyPlate guidelines, honor hunger.    Comments 76 y.o. M admitted to pulmonary rehab for COPD. PMHx includes a fib., CAD, HLD, nephrolithiasis, reiter's disease, T2DM, barrett's esophagus. PSHx includes cholecystectomy. Relevant medications includes lantus, metformin, mag-ox, omeprazole, Semaglutide-Weight Management, quetiapine, glimepiride. PYP Score: 57. Vegetables & Fruits 6/12. Breads, Grains & Cereals 7/12. Red & Processed Meat 6/12. Poultry 2/2. Fish & Shellfish 4/4. Beans, Nuts & Seeds 3/4. Milk & Dairy Foods 6/6. Toppings, Oils, Seasonings & Salt 6/20. Sweets, Snacks & Restaurant Food 9/14. Beverages 8/10. Drinks: Coffee with some cream and water. B: cereal (oatmeal and corn flakes with banana) or hard boiled eggs. He reports getting bored. L: salads (tomatoes, cucumber, onion, olives, boiled egg, radishes) and sandwiches - peanut butter and banana ( whole wheat bread), soup, leftovers, cheeseburger 1x/week, out to eat 1x/week. D: uses olive oil, pinto beans, corn, peaches, cottage cheese, hamburger, chicken mostly, will sometimes go out. He has weight cycled previously and most recently was trying noom to help with weight loss. Encouraged that heart healthy habits with help with his health outcomes even outside of weight loss. Discussed honoring hunger and limiting excess calories in the form of fats like oil and added sugar as these can add calories without necessarily adding satiation. Discussed heart healthy eating and MyPlate guidelines as well as T2DM MNT. Zachary Guerrero reports having low BG semi-frequesntly and usually when he will skip a meal - discussed the importance of eating consistent CHO during the day and speaking with his MD regarding these lows as he is on Lantus. Discussed increase energy and protein needs with COPD.      Intervention Plan   Intervention Prescribe, educate and counsel regarding individualized specific dietary  modifications aiming towards targeted core components  such as weight, hypertension, lipid management, diabetes, heart failure and other comorbidities.;Nutrition handout(s) given to patient.    Expected Outcomes Short Term Goal: A plan has been developed with personal nutrition goals set during dietitian appointment.;Short Term Goal: Understand basic principles of dietary content, such as calories, fat, sodium, cholesterol and nutrients.;Long Term Goal: Adherence to prescribed nutrition plan.             Nutrition Assessments:  MEDIFICTS Score Key: ?70 Need to make dietary changes  40-70 Heart Healthy Diet ? 40 Therapeutic Level Cholesterol Diet  Flowsheet Row Pulmonary Rehab from [facility]/25/2021 in [facility] Cardiac and Pulmonary Rehab  Picture Your Plate Total Score on Discharge 51      Picture Your Plate Scores: <08 Unhealthy dietary pattern with much room for improvement. 41-50 Dietary pattern unlikely to meet recommendations for good health and room for improvement. 51-60 More healthful dietary pattern, with some room for improvement.  >60 Healthy dietary pattern, although there may be some specific behaviors that could be improved.   Nutrition Goals Re-Evaluation:  Nutrition Goals Re-Evaluation     [place] Name 06/06/21 0958 06/20/21 0930 07/25/21 0954         Goals   Nutrition Goal -- Heart healthy eating plan Short: Try some new breakfast options and add nuts for protein Long: continue to balance meals with carbs/fats/proteins     Comment Patient has appointment scheduled with dietician on 06/13/2021 Zachary Guerrero missed his appointment and is struggling with his diet.  He is trying to balance out his sugars to help him keeping from getting too low or too high.  We talked about balancing carbs with proteins and fats to avoid the swings. We talked about breakfast options in particular. Zachary Guerrero is doing well in rehab.  He is eating a lot of good salads with the better selection of produce available.  He is also adding in more protein bolus to his cereal  each day.  His sugars have been more balanced with the eatra protein.  He is feeling good about his diet plan currently.     Expected Outcome -- Short: Try some new breakfast options and add nuts for protein Long: continue to balance meals with carbs/fats/proteins SHort: Continue to add in protein at breakfast.  Long; Continue to balance meals.              Nutrition Goals Discharge (Final Nutrition Goals Re-Evaluation):  Nutrition Goals Re-Evaluation - 07/25/21 0954       Goals   Nutrition Goal Short: Try some new breakfast options and add nuts for protein Long: continue to balance meals with carbs/fats/proteins    Comment Zachary Guerrero is doing well in rehab.  He is eating a lot of good salads with the better selection of produce available.  He is also adding in more protein bolus to his cereal each day.  His sugars have been more balanced with the eatra protein.  He is feeling good about his diet plan currently.    Expected Outcome SHort: Continue to add in protein at breakfast.  Long; Continue to balance meals.             Psychosocial: Target Goals: Acknowledge presence or absence of significant depression and/or stress, maximize coping skills, provide positive support system. Participant is able to verbalize types and ability to use techniques and skills needed for reducing stress and depression.   Education: Stress, Anxiety, and Depression - Group verbal and visual presentation to  define topics covered.  Reviews how body is impacted by stress, anxiety, and depression.  Also discusses healthy ways to reduce stress and to treat/manage anxiety and depression.  Written material given at graduation.   Education: Sleep Hygiene -Provides group verbal and written instruction about how sleep can affect your health.  Define sleep hygiene, discuss sleep cycles and impact of sleep habits. Review good sleep hygiene tips.    Initial Review & Psychosocial Screening:  Initial Psych Review &  Screening - 03/20/21 1125       Initial Review   Current issues with Current Stress Concerns    Source of Stress Concerns Chronic Illness;Unable to participate in former interests or hobbies;Unable to perform yard/household activities    Comments Worries about getting older and slowing down, frustrating at times      [place]? Yes   wife, children (live in [place], 2 kids, 3 grandchildren)   Comments wants to be able to keep up with grandkids      Barriers   Psychosocial barriers to participate in program The patient should benefit from training in stress management and relaxation.;Psychosocial barriers identified (see note)      Screening Interventions   Interventions Encouraged to exercise;To provide support and resources with identified psychosocial needs;Provide feedback about the scores to participant    Expected Outcomes Short Term goal: Utilizing psychosocial counselor, staff and physician to assist with identification of specific Stressors or current issues interfering with healing process. Setting desired goal for each stressor or current issue identified.;Long Term Goal: Stressors or current issues are controlled or eliminated.;Short Term goal: Identification and review with participant of any Quality of Life or Depression concerns found by scoring the questionnaire.;Long Term goal: The participant improves quality of Life and PHQ9 Scores as seen by post scores and/or verbalization of changes             Quality of Life Scores:  Scores of 19 and below usually indicate a poorer quality of life in these areas.  A difference of  2-3 points is a clinically meaningful difference.  A difference of 2-3 points in the total score of the Quality of Life Index has been associated with significant improvement in overall quality of life, self-image, physical symptoms, and general health in studies assessing change in quality of life.  PHQ-9: Review Flowsheet        07/25/2021 04/03/2021  Depression screen PHQ 2/9  Decreased Interest 1 0  Down, Depressed, Hopeless 1 0  PHQ - 2 Score 2 0  Altered sleeping 3 2  Tired, decreased energy 2 1  Change in appetite 2 1  Feeling bad or failure about yourself  2 1  Trouble concentrating 1 0  Moving slowly or fidgety/restless 1 -  Suicidal thoughts 0 0  PHQ-9 Score 13 5  Difficult doing work/chores Somewhat difficult Not difficult at all   Interpretation of Total Score  Total Score Depression Severity:  1-4 = Minimal depression, 5-9 = Mild depression, 10-14 = Moderate depression, 15-19 = Moderately severe depression, 20-27 = Severe depression   Psychosocial Evaluation and Intervention:  Psychosocial Evaluation - 03/20/21 1135       Psychosocial Evaluation & Interventions   Interventions Stress management education;Encouraged to exercise with the program and follow exercise prescription    Comments Zachary Guerrero is coming to pulmonoary rehab for his COPD.  He has a history of smoking but quit in 2012.  His wife and family are his biggest supporters.  He has two kids that live in [place] and 3 grandkids that he wants to be able to keep up with and being around for.  His biggest frustrations are not being to do what he needs to and wants to do.  He used to be able to hike for 3 miles with his wife each week, but no longer able to go that long.  He really wants to get his health better overall and have mroe energy.  He is also aiming to lose weight with a goal of 20 lbs.  He is also scheduled for cataract surgery in March and hoping that will help him be able to see better again.  He is very excited about the program and looking forward to working towards feeling better again.             Psychosocial Re-Evaluation:  Psychosocial Re-Evaluation     [place] Name 06/06/21 [phone] 06/20/21 0929 07/25/21 0951         Psychosocial Re-Evaluation   Current issues with Current Sleep Concerns Current Sleep  Concerns;Current Stress Concerns Current Sleep Concerns;Current Stress Concerns     Comments Patient reports no new stress mental health concerns to reports. Zachary Guerrero is doing well in rehab. 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  He was down at beach cleaning up some over the weekend.  He is sleep is getting better and he is feeling better overall. Zachary Guerrero is not feeling the best today as his heart is out of rhythm.  He is scheduled for a cardioversion tomorrow.  His job is still his biggest stressor from his properties.  He has had a rough stress week with lots to do.  He has a lot coming at him right now from all around him.  He was feeling good last week, but today feeling hit hard.  He is hoping the cardioversion will help him feel better.     Expected Outcomes Short: continue to attend pulmonary rehab for mental health benefits. Long: Manintain good mental health and sleep habits. Short: Conitnue to exercise for mental boost Long: Continue to stay positive. Short: Get cardioversion and use PLB to help with breathing Long: COnitnue to focus on positive.     Interventions Encouraged to attend Pulmonary Rehabilitation for the exercise Encouraged to attend Pulmonary Rehabilitation for the exercise Encouraged to attend Pulmonary Rehabilitation for the exercise     Continue Psychosocial Services  Follow up required by staff Follow up required by staff --              Psychosocial Discharge (Final Psychosocial Re-Evaluation):  Psychosocial Re-Evaluation - 07/25/21 0951       Psychosocial Re-Evaluation   Current issues with Current Sleep Concerns;Current Stress Concerns    Comments Zachary Guerrero is not feeling the best today as his heart is out of rhythm.  He is scheduled for a cardioversion tomorrow.  His job is still his biggest stressor from his properties.  He has had a rough stress week with lots to do.  He has a lot coming at him right now from all around him.  He  was feeling good last week, but today feeling hit hard.  He is hoping the cardioversion will help him feel better.    Expected Outcomes Short: Get cardioversion and use PLB to help with breathing Long: COnitnue to focus on positive.    Interventions Encouraged to attend Pulmonary Rehabilitation for the exercise  Education: Education Goals: Education classes will be provided on a weekly basis, covering required topics. Participant will state understanding/return demonstration of topics presented.  Learning Barriers/Preferences:  Learning Barriers/Preferences - 03/20/21 1124       Learning Barriers/Preferences   Learning Barriers Sight   needs cataract surgery (March)   Learning Preferences None             General Pulmonary Education Topics:  Infection Prevention: - Provides verbal and written material to individual with discussion of infection control including proper hand washing and proper equipment cleaning during exercise session. Flowsheet Row Pulmonary Rehab from 06/14/2021 in [facility] Cardiac and Pulmonary Rehab  Date 04/03/21  Educator AS  Instruction Review Code 1- Verbalizes Understanding       Falls Prevention: - Provides verbal and written material to individual with discussion of falls prevention and safety. Flowsheet Row Pulmonary Rehab from 06/14/2021 in [facility] Cardiac and Pulmonary Rehab  Date 04/03/21  Educator AS  Instruction Review Code 1- Verbalizes Understanding       Chronic Lung Disease Review: - Group verbal instruction with posters, models, PowerPoint presentations and videos,  to review new updates, new respiratory medications, new advancements in procedures and treatments. Providing information on websites and "800" numbers for continued self-education. Includes information about supplement oxygen, available portable oxygen systems, continuous and intermittent flow rates, oxygen safety, concentrators, and Medicare reimbursement for  oxygen. Explanation of Pulmonary Drugs, including class, frequency, complications, importance of spacers, rinsing mouth after steroid MDI's, and proper cleaning methods for nebulizers. Review of basic lung anatomy and physiology related to function, structure, and complications of lung disease. Review of risk factors. Discussion about methods for diagnosing sleep apnea and types of masks and machines for OSA. Includes a review of the use of types of environmental controls: home humidity, furnaces, filters, dust mite/pet prevention, HEPA vacuums. Discussion about weather changes, air quality and the benefits of nasal washing. Instruction on Warning signs, infection symptoms, calling MD promptly, preventive modes, and value of vaccinations. Review of effective airway clearance, coughing and/or vibration techniques. Emphasizing that all should Create an Action Plan. Written material given at graduation.   AED/CPR: - Group verbal and written instruction with the use of models to demonstrate the basic use of the AED with the basic ABC's of resuscitation.    Anatomy and Cardiac Procedures: - Group verbal and visual presentation and models provide information about basic cardiac anatomy and function. Reviews the testing methods done to diagnose heart disease and the outcomes of the test results. Describes the treatment choices: Medical Management, Angioplasty, or Coronary Bypass Surgery for treating various heart conditions including Myocardial Infarction, Angina, Valve Disease, and Cardiac Arrhythmias.  Written material given at graduation. Flowsheet Row Pulmonary Rehab from 06/14/2021 in [facility] Cardiac and Pulmonary Rehab  Date 04/06/21  Educator SB  Instruction Review Code 1- Verbalizes Understanding       Medication Safety: - Group verbal and visual instruction to review commonly prescribed medications for heart and lung disease. Reviews the medication, class of the drug, and side effects. Includes the  steps to properly store meds and maintain the prescription regimen.  Written material given at graduation.   Other: -Provides group and verbal instruction on various topics (see comments)   Knowledge Questionnaire Score:  Knowledge Questionnaire Score - 07/25/21 1040       Knowledge Questionnaire Score   Pre Score 16/18    Post Score 15/18  Core Components/Risk Factors/Patient Goals at Admission:  Personal Goals and Risk Factors at Admission - 04/03/21 1139       Core Components/Risk Factors/Patient Goals on Admission    Weight Management Yes;Obesity;Weight Loss    Intervention Weight Management: Develop a combined nutrition and exercise program designed to reach desired caloric intake, while maintaining appropriate intake of nutrient and fiber, sodium and fats, and appropriate energy expenditure required for the weight goal.;Weight Management: Provide education and appropriate resources to help participant work on and attain dietary goals.;Weight Management/Obesity: Establish reasonable short term and long term weight goals.;Obesity: Provide education and appropriate resources to help participant work on and attain dietary goals.    Admit Weight 266 lb 11.2 oz (121 kg)    Goal Weight: Short Term 255 lb (115.7 kg)    Goal Weight: Long Term 240 lb (108.9 kg)    Expected Outcomes Short Term: Continue to assess and modify interventions until short term weight is achieved;Long Term: Adherence to nutrition and physical activity/exercise program aimed toward attainment of established weight goal;Weight Loss: Understanding of general recommendations for a balanced deficit meal plan, which promotes 1-2 lb weight loss per week and includes a negative energy balance of [phone] kcal/d;Understanding recommendations for meals to include 15-35% energy as protein, 25-35% energy from fat, 35-60% energy from carbohydrates, less than $RemoveB'200mg'YmvjoKkV$  of dietary cholesterol, 20-35 gm of total fiber  daily;Understanding of distribution of calorie intake throughout the day with the consumption of 4-5 meals/snacks    Improve shortness of breath with ADL's Yes    Intervention Provide education, individualized exercise plan and daily activity instruction to help decrease symptoms of SOB with activities of daily living.    Expected Outcomes Short Term: Improve cardiorespiratory fitness to achieve a reduction of symptoms when performing ADLs;Long Term: Be able to perform more ADLs without symptoms or delay the onset of symptoms    Increase knowledge of respiratory medications and ability to use respiratory devices properly  Yes    Intervention Provide education and demonstration as needed of appropriate use of medications, inhalers, and oxygen therapy.    Expected Outcomes Short Term: Achieves understanding of medications use. Understands that oxygen is a medication prescribed by physician. Demonstrates appropriate use of inhaler and oxygen therapy.;Long Term: Maintain appropriate use of medications, inhalers, and oxygen therapy.    Diabetes Yes    Intervention Provide education about signs/symptoms and action to take for hypo/hyperglycemia.;Provide education about proper nutrition, including hydration, and aerobic/resistive exercise prescription along with prescribed medications to achieve blood glucose in normal ranges: Fasting glucose 65-99 mg/dL    Expected Outcomes Short Term: Participant verbalizes understanding of the signs/symptoms and immediate care of hyper/hypoglycemia, proper foot care and importance of medication, aerobic/resistive exercise and nutrition plan for blood glucose control.;Long Term: Attainment of HbA1C < 7%.    Hypertension Yes    Intervention Provide education on lifestyle modifcations including regular physical activity/exercise, weight management, moderate sodium restriction and increased consumption of fresh fruit, vegetables, and low fat dairy, alcohol moderation, and smoking  cessation.;Monitor prescription use compliance.    Expected Outcomes Short Term: Continued assessment and intervention until BP is < 140/18mm HG in hypertensive participants. < 130/105mm HG in hypertensive participants with diabetes, heart failure or chronic kidney disease.;Long Term: Maintenance of blood pressure at goal levels.    Lipids Yes    Intervention Provide education and support for participant on nutrition & aerobic/resistive exercise along with prescribed medications to achieve LDL '70mg'$ , HDL >$Remo'40mg'zZeDx$ .    Expected Outcomes  Short Term: Participant states understanding of desired cholesterol values and is compliant with medications prescribed. Participant is following exercise prescription and nutrition guidelines.;Long Term: Cholesterol controlled with medications as prescribed, with individualized exercise RX and with personalized nutrition plan. Value goals: LDL < $Rem'70mg'xTTd$ , HDL > 40 mg.             Education:Diabetes - Individual verbal and written instruction to review signs/symptoms of diabetes, desired ranges of glucose level fasting, after meals and with exercise. Acknowledge that pre and post exercise glucose checks will be done for 3 sessions at entry of program. Flowsheet Row Pulmonary Rehab from 03/20/2021 in [facility] Cardiac and Pulmonary Rehab  Date 03/20/21  Educator [facility]  Instruction Review Code 1- Verbalizes Understanding       Know Your Numbers and Heart Failure: - Group verbal and visual instruction to discuss disease risk factors for cardiac and pulmonary disease and treatment options.  Reviews associated critical values for Overweight/Obesity, Hypertension, Cholesterol, and Diabetes.  Discusses basics of heart failure: signs/symptoms and treatments.  Introduces Heart Failure Zone chart for action plan for heart failure.  Written material given at graduation.   Core Components/Risk Factors/Patient Goals Review:   Goals and Risk Factor Review     Row Name 06/06/21 0959  06/20/21 0935 07/25/21 0935         Core Components/Risk Factors/Patient Goals Review   Personal Goals Review Weight Management/Obesity;Increase knowledge of respiratory medications and ability to use respiratory devices properly.;Lipids;Diabetes;Improve shortness of breath with ADL's;Hypertension;Develop more efficient breathing techniques such as purse lipped breathing and diaphragmatic breathing and practicing self-pacing with activity. Weight Management/Obesity;Increase knowledge of respiratory medications and ability to use respiratory devices properly.;Lipids;Diabetes;Improve shortness of breath with ADL's;Hypertension;Develop more efficient breathing techniques such as purse lipped breathing and diaphragmatic breathing and practicing self-pacing with activity. Weight Management/Obesity;Increase knowledge of respiratory medications and ability to use respiratory devices properly.;Lipids;Diabetes;Improve shortness of breath with ADL's;Hypertension     Review Patient reports that his weight has been steady. He takes all meds as prescribed. Lipids, BP, and blood sugar are reported to be in acceptable ranges. He reports that he has seen some small improvements in SOB and is comfortable monitoring SAO2 levels at home and using PLB when SOB. Zachary Guerrero is having swings with his sugars and trying to find a balance.  We talked about some options to help balance meals.  He is disappointed that he is not losing weight with [place] for 5 months.  He was really hoping it would go down.  His breathing endurance is improving and he is able to do more at home.  His pressures have been good. He is doing well with resp meds. Zachary Guerrero is scheduled for a cardioversion tormorrow. He is not feeling the best today and hope getting back into rhythm will help.  His breathing  was getting better up until going out of rhythm.  He is good about using his meds and using his PLB to help.  His weight continues to go up but has not noticed  any swelling for retaining fluid.  His pressures are doing well. He has come off his predisone on Friday and hopes his sugars will balance back out from being high.     Expected Outcomes Short: continue to take all meds as prescribed and continue to montior wt, BG, BS at home. Long: Continue to montior risk factors. Short: COntinue to work on balance for sugars Long: Continue to work on [employer] loss Short: Get clearance to return after cardioversion  Long: Continue to monitor risk factors.              Core Components/Risk Factors/Patient Goals at Discharge (Final Review):   Goals and Risk Factor Review - 07/25/21 0935       Core Components/Risk Factors/Patient Goals Review   Personal Goals Review Weight Management/Obesity;Increase knowledge of respiratory medications and ability to use respiratory devices properly.;Lipids;Diabetes;Improve shortness of breath with ADL's;Hypertension    Review Zachary Guerrero is scheduled for a cardioversion tormorrow. He is not feeling the best today and hope getting back into rhythm will help.  His breathing  was getting better up until going out of rhythm.  He is good about using his meds and using his PLB to help.  His weight continues to go up but has not noticed any swelling for retaining fluid.  His pressures are doing well. He has come off his predisone on Friday and hopes his sugars will balance back out from being high.    Expected Outcomes Short: Get clearance to return after cardioversion Long: Continue to monitor risk factors.             ITP Comments:  ITP Comments     Row Name 03/20/21 1148 04/03/21 1138 04/04/21 0915 04/19/21 0853 05/01/21 1455   ITP Comments Completed virtual orientation today.  EP evaluation is scheduled for Monday 3/6 at 9am.  Documentation for diagnosis can be found in [facility] encounter 01/03/21. Completed 6MWT and gym orientation. Initial ITP created and sent for review to Dr. Ottie Glazier, Medical Director. First full day of  exercise!  Patient was oriented to gym and equipment including functions, settings, policies, and procedures.  Patient's individual exercise prescription and treatment plan were reviewed.  All starting workloads were established based on the results of the 6 minute walk test done at initial orientation visit.  The plan for exercise progression was also introduced and progression will be customized based on patient's performance and goals. 30 Day review completed. Medical Director ITP review done, changes made as directed, and signed approval by Medical Director.   New to program Pt has been out since 04/17/21.  He had his first cataract surgery on 3/22 and second one scheduled on 4/4.    [place] Name 05/11/21 1447 05/16/21 1232 05/17/21 1357 06/14/21 0906 06/20/21 1122   ITP Comments Patient has been out for cataract surgery dated from 4/4. He will return to rehab next Tuesday, 4/18. Zachary Guerrero has not yet returned after cataract surgery. 30 Day review completed. Medical Director ITP review done, changes made as directed, and signed approval by Medical Director. 30 Day review completed. Medical Director ITP review done, changes made as directed, and signed approval by Medical Director. Completed intial RD consultation    [place] Name 07/12/21 1330 08/09/21 1017         ITP Comments 30 Day review completed. Medical Director ITP review done, changes made as directed, and signed approval by Medical Director. 30 Day review completed. Medical Director ITP review done, changes made as directed, and signed approval by Medical Director.               Comments:

## 2021-08-15 VITALS — Ht 71.25 in | Wt 273.7 lb

## 2021-08-15 DIAGNOSIS — J449 Chronic obstructive pulmonary disease, unspecified: Secondary | ICD-10-CM

## 2021-08-15 NOTE — Progress Notes (Signed)
Daily Session Note  Patient Details  Name: Zachary Guerrero MRN: [id number] Date of Birth: 1946-01-26 Referring Provider:   Flowsheet Row Pulmonary Rehab from 04/03/2021 in [facility] Cardiac and Pulmonary Rehab  Referring Provider [place]       Encounter Date: 08/15/2021  Check In:  Session Check In - 08/15/21 0935       Check-In   Supervising physician immediately available to respond to emergencies See telemetry face sheet for immediately available ER MD    Location [facility]-Cardiac & Pulmonary Rehab    Staff Present Earlean Shawl, BS, ACSM CEP, Exercise Physiologist;Nathania Waldman, RN,BC,MSN;Jessica [place], MA, RCEP, CCRP, CCET    Virtual Visit No    Medication changes reported     No    Fall or balance concerns reported    No    Warm-up and Cool-down Performed on first and last piece of equipment    Resistance Training Performed Yes    VAD Patient? No    PAD/SET Patient? No      Pain Assessment   Currently in Pain? No/denies    Multiple Pain Sites No                Social History   Tobacco Use  Smoking Status Former   Packs/day: 2.00   Years: 20.00   Total pack years: 40.00   Types: Cigarettes   Quit date: 03/31/2010   Years since quitting: 11.3  Smokeless Tobacco Former    Goals Met:  Independence with exercise equipment Exercise tolerated well No report of concerns or symptoms today  Goals Unmet:  Not Applicable  Comments: Pt able to follow exercise prescription today without complaint.  Will continue to monitor for progression.    [place] Name 04/03/21 1117 08/15/21 1041       6 Minute Walk   Phase Initial Discharge    Distance 1450 feet 1400 feet    Distance % Change -- -3.4 %    Distance Feet Change -- -50 ft    Walk Time 6 minutes 6 minutes    # of Rest Breaks 0 0    MPH 2.75 2.65    METS 2.65 2.5    RPE 13 15    Perceived Dyspnea  3 3    VO2 Peak 9.27 8.75    Symptoms Yes (comment) Yes (comment)    Comments SOB SOB     Resting HR 68 bpm 57 bpm    Resting BP 138/60 106/64    Resting Oxygen Saturation  94 % 97 %    Exercise Oxygen Saturation  during 6 min walk 88 % 84 %    Max Ex. HR 103 bpm 99 bpm    Max Ex. BP 154/68 168/84    2 Minute Post BP 132/62 132/74      Interval HR   1 Minute HR 84 75    2 Minute HR 98 85    3 Minute HR 100 90    4 Minute HR 98 95    5 Minute HR 103 95    6 Minute HR 101 99    2 Minute Post HR 82 84    Interval Heart Rate? Yes Yes      Interval Oxygen   Interval Oxygen? Yes Yes    Baseline Oxygen Saturation % 94 % 97 %    1 Minute Oxygen Saturation % 91 % 91 %    1 Minute Liters of Oxygen 0  L 0 L  Room Air    2 Minute Oxygen Saturation % 92 % 88 %    2 Minute Liters of Oxygen 0 L 0 L    3 Minute Oxygen Saturation % 89 % 87 %    3 Minute Liters of Oxygen 0 L 0 L    4 Minute Oxygen Saturation % 88 % 86 %    4 Minute Liters of Oxygen 0 L 0 L    5 Minute Oxygen Saturation % 88 % 84 %    5 Minute Liters of Oxygen 0 L 0 L    6 Minute Oxygen Saturation % 88 % 84 %    6 Minute Liters of Oxygen 0 L 0 L    2 Minute Post Oxygen Saturation % 92 % 91 %    2 Minute Post Liters of Oxygen 0 L 0 L              Dr. Emily Filbert is Medical Director for [place].  Dr. Ottie Glazier is Medical Director for [facility] Pulmonary Rehabilitation.

## 2021-08-15 NOTE — Patient Instructions (Signed)
Discharge Patient Instructions  Patient Details  Name: Zachary Guerrero MRN: [id number] Date of Birth: 07/30/1945 Referring Provider:  Marguarite Arbour, MD   Number of Visits: 21  Reason for Discharge:  Patient reached a stable level of exercise. Patient independent in their exercise. Patient has met program and personal goals.  Smoking History:  Social History   Tobacco Use  Smoking Status Former   Packs/day: 2.00   Years: 20.00   Total pack years: 40.00   Types: Cigarettes   Quit date: 03/31/2010   Years since quitting: 11.3  Smokeless Tobacco Former    Diagnosis:  Chronic obstructive pulmonary disease, unspecified COPD type (HCC)  Initial Exercise Prescription:  Initial Exercise Prescription - 04/03/21 1100       Date of Initial Exercise RX and Referring Provider   Date 04/03/21    Referring Provider [place]      Oxygen   Maintain Oxygen Saturation 88% or higher      Treadmill   MPH 2    Grade 0.5    Minutes 15    METs 2.65      Recumbant Bike   Level 1    RPM 60    Watts 25    Minutes 15    METs 2.65      NuStep   Level 2    SPM 80    Minutes 15    METs 2.65      Elliptical   Level 1    Speed 2.5    Minutes 15    METs 2.65      T5 Nustep   Level 2    SPM 80    Minutes 15    METs 2.65      Prescription Details   Frequency (times per week) 3    Duration Progress to 30 minutes of continuous aerobic without signs/symptoms of physical distress      Intensity   THRR 40-80% of Max Heartrate 99-131    Ratings of Perceived Exertion 11-15    Perceived Dyspnea 0-4      Resistance Training   Training Prescription Yes    Weight 3 lb    Reps 10-15             Discharge Exercise Prescription (Final Exercise Prescription Changes):  Exercise Prescription Changes - 08/09/21 1100       Response to Exercise   Blood Pressure (Admit) 126/64    Blood Pressure (Exit) 120/64    Heart Rate (Admit) 92 bpm    Heart Rate (Exercise) 97 bpm     Heart Rate (Exit) 61 bpm    Oxygen Saturation (Admit) 93 %    Oxygen Saturation (Exercise) 92 %    Oxygen Saturation (Exit) 94 %    Rating of Perceived Exertion (Exercise) 14    Perceived Dyspnea (Exercise) 3    Symptoms SOB    Duration Continue with 30 min of aerobic exercise without signs/symptoms of physical distress.    Intensity THRR unchanged      Progression   Progression Continue to progress workloads to maintain intensity without signs/symptoms of physical distress.    Average METs 1.96      Resistance Training   Training Prescription Yes    Weight 7 lb    Reps 10-15      Interval Training   Interval Training No      NuStep   Level 4    Minutes 15    METs 2  Track   Laps 17    Minutes 15    METs 1.92      Home Exercise Plan   Plans to continue exercise at Home (comment)   walk   Frequency Add 2 additional days to program exercise sessions.    Initial Home Exercises Provided 06/06/21      Oxygen   Maintain Oxygen Saturation 88% or higher             Functional Capacity:  6 Minute Walk     Row Name 04/03/21 1117 08/15/21 1041       6 Minute Walk   Phase Initial Discharge    Distance 1450 feet 1400 feet    Distance % Change -- -3.4 %    Distance Feet Change -- -50 ft    Walk Time 6 minutes 6 minutes    # of Rest Breaks 0 0    MPH 2.75 2.65    METS 2.65 2.5    RPE 13 15    Perceived Dyspnea  3 3    VO2 Peak 9.27 8.75    Symptoms Yes (comment) Yes (comment)    Comments SOB SOB    Resting HR 68 bpm 57 bpm    Resting BP 138/60 106/64    Resting Oxygen Saturation  94 % 97 %    Exercise Oxygen Saturation  during 6 min walk 88 % 84 %    Max Ex. HR 103 bpm 99 bpm    Max Ex. BP 154/68 168/84    2 Minute Post BP 132/62 132/74      Interval HR   1 Minute HR 84 75    2 Minute HR 98 85    3 Minute HR 100 90    4 Minute HR 98 95    5 Minute HR 103 95    6 Minute HR 101 99    2 Minute Post HR 82 84    Interval Heart Rate? Yes Yes       Interval Oxygen   Interval Oxygen? Yes Yes    Baseline Oxygen Saturation % 94 % 97 %    1 Minute Oxygen Saturation % 91 % 91 %    1 Minute Liters of Oxygen 0 L 0 L  Room Air    2 Minute Oxygen Saturation % 92 % 88 %    2 Minute Liters of Oxygen 0 L 0 L    3 Minute Oxygen Saturation % 89 % 87 %    3 Minute Liters of Oxygen 0 L 0 L    4 Minute Oxygen Saturation % 88 % 86 %    4 Minute Liters of Oxygen 0 L 0 L    5 Minute Oxygen Saturation % 88 % 84 %    5 Minute Liters of Oxygen 0 L 0 L    6 Minute Oxygen Saturation % 88 % 84 %    6 Minute Liters of Oxygen 0 L 0 L    2 Minute Post Oxygen Saturation % 92 % 91 %    2 Minute Post Liters of Oxygen 0 L 0 L            Nutrition & Weight - Outcomes:  Pre Biometrics - 04/03/21 1131       Pre Biometrics   Height 5' 11.25" (1.81 m)    Weight 266 lb 11.2 oz (121 kg)    BMI (Calculated) 36.93    Single Leg Stand 4.3 seconds  Nutrition:  Nutrition Therapy & Goals - 06/20/21 1015       Nutrition Therapy   Diet Heart healthy, low Na, T2DM MNT    Protein (specify units) 90-95g    Fiber 30 grams    Whole Grain Foods 3 servings    Saturated Fats 16 max. grams    Fruits and Vegetables 8 servings/day    Sodium 2 grams      Personal Nutrition Goals   Nutrition Goal ST: add nuts/seeds to cereal, plan meals using MyPlate guidelines, add in satitaing snacks that include fat, fiber, and protein like homemade tzatziki with vegetables and whole wheat pita chips or cottage cheese with fruit.  LT: prevent BG lows, follow MyPlate guidelines, honor hunger.    Comments 76 y.o. M admitted to pulmonary rehab for COPD. PMHx includes a fib., CAD, HLD, nephrolithiasis, reiter's disease, T2DM, barrett's esophagus. PSHx includes cholecystectomy. Relevant medications includes lantus, metformin, mag-ox, omeprazole, Semaglutide-Weight Management, quetiapine, glimepiride. PYP Score: 57. Vegetables & Fruits 6/12. Breads, Grains & Cereals 7/12.  Red & Processed Meat 6/12. Poultry 2/2. Fish & Shellfish 4/4. Beans, Nuts & Seeds 3/4. Milk & Dairy Foods 6/6. Toppings, Oils, Seasonings & Salt 6/20. Sweets, Snacks & Restaurant Food 9/14. Beverages 8/10. Drinks: Coffee with some cream and water. B: cereal (oatmeal and corn flakes with banana) or hard boiled eggs. He reports getting bored. L: salads (tomatoes, cucumber, onion, olives, boiled egg, radishes) and sandwiches - peanut butter and banana ( whole wheat bread), soup, leftovers, cheeseburger 1x/week, out to eat 1x/week. D: uses olive oil, pinto beans, corn, peaches, cottage cheese, hamburger, chicken mostly, will sometimes go out. He has weight cycled previously and most recently was trying noom to help with weight loss. Encouraged that heart healthy habits with help with his health outcomes even outside of weight loss. Discussed honoring hunger and limiting excess calories in the form of fats like oil and added sugar as these can add calories without necessarily adding satiation. Discussed heart healthy eating and MyPlate guidelines as well as T2DM MNT. [name] reports having low BG semi-frequesntly and usually when he will skip a meal - discussed the importance of eating consistent CHO during the day and speaking with his MD regarding these lows as he is on Lantus. Discussed increase energy and protein needs with COPD.      Intervention Plan   Intervention Prescribe, educate and counsel regarding individualized specific dietary modifications aiming towards targeted core components such as weight, hypertension, lipid management, diabetes, heart failure and other comorbidities.;Nutrition handout(s) given to patient.    Expected Outcomes Short Term Goal: A plan has been developed with personal nutrition goals set during dietitian appointment.;Short Term Goal: Understand basic principles of dietary content, such as calories, fat, sodium, cholesterol and nutrients.;Long Term Goal: Adherence to prescribed  nutrition plan.             Nutrition Discharge:   Education Questionnaire Score:  Knowledge Questionnaire Score - 07/25/21 1040       Knowledge Questionnaire Score   Pre Score 16/18    Post Score 15/18             Goals reviewed with patient; copy given to patient.

## 2021-08-17 ENCOUNTER — Encounter: Payer: Medicare PPO | Admitting: *Deleted

## 2021-08-17 DIAGNOSIS — J449 Chronic obstructive pulmonary disease, unspecified: Secondary | ICD-10-CM | POA: Diagnosis not present

## 2021-08-17 NOTE — Progress Notes (Signed)
Daily Session Note  Patient Details  Name: Zachary Guerrero MRN: [id number] Date of Birth: 1945/05/26 Referring Provider:   April Manson Pulmonary Rehab from 04/03/2021 in [facility] Cardiac and Pulmonary Rehab  Referring Provider [place]       Encounter Date: 08/17/2021  Check In:  Session Check In - 08/17/21 0945       Check-In   Supervising physician immediately available to respond to emergencies See telemetry face sheet for immediately available ER MD    Location [facility]-Cardiac & Pulmonary Rehab    Staff Present Heath Lark, RN, BSN, CCRP;Kristen Coble, RN,BC,MSN;Melissa [place], RDN, LDN;Jessica [place], MA, RCEP, CCRP, CCET    Virtual Visit No    Medication changes reported     No    Fall or balance concerns reported    No    Warm-up and Cool-down Performed on first and last piece of equipment    Resistance Training Performed Yes    VAD Patient? No    PAD/SET Patient? No      Pain Assessment   Currently in Pain? No/denies                Social History   Tobacco Use  Smoking Status Former   Packs/day: 2.00   Years: 20.00   Total pack years: 40.00   Types: Cigarettes   Quit date: 03/31/2010   Years since quitting: 11.3  Smokeless Tobacco Former    Goals Met:  Independence with exercise equipment Exercise tolerated well No report of concerns or symptoms today  Goals Unmet:  Not Applicable  Comments: Pt able to follow exercise prescription today without complaint.  Will continue to monitor for progression.    Dr. Emily Filbert is Medical Director for [place].  Dr. Ottie Glazier is Medical Director for [facility] Pulmonary Rehabilitation.

## 2021-08-22 DIAGNOSIS — J449 Chronic obstructive pulmonary disease, unspecified: Secondary | ICD-10-CM | POA: Diagnosis not present

## 2021-08-22 NOTE — Progress Notes (Signed)
Daily Session Note  Patient Details  Name: Zachary Guerrero MRN: [id number] Date of Birth: [birth year] Referring Provider:   April Manson Pulmonary Rehab from 04/03/2021 in [facility] Cardiac and Pulmonary Rehab  Referring Provider [place]       Encounter Date: 08/22/2021  Check In:  Session Check In - 08/22/21 0924       Check-In   Supervising physician immediately available to respond to emergencies See telemetry face sheet for immediately available ER MD    Location [facility]-Cardiac & Pulmonary Rehab    Staff Present Earlean Shawl, BS, ACSM CEP, Exercise Physiologist;Shiva Karis, RN,BC,MSN;Jessica [place], MA, RCEP, CCRP, CCET;Melissa [place], RDN, LDN    Virtual Visit No    Medication changes reported     No    Fall or balance concerns reported    No    Tobacco Cessation No Change    Warm-up and Cool-down Performed on first and last piece of equipment    Resistance Training Performed Yes    VAD Patient? No    PAD/SET Patient? No      Pain Assessment   Currently in Pain? No/denies    Multiple Pain Sites No                Social History   Tobacco Use  Smoking Status Former   Packs/day: 2.00   Years: 20.00   Total pack years: 40.00   Types: Cigarettes   Quit date: 03/31/2010   Years since quitting: 11.4  Smokeless Tobacco Former    Goals Met:  Independence with exercise equipment Exercise tolerated well Personal goals reviewed No report of concerns or symptoms today Strength training completed today  Goals Unmet:  Not Applicable  Comments: Pt able to follow exercise prescription today without complaint.  Will continue to monitor for progression.    Dr. Emily Filbert is Medical Director for [place].  Dr. Ottie Glazier is Medical Director for [facility] Pulmonary Rehabilitation.

## 2021-08-23 ENCOUNTER — Encounter: Payer: Medicare PPO | Admitting: *Deleted

## 2021-08-23 DIAGNOSIS — J449 Chronic obstructive pulmonary disease, unspecified: Secondary | ICD-10-CM | POA: Diagnosis not present

## 2021-08-23 NOTE — Progress Notes (Signed)
Daily Session Note  Patient Details  Name: PATSY VARMA MRN: [id number] Date of Birth: 1945/07/09 Referring Provider:   April Manson Pulmonary Rehab from 04/03/2021 in [facility] Cardiac and Pulmonary Rehab  Referring Provider [place]       Encounter Date: 08/23/2021  Check In:  Session Check In - 08/23/21 1035       Check-In   Supervising physician immediately available to respond to emergencies See telemetry face sheet for immediately available ER MD    Location [facility]-Cardiac & Pulmonary Rehab    Staff Present Heath Lark, RN, BSN, CCRP;Jessica [place], MA, RCEP, CCRP, [place], BS, ACSM CEP, Exercise Physiologist    Virtual Visit No    Medication changes reported     No    Fall or balance concerns reported    No    Warm-up and Cool-down Performed on first and last piece of equipment    Resistance Training Performed Yes    VAD Patient? No    PAD/SET Patient? No      Pain Assessment   Currently in Pain? No/denies                Social History   Tobacco Use  Smoking Status Former   Packs/day: 2.00   Years: 20.00   Total pack years: 40.00   Types: Cigarettes   Quit date: 03/31/2010   Years since quitting: 11.4  Smokeless Tobacco Former    Goals Met:  Proper associated with RPD/PD & O2 Sat Independence with exercise equipment Exercise tolerated well No report of concerns or symptoms today  Goals Unmet:  Not Applicable  Comments: Pt able to follow exercise prescription today without complaint.  Will continue to monitor for progression.    Dr. Emily Filbert is Medical Director for [place].  Dr. Ottie Glazier is Medical Director for [facility] Pulmonary Rehabilitation.

## 2021-08-29 ENCOUNTER — Encounter: Payer: Medicare PPO | Attending: Internal Medicine | Admitting: *Deleted

## 2021-08-29 DIAGNOSIS — J449 Chronic obstructive pulmonary disease, unspecified: Secondary | ICD-10-CM | POA: Diagnosis present

## 2021-08-29 NOTE — Progress Notes (Signed)
Daily Session Note  Patient Details  Name: Zachary Guerrero MRN: [id number] Date of Birth: [birth year] Referring Provider:   April Manson Pulmonary Rehab from 04/03/2021 in [facility] Cardiac and Pulmonary Rehab  Referring Provider [place]       Encounter Date: 08/29/2021  Check In:  Session Check In - 08/29/21 1034       Check-In   Supervising physician immediately available to respond to emergencies See telemetry face sheet for immediately available ER MD    Location [facility]-Cardiac & Pulmonary Rehab    Staff Present Heath Lark, RN, BSN, [place], BS, ACSM CEP, Exercise Physiologist;Noah Tickle, BS, Exercise Physiologist    Virtual Visit No    Medication changes reported     No    Fall or balance concerns reported    No    Warm-up and Cool-down Performed on first and last piece of equipment    Resistance Training Performed Yes    VAD Patient? No    PAD/SET Patient? No      Pain Assessment   Currently in Pain? No/denies                Social History   Tobacco Use  Smoking Status Former   Packs/day: 2.00   Years: 20.00   Total pack years: 40.00   Types: Cigarettes   Quit date: 03/31/2010   Years since quitting: 11.4  Smokeless Tobacco Former    Goals Met:  Independence with exercise equipment Exercise tolerated well Personal goals reviewed No report of concerns or symptoms today  Goals Unmet:  Not Applicable  Comments:  Zachary Guerrero graduated today from  rehab with 36 sessions completed.  Details of the patient's exercise prescription and what He needs to do in order to continue the prescription and progress were discussed with patient.  Patient was given a copy of prescription and goals.  Patient verbalized understanding.  Zachary Guerrero plans to continue to exercise by joining the [facility]    Dr. Emily Filbert is Medical Director for [place].  Dr. Ottie Glazier is Medical Director for [facility] Pulmonary Rehabilitation.

## 2021-08-29 NOTE — Progress Notes (Signed)
Pulmonary Individual Treatment Plan  Patient Details  Name: Zachary Guerrero MRN: [id number] Date of Birth: 1945/04/07 Referring Provider:   Flowsheet Row Pulmonary Rehab from 04/03/2021 in [facility] Card[facility] and Pulmonary Rehab  Referring Provider Sparks       Initial Encounter Date:  Flowsheet Row Pulmonary Rehab from 04/03/2021 in [facility] Cardiac and Pulmonary Rehab  Date 04/03/21       Visit Diagnosis: Chronic obstructive pulmonary disease, unspecified COPD type ([place])  Patient's Home Medications on Admission:  Current Outpatient Medications:    amiodarone (PACERONE) 200 MG tablet, Take 1 tablet by mouth 2 (two) times daily at 8 am and 10 pm., Disp: , Rfl:    gabapentin (NEURONTIN) 300 MG capsule, TAKE 1 CAPSULE BY MOUTH THREE TIMES A DAY (Patient not taking: Reported on 07/26/2021), Disp: , Rfl:    glimepiride (AMARYL) 2 MG tablet, Take 4 mg by mouth daily with breakfast., Disp: , Rfl:    LANTUS 100 UNIT/ML injection, Inject 15 Units into the skin at bedtime., Disp: , Rfl: 0   loratadine (CLARITIN) 10 MG tablet, Take by mouth., Disp: , Rfl:    lovastatin (MEVACOR) 40 MG tablet, Take 40 mg by mouth daily. AM, Disp: , Rfl: 0   magnesium oxide (MAG-OX) 400 MG tablet, Take 400 mg by mouth daily. AM, Disp: , Rfl:    meloxicam (MOBIC) 15 MG tablet, Take 1 tablet by mouth daily., Disp: , Rfl:    metFORMIN (GLUCOPHAGE) 1000 MG tablet, Take 1,000 mg by mouth 2 (two) times daily. AM, Disp: , Rfl: 1   metoprolol succinate (TOPROL-XL) 50 MG 24 hr tablet, Take 100 mg by mouth daily., Disp: , Rfl:    omeprazole (PRILOSEC) 20 MG capsule, Take 20 mg by mouth daily. HS, Disp: , Rfl: 4   PRADAXA 150 MG CAPS capsule, Take 150 mg by mouth 2 (two) times daily. AM AND PM, Disp: , Rfl:    PROAIR HFA 108 (90 BASE) MCG/ACT inhaler, Inhale 2 puffs into the lungs every 6 (six) hours as needed. For shortness of breath and/or wheezing./ AM AND PM, Disp: , Rfl:    QUEtiapine (SEROQUEL) 25 MG tablet, Take by mouth.,  Disp: , Rfl:    Semaglutide-Weight Management 0.5 MG/0.5ML SOAJ, Inject into the skin., Disp: , Rfl:    TRELEGY ELLIPTA 100-62.5-25 MCG/ACT AEPB, Inhale into the lungs., Disp: , Rfl:   Past Medical History: Past Medical History:  Diagnosis Date   Blepharoptosis    BILATERAL   Chronic kidney disease    KIDNEY STONES   COPD (chronic obstructive pulmonary disease) ([place])    Coronary artery disease    Diabetes mellitus without complication (HCC)    Dysrhythmia    A-FIB/DR KOWALSKI   Hypercholesteremia    Hypertension    CONTROLLED ON MEDS   Pneumonia    IN PAST   Reiter's syndrome    Shortness of breath dyspnea    WITH EXERTION   Sleep apnea    C-PAP   Wears dentures    UPPERS    Tobacco Use: Social History   Tobacco Use  Smoking Status Former   Packs/day: 2.00   Years: 20.00   Total pack years: 40.00   Types: Cigarettes   Quit date: 03/31/2010   Years since quitting: 11.4  Smokeless Tobacco Former    Labs: Review Flowsheet        No data to display           Pulmonary Assessment Scores:  Pulmonary Assessment  Scores     Row Name 04/03/21 1134 07/25/21 1041       ADL UCSD   ADL Phase -- Exit    SOB Score total 58 60    Rest 3 0    Walk 3 1    Stairs 4 4    Bath 2 3    Dress 1 2    Shop 2 2      CAT Score   CAT Score 25 25      mMRC Score   mMRC Score 1 --             UCSD: Self-administered rating of dyspnea associated with activities of daily living (ADLs) 6-point scale (0 = "not at all" to 5 = "maximal or unable to do because of breathlessness")  Scoring Scores range from 0 to 120.  Minimally important difference is 5 units  CAT: CAT can identify the health impairment of COPD patients and is better correlated with disease progression.  CAT has a scoring range of zero to 40. The CAT score is classified into four groups of low (less than 10), medium (10 - 20), high (21-30) and very high (31-40) based on the impact level of disease on  health status. A CAT score over 10 suggests significant symptoms.  A worsening CAT score could be explained by an exacerbation, poor medication adherence, poor inhaler technique, or progression of COPD or comorbid conditions.  CAT MCID is 2 points  mMRC: mMRC (Modified Medical Research Council) Dyspnea Scale is used to assess the degree of baseline functional disability in patients of respiratory disease due to dyspnea. No minimal important difference is established. A decrease in score of 1 point or greater is considered a positive change.   Pulmonary Function Assessment:  Pulmonary Function Assessment - 04/03/21 1134       Pulmonary Function Tests   FVC% 75 %    FEV1% 59 %    FEV1/FVC Ratio 61.41             Exercise Target Goals: Exercise Program Goal: Individual exercise prescription set using results from initial 6 min walk test and THRR while considering  patient's activity barriers and safety.   Exercise Prescription Goal: Initial exercise prescription builds to 30-45 minutes a day of aerobic activity, 2-3 days per week.  Home exercise guidelines will be given to patient during program as part of exercise prescription that the participant will acknowledge.  Education: Aerobic Exercise: - Group verbal and visual presentation on the components of exercise prescription. Introduces F.I.T.T principle from ACSM for exercise prescriptions.  Reviews F.I.T.T. principles of aerobic exercise including progression. Written material given at graduation. Flowsheet Row Pulmonary Rehab from 06/14/2021 in [facility] Cardiac and Pulmonary Rehab  Date 05/31/21  Educator [facility]  Instruction Review Code 1- Verbalizes Understanding       Education: Resistance Exercise: - Group verbal and visual presentation on the components of exercise prescription. Introduces F.I.T.T principle from ACSM for exercise prescriptions  Reviews F.I.T.T. principles of resistance exercise including progression. Written  material given at graduation. Flowsheet Row Pulmonary Rehab from 06/14/2021 in [facility] Cardiac and Pulmonary Rehab  Date 04/06/21  Educator AS  Instruction Review Code 1- Verbalizes Understanding        Education: Exercise & Equipment Safety: - Individual verbal instruction and demonstration of equipment use and safety with use of the equipment. Flowsheet Row Pulmonary Rehab from 06/14/2021 in [facility] Cardiac and Pulmonary Rehab  Date 04/03/21  Educator AS  Instruction Review  Code 1- Verbalizes Understanding       Education: Exercise Physiology & General Exercise Guidelines: - Group verbal and written instruction with models to review the exercise physiology of the cardiovascular system and associated critical values. Provides general exercise guidelines with specific guidelines to those with heart or lung disease.    Education: Flexibility, Balance, Mind/Body Relaxation: - Group verbal and visual presentation with interactive activity on the components of exercise prescription. Introduces F.I.T.T principle from ACSM for exercise prescriptions. Reviews F.I.T.T. principles of flexibility and balance exercise training including progression. Also discusses the mind body connection.  Reviews various relaxation techniques to help reduce and manage stress (i.e. Deep breathing, progressive muscle relaxation, and visualization). Balance handout provided to take home. Written material given at graduation. Flowsheet Row Pulmonary Rehab from 06/14/2021 in [facility] Cardiac and Pulmonary Rehab  Date 06/14/21  Educator [facility]  Instruction Review Code 1- Verbalizes Understanding       Activity Barriers & Risk Stratification:  Activity Barriers & Cardiac Risk Stratification - 03/20/21 1124       Activity Barriers & Cardiac Risk Stratification   Activity Barriers Shortness of Breath;Deconditioning;Muscular Weakness             6 Minute Walk:  6 Minute Walk     Row Name 04/03/21 1117 08/15/21 1041        6 Minute Walk   Phase Initial Discharge    Distance 1450 feet 1400 feet    Distance % Change -- -3.4 %    Distance Feet Change -- -50 ft    Walk Time 6 minutes 6 minutes    # of Rest Breaks 0 0    MPH 2.75 2.65    METS 2.65 2.5    RPE 13 15    Perceived Dyspnea  3 3    VO2 Peak 9.27 8.75    Symptoms Yes (comment) Yes (comment)    Comments SOB SOB    Resting HR 68 bpm 57 bpm    Resting BP 138/60 106/64    Resting Oxygen Saturation  94 % 97 %    Exercise Oxygen Saturation  during 6 min walk 88 % 84 %    Max Ex. HR 103 bpm 99 bpm    Max Ex. BP 154/68 168/84    2 Minute Post BP 132/62 132/74      Interval HR   1 Minute HR 84 75    2 Minute HR 98 85    3 Minute HR 100 90    4 Minute HR 98 95    5 Minute HR 103 95    6 Minute HR 101 99    2 Minute Post HR 82 84    Interval Heart Rate? Yes Yes      Interval Oxygen   Interval Oxygen? Yes Yes    Baseline Oxygen Saturation % 94 % 97 %    1 Minute Oxygen Saturation % 91 % 91 %    1 Minute Liters of Oxygen 0 L 0 L  Room Air    2 Minute Oxygen Saturation % 92 % 88 %    2 Minute Liters of Oxygen 0 L 0 L    3 Minute Oxygen Saturation % 89 % 87 %    3 Minute Liters of Oxygen 0 L 0 L    4 Minute Oxygen Saturation % 88 % 86 %    4 Minute Liters of Oxygen 0 L 0 L    5 Minute Oxygen  Saturation % 88 % 84 %    5 Minute Liters of Oxygen 0 L 0 L    6 Minute Oxygen Saturation % 88 % 84 %    6 Minute Liters of Oxygen 0 L 0 L    2 Minute Post Oxygen Saturation % 92 % 91 %    2 Minute Post Liters of Oxygen 0 L 0 L            Oxygen Initial Assessment:  Oxygen Initial Assessment - 03/20/21 1133       Home Oxygen   Home Oxygen Device None    Sleep Oxygen Prescription None    Home Exercise Oxygen Prescription None    Home Resting Oxygen Prescription None      Initial 6 min Walk   Oxygen Used None      Program Oxygen Prescription   Program Oxygen Prescription None      Intervention   Short Term Goals To learn and  understand importance of monitoring SPO2 with pulse oximeter and demonstrate accurate use of the pulse oximeter.;To learn and understand importance of maintaining oxygen saturations>88%;To learn and demonstrate proper pursed lip breathing techniques or other breathing techniques. ;To learn and demonstrate proper use of respiratory medications    Long  Term Goals Verbalizes importance of monitoring SPO2 with pulse oximeter and return demonstration;Maintenance of O2 saturations>88%;Exhibits proper breathing techniques, such as pursed lip breathing or other method taught during program session;Compliance with respiratory medication;Demonstrates proper use of MDI's             Oxygen Re-Evaluation:  Oxygen Re-Evaluation     Row Name 04/04/21 0916 06/06/21 1008 06/20/21 0937 07/25/21 0955 08/22/21 0940     Program Oxygen Prescription   Program Oxygen Prescription None None None None None     Home Oxygen   Home Oxygen Device None None None None None   Sleep Oxygen Prescription None None None None None   Home Exercise Oxygen Prescription None None None None None   Home Resting Oxygen Prescription None None None None None     Goals/Expected Outcomes   Short Term Goals To learn and understand importance of monitoring SPO2 with pulse oximeter and demonstrate accurate use of the pulse oximeter.;To learn and understand importance of maintaining oxygen saturations>88%;To learn and demonstrate proper pursed lip breathing techniques or other breathing techniques.  To learn and understand importance of monitoring SPO2 with pulse oximeter and demonstrate accurate use of the pulse oximeter.;To learn and understand importance of maintaining oxygen saturations>88%;To learn and demonstrate proper pursed lip breathing techniques or other breathing techniques.  To learn and understand importance of monitoring SPO2 with pulse oximeter and demonstrate accurate use of the pulse oximeter.;To learn and understand  importance of maintaining oxygen saturations>88%;To learn and demonstrate proper pursed lip breathing techniques or other breathing techniques.  To learn and understand importance of monitoring SPO2 with pulse oximeter and demonstrate accurate use of the pulse oximeter.;To learn and understand importance of maintaining oxygen saturations>88%;To learn and demonstrate proper pursed lip breathing techniques or other breathing techniques.  To learn and understand importance of monitoring SPO2 with pulse oximeter and demonstrate accurate use of the pulse oximeter.;To learn and understand importance of maintaining oxygen saturations>88%;To learn and demonstrate proper pursed lip breathing techniques or other breathing techniques.    Long  Term Goals Verbalizes importance of monitoring SPO2 with pulse oximeter and return demonstration;Maintenance of O2 saturations>88%;Exhibits proper breathing techniques, such as pursed lip breathing or other method taught during  program session;Compliance with respiratory medication Verbalizes importance of monitoring SPO2 with pulse oximeter and return demonstration;Maintenance of O2 saturations>88%;Exhibits proper breathing techniques, such as pursed lip breathing or other method taught during program session;Compliance with respiratory medication Verbalizes importance of monitoring SPO2 with pulse oximeter and return demonstration;Maintenance of O2 saturations>88%;Exhibits proper breathing techniques, such as pursed lip breathing or other method taught during program session;Compliance with respiratory medication Verbalizes importance of monitoring SPO2 with pulse oximeter and return demonstration;Maintenance of O2 saturations>88%;Exhibits proper breathing techniques, such as pursed lip breathing or other method taught during program session;Compliance with respiratory medication Verbalizes importance of monitoring SPO2 with pulse oximeter and return demonstration;Maintenance of O2  saturations>88%;Exhibits proper breathing techniques, such as pursed lip breathing or other method taught during program session;Compliance with respiratory medication   Comments Reviewed PLB technique with pt.  Talked about how it works and it's importance in maintaining their exercise saturations. Reviewed PLB technique with pt.  Talked about how it works and it's importance in maintaining their exercise saturations. Short; Continue to use PLB to help with breathing Long: Continue to improve breathing Zachary Guerrero was doing better with his breathing until he went out of rhythm.  The prednisone last week really helped.  He is good about using his PLB and using his inhalers. Zachary Guerrero continues to take all respiratory meds and use PLB to help control SOB.   Goals/Expected Outcomes Short: Become more profiecient at using PLB.   Long: Become independent at using PLB. Short: Become more profiecient at using PLB.   Long: Become independent at using PLB. -- Short: Continue to improve PLB use Long: Continue to work on breathing. Continue to take all meds and independently use PLB techniques.            Oxygen Discharge (Final Oxygen Re-Evaluation):  Oxygen Re-Evaluation - 08/22/21 0940       Program Oxygen Prescription   Program Oxygen Prescription None      Home Oxygen   Home Oxygen Device None    Sleep Oxygen Prescription None    Home Exercise Oxygen Prescription None    Home Resting Oxygen Prescription None      Goals/Expected Outcomes   Short Term Goals To learn and understand importance of monitoring SPO2 with pulse oximeter and demonstrate accurate use of the pulse oximeter.;To learn and understand importance of maintaining oxygen saturations>88%;To learn and demonstrate proper pursed lip breathing techniques or other breathing techniques.     Long  Term Goals Verbalizes importance of monitoring SPO2 with pulse oximeter and return demonstration;Maintenance of O2 saturations>88%;Exhibits proper  breathing techniques, such as pursed lip breathing or other method taught during program session;Compliance with respiratory medication    Comments Zachary Guerrero continues to take all respiratory meds and use PLB to help control SOB.    Goals/Expected Outcomes Continue to take all meds and independently use PLB techniques.             Initial Exercise Prescription:  Initial Exercise Prescription - 04/03/21 1100       Date of Initial Exercise RX and Referring Provider   Date 04/03/21    Referring Provider Sparks      Oxygen   Maintain Oxygen Saturation 88% or higher      Treadmill   MPH 2    Grade 0.5    Minutes 15    METs 2.65      Recumbant Bike   Level 1    RPM 60    Watts 25    Minutes  15    METs 2.65      NuStep   Level 2    SPM 80    Minutes 15    METs 2.65      Elliptical   Level 1    Speed 2.5    Minutes 15    METs 2.65      T5 Nustep   Level 2    SPM 80    Minutes 15    METs 2.65      Prescription Details   Frequency (times per week) 3    Duration Progress to 30 minutes of continuous aerobic without signs/symptoms of physical distress      Intensity   THRR 40-80% of Max Heartrate 99-131    Ratings of Perceived Exertion 11-15    Perceived Dyspnea 0-4      Resistance Training   Training Prescription Yes    Weight 3 lb    Reps 10-15             Perform Capillary Blood Glucose checks as needed.  Exercise Prescription Changes:   Exercise Prescription Changes     Row Name 04/03/21 1100 04/18/21 1200 05/31/21 1400 06/06/21 0900 06/06/21 1000     Response to Exercise   Blood Pressure (Admit) 138/60 132/80 102/62 -- --   Blood Pressure (Exercise) 154/68 138/82 140/64 -- --   Blood Pressure (Exit) 132/62 120/62 108/60 -- --   Heart Rate (Admit) 68 bpm 67 bpm 64 bpm -- --   Heart Rate (Exercise) 103 bpm 98 bpm 76 bpm -- --   Heart Rate (Exit) 82 bpm 76 bpm 65 bpm -- --   Oxygen Saturation (Admit) 94 % 97 % 93 % -- --   Oxygen Saturation  (Exercise) 88 % 90 % 90 % -- --   Oxygen Saturation (Exit) 92 % 93 % 92 % -- --   Rating of Perceived Exertion (Exercise) 13 13 13  -- --   Perceived Dyspnea (Exercise) 3 3 2  -- --   Symptoms SOB SOB SOB SOB SOB   Comments -- -- return from eye surgery return from eye surgery return from eye surgery   Duration -- Progress to 30 minutes of  aerobic without signs/symptoms of physical distress Progress to 30 minutes of  aerobic without signs/symptoms of physical distress Progress to 30 minutes of  aerobic without signs/symptoms of physical distress Progress to 30 minutes of  aerobic without signs/symptoms of physical distress   Intensity -- -- THRR unchanged THRR unchanged THRR unchanged     Progression   Progression -- -- Continue to progress workloads to maintain intensity without signs/symptoms of physical distress. Continue to progress workloads to maintain intensity without signs/symptoms of physical distress. Continue to progress workloads to maintain intensity without signs/symptoms of physical distress.   Average METs -- -- 2.82 2.82 2.82     Resistance Training   Training Prescription -- Yes Yes Yes Yes   Weight -- 5 lb 5 lb 5 lb 5 lb   Reps -- 10-15 10-15 10-15 10-15     Interval Training   Interval Training -- -- No No No     Treadmill   MPH -- 2 2 2 2    Grade -- 0 0.5 0.5 0.5   Minutes -- 15 15 15 15    METs -- 2.53 2.67 2.67 2.67     NuStep   Level -- -- 3 3 3    Minutes -- -- 15 15 15    METs -- --  3.2 3.2 3.2     T5 Nustep   Level -- 3 3 3 3    SPM -- 80 -- -- --   Minutes -- 15 15 15 15    METs -- 1.7 2.6 2.6 2.6     Home Exercise Plan   Plans to continue exercise at -- -- -- Home (comment)  walk Home (comment)  walk   Frequency -- -- -- Add 2 additional days to program exercise sessions. Add 2 additional days to program exercise sessions.   Initial Home Exercises Provided -- -- -- 06/06/21 06/06/21     Oxygen   Maintain Oxygen Saturation -- -- 88% or higher 88% or  higher 88% or higher    Row Name 06/12/21 1200 06/28/21 0900 07/12/21 1100 07/26/21 0700 08/09/21 1100     Response to Exercise   Blood Pressure (Admit) 112/62 120/70 104/72 130/70 126/64   Blood Pressure (Exercise) 128/64 -- -- -- --   Blood Pressure (Exit) 102/60 124/64 126/68 124/70 120/64   Heart Rate (Admit) 75 bpm 70 bpm 63 bpm 74 bpm 92 bpm   Heart Rate (Exercise) 112 bpm 93 bpm 93 bpm 97 bpm 97 bpm   Heart Rate (Exit) 75 bpm 77 bpm 64 bpm 78 bpm 61 bpm   Oxygen Saturation (Admit) 93 % 92 % 93 % 94 % 93 %   Oxygen Saturation (Exercise) 91 % 89 % 89 % 88 % 92 %   Oxygen Saturation (Exit) 94 % 93 % 93 % 92 % 94 %   Rating of Perceived Exertion (Exercise) 14 14 15 15 14    Perceived Dyspnea (Exercise) 2 3 3 3 3    Symptoms SOB SOB SOB SOB SOB   Duration Continue with 30 min of aerobic exercise without signs/symptoms of physical distress. Continue with 30 min of aerobic exercise without signs/symptoms of physical distress. Continue with 30 min of aerobic exercise without signs/symptoms of physical distress. Continue with 30 min of aerobic exercise without signs/symptoms of physical distress. Continue with 30 min of aerobic exercise without signs/symptoms of physical distress.   Intensity -- THRR unchanged THRR unchanged THRR unchanged THRR unchanged     Progression   Progression Continue to progress workloads to maintain intensity without signs/symptoms of physical distress. Continue to progress workloads to maintain intensity without signs/symptoms of physical distress. Continue to progress workloads to maintain intensity without signs/symptoms of physical distress. Continue to progress workloads to maintain intensity without signs/symptoms of physical distress. Continue to progress workloads to maintain intensity without signs/symptoms of physical distress.   Average METs 2.56 2.7 2.68 2.79 1.96     Resistance Training   Training Prescription Yes Yes Yes Yes Yes   Weight 6 lb 6 lb 6 lb 7  lb 7 lb   Reps 10-15 10-15 10-15 10-15 10-15     Interval Training   Interval Training No No No No No     Treadmill   MPH 2 2 2  -- --   Grade 0.5 0.5 0 -- --   Minutes 15 15 15  -- --   METs 2.67 2.67 2.63 -- --     NuStep   Level 5 4 5 5 4    Minutes 15 15 15 15 15    METs 3.9 3.6 3.7 3.7 2     T5 Nustep   Level 2 3 4  -- --   Minutes 15 15 15  -- --   METs 2.3 2.3 2.1 -- --     Track  Laps -- $Remov'14 30 30 17   'jkGLRH$ Minutes -- $RemoveBe'15 15 15 15   'FzidlqAam$ METs -- 1.76 2.63 2.63 1.92     Home Exercise Plan   Plans to continue exercise at Home (comment)  walk Home (comment)  walk Home (comment)  walk Home (comment)  walk Home (comment)  walk   Frequency Add 2 additional days to program exercise sessions. Add 2 additional days to program exercise sessions. Add 2 additional days to program exercise sessions. Add 2 additional days to program exercise sessions. Add 2 additional days to program exercise sessions.   Initial Home Exercises Provided 06/06/21 06/06/21 06/06/21 06/06/21 06/06/21     Oxygen   Maintain Oxygen Saturation 88% or higher 88% or higher 88% or higher 88% or higher 88% or higher    Row Name 08/21/21 1600             Response to Exercise   Blood Pressure (Admit) 108/64       Blood Pressure (Exit) 112/62       Heart Rate (Admit) 59 bpm       Heart Rate (Exercise) 98 bpm       Heart Rate (Exit) 79 bpm       Oxygen Saturation (Admit) 93 %       Oxygen Saturation (Exercise) 90 %       Oxygen Saturation (Exit) 93 %       Rating of Perceived Exertion (Exercise) 13       Perceived Dyspnea (Exercise) 3       Symptoms SOB       Duration Continue with 30 min of aerobic exercise without signs/symptoms of physical distress.       Intensity THRR unchanged         Progression   Progression Continue to progress workloads to maintain intensity without signs/symptoms of physical distress.       Average METs 3.21         Resistance Training   Training Prescription Yes       Weight 7 lb        Reps 10-15         Interval Training   Interval Training No         NuStep   Level 6       Minutes 15       METs 3.8         Track   Laps 30       Minutes 15       METs 2.63         Home Exercise Plan   Plans to continue exercise at Home (comment)  walk       Frequency Add 2 additional days to program exercise sessions.       Initial Home Exercises Provided 06/06/21         Oxygen   Maintain Oxygen Saturation 88% or higher                Exercise Comments:   Exercise Comments     Row Name 04/04/21 0915 08/29/21 1036         Exercise Comments First full day of exercise!  Patient was oriented to gym and equipment including functions, settings, policies, and procedures.  Patient's individual exercise prescription and treatment plan were reviewed.  All starting workloads were established based on the results of the 6 minute walk test done at initial orientation visit.  The plan for exercise progression was also  introduced and progression will be customized based on patient's performance and goals. Zachary Guerrero graduated today from  rehab with 36 sessions completed.  Details of the patient's exercise prescription and what He needs to do in order to continue the prescription and progress were discussed with patient.  Patient was given a copy of prescription and goals.  Patient verbalized understanding.  [name] plans to continue to exercise by joining the [facility].               Exercise Goals and Review:   Exercise Goals     Row Name 04/03/21 1130             Exercise Goals   Increase Physical Activity Yes       Intervention Provide advice, education, support and counseling about physical activity/exercise needs.;Develop an individualized exercise prescription for aerobic and resistive training based on initial evaluation findings, risk stratification, comorbidities and participant's personal goals.       Expected Outcomes Short Term: Attend rehab on a regular basis  to increase amount of physical activity.;Long Term: Add in home exercise to make exercise part of routine and to increase amount of physical activity.;Long Term: Exercising regularly at least 3-5 days a week.       Increase Strength and Stamina Yes       Intervention Provide advice, education, support and counseling about physical activity/exercise needs.;Develop an individualized exercise prescription for aerobic and resistive training based on initial evaluation findings, risk stratification, comorbidities and participant's personal goals.       Expected Outcomes Short Term: Increase workloads from initial exercise prescription for resistance, speed, and METs.;Short Term: Perform resistance training exercises routinely during rehab and add in resistance training at home;Long Term: Improve cardiorespiratory fitness, muscular endurance and strength as measured by increased METs and functional capacity (6MWT)       Able to understand and use rate of perceived exertion (RPE) scale Yes       Intervention Provide education and explanation on how to use RPE scale       Expected Outcomes Short Term: Able to use RPE daily in rehab to express subjective intensity level;Long Term:  Able to use RPE to guide intensity level when exercising independently       Able to understand and use Dyspnea scale Yes       Intervention Provide education and explanation on how to use Dyspnea scale       Expected Outcomes Short Term: Able to use Dyspnea scale daily in rehab to express subjective sense of shortness of breath during exertion;Long Term: Able to use Dyspnea scale to guide intensity level when exercising independently       Knowledge and understanding of Target Heart Rate Range (THRR) Yes       Intervention Provide education and explanation of THRR including how the numbers were predicted and where they are located for reference       Expected Outcomes Short Term: Able to state/look up THRR;Short Term: Able to use  daily as guideline for intensity in rehab;Long Term: Able to use THRR to govern intensity when exercising independently       Able to check pulse independently Yes       Intervention Provide education and demonstration on how to check pulse in carotid and radial arteries.;Review the importance of being able to check your own pulse for safety during independent exercise       Expected Outcomes Short Term: Able to explain why pulse checking is important  during independent exercise;Long Term: Able to check pulse independently and accurately       Understanding of Exercise Prescription Yes       Intervention Provide education, explanation, and written materials on patient's individual exercise prescription       Expected Outcomes Short Term: Able to explain program exercise prescription;Long Term: Able to explain home exercise prescription to exercise independently                Exercise Goals Re-Evaluation :  Exercise Goals Re-Evaluation     Row Name 04/04/21 0915 04/18/21 1255 05/01/21 1456 05/31/21 1401 06/06/21 1009     Exercise Goal Re-Evaluation   Exercise Goals Review Increase Physical Activity;Able to understand and use rate of perceived exertion (RPE) scale;Knowledge and understanding of Target Heart Rate Range (THRR);Understanding of Exercise Prescription;Increase Strength and Stamina;Able to understand and use Dyspnea scale;Able to check pulse independently Increase Physical Activity;Increase Strength and Stamina -- Increase Physical Activity;Increase Strength and Stamina;Understanding of Exercise Prescription Increase Physical Activity;Able to understand and use rate of perceived exertion (RPE) scale;Knowledge and understanding of Target Heart Rate Range (THRR);Understanding of Exercise Prescription;Increase Strength and Stamina;Able to understand and use Dyspnea scale;Able to check pulse independently   Comments Reviewed RPE and dyspnea scales, THR and program prescription with pt  today.  Pt voiced understanding and was given a copy of goals to take home. Zachary Guerrero has tolerated exercise well so far.  He is up to level 3 on T5. Staff will monitor progress. Out since last review.  Last attended on 04/17/21. Rand returned to rehab this week after eye surgery.  He was able to come back and pick things up again.  We will conitnue to monitor his progress. Reviewed home exercise with pt today.  Pt plans to walk for exercise.  Reviewed THR, pulse, RPE, sign and symptoms, pulse oximetery and when to call 911 or MD.  Also discussed weather considerations and indoor options.  Pt voiced understanding.   Expected Outcomes Short: Use RPE daily to regulate intensity. Long: Follow program prescription in THR. Short:  attend consistently Long: improve overall stamina -- Short: Return to regular attendance Long: conitnue to follow program precription Short: add 1-2 days a week walking outside of class. Long: become independent with exercise program.    Row Name 06/12/21 1241 06/20/21 0927 06/28/21 0911 07/12/21 1148 07/25/21 0939     Exercise Goal Re-Evaluation   Exercise Goals Review Increase Physical Activity;Increase Strength and Stamina;Understanding of Exercise Prescription Increase Physical Activity;Increase Strength and Stamina;Understanding of Exercise Prescription Increase Physical Activity;Increase Strength and Stamina;Understanding of Exercise Prescription Increase Physical Activity;Increase Strength and Stamina;Understanding of Exercise Prescription Increase Physical Activity;Increase Strength and Stamina;Understanding of Exercise Prescription   Comments Zachary Guerrero continues to attend since he's been out for eye surgery. His level on the T4 Nustep increased to level 5 and hand weights increased to 6 lbs. He has not increased his workload on the TM and would benefit from doing so. Will continue to monitor. Zachary Guerrero is doing well in rehab. Over weekend, he was able to go to his [place] properties to work on  some up keep.  This keeps him active and he is also walking on his off days. He is feeling like his stamina is improving. Zachary Guerrero is doing well in rehab. He increased his overall MET level to 2.7 METs. He also tolerated 6 lb hand weights for resistance training. H has started to get closer to reaching his THR zone. We will continue to monitor his  progress in the program. Zachary Guerrero continues to do well in rehab.  He reached a max of 30 laps so far on the track! He also increased to level 4 on the T5 Nustep. RPEs are in appropriate range. Will continue to monitor. Zachary Guerrero is scheduled for a cardioversion tomorrow and will need clearance to return.  He asked to hold off on his post 6MWT til after his cardioversion to be able to feel better.   Expected Outcomes Short: Increase speed on treadmill Long: Continue to increase overall MET level Short; Continue to walk on off days Long;Continue to improve stamina Short; Increase number of laps on track. Long;Continue to increase overall MET levels. Short: Continue to work up laps on track Long: Continue to build up overall strength and stamina Short: Clearance to return to rehab and improve post 6MWT Long: Continue to build stamina    Row Name 07/26/21 0745 08/09/21 1138 08/21/21 1611 08/22/21 0932       Exercise Goal Re-Evaluation   Exercise Goals Review Increase Physical Activity;Increase Strength and Stamina;Understanding of Exercise Prescription Increase Physical Activity;Increase Strength and Stamina;Understanding of Exercise Prescription Increase Physical Activity;Increase Strength and Stamina;Understanding of Exercise Prescription Increase Physical Activity;Increase Strength and Stamina;Understanding of Exercise Prescription    Comments Zachary Guerrero continues to do well in rehab. He recently increased to 7 lb hand weights for resistance exercise. He also increased his average overall MET level to 2.79 METs. Zachary Guerrero is also close to graduating and is scheduled to complete his  post 6MWT once he has his cardioversion and is cleared to return to rehab. We will continue to monitor his progress until he graduates. Zachary Guerrero returned this week after his ablation.  He said he feels like his heart is not racing as much, but he is still getting SOB.  He will be out the rest of the week for appointments, but we will do his walk test next week when he is back. We hope to see an improvement in his post 6MWT.  We will conitnue to monitor his progress. Zachary Guerrero is doing well in rehab.  He is nearing graduation .  He did not improve his post 6MWT but he is back up to 30 laps.  He is planning to continue to walk at home after graduation. [name] plans to use the [employer] center upon graduation from pulmonary rehab.    Expected Outcomes Short: Clearance to return to rehab and improve post 6MWT Long: Continue to build stamina Short: Improve post 6MWT Long: Continue to exercise independently Continue to exercise independently Continue to exercise independently             Discharge Exercise Prescription (Final Exercise Prescription Changes):  Exercise Prescription Changes - 08/21/21 1600       Response to Exercise   Blood Pressure (Admit) 108/64    Blood Pressure (Exit) 112/62    Heart Rate (Admit) 59 bpm    Heart Rate (Exercise) 98 bpm    Heart Rate (Exit) 79 bpm    Oxygen Saturation (Admit) 93 %    Oxygen Saturation (Exercise) 90 %    Oxygen Saturation (Exit) 93 %    Rating of Perceived Exertion (Exercise) 13    Perceived Dyspnea (Exercise) 3    Symptoms SOB    Duration Continue with 30 min of aerobic exercise without signs/symptoms of physical distress.    Intensity THRR unchanged      Progression   Progression Continue to progress workloads to maintain intensity without signs/symptoms of physical distress.  Average METs 3.21      Resistance Training   Training Prescription Yes    Weight 7 lb    Reps 10-15      Interval Training   Interval Training No      NuStep    Level 6    Minutes 15    METs 3.8      Track   Laps 30    Minutes 15    METs 2.63      Home Exercise Plan   Plans to continue exercise at Home (comment)   walk   Frequency Add 2 additional days to program exercise sessions.    Initial Home Exercises Provided 06/06/21      Oxygen   Maintain Oxygen Saturation 88% or higher             Nutrition:  Target Goals: Understanding of nutrition guidelines, daily intake of sodium '1500mg'$ , cholesterol '200mg'$ , calories 30% from fat and 7% or less from saturated fats, daily to have 5 or more servings of fruits and vegetables.  Education: All About Nutrition: -Group instruction provided by verbal, written material, interactive activities, discussions, models, and posters to present general guidelines for heart healthy nutrition including fat, fiber, MyPlate, the role of sodium in heart healthy nutrition, utilization of the nutrition label, and utilization of this knowledge for meal planning. Follow up email sent as well. Written material given at graduation. Flowsheet Row Pulmonary Rehab from 06/14/2021 in [facility] Card[facility] Pulmonary [facility]  Date 04/13/21  Educator [facility]  Instruction Review Code 1- Verbalizes Understanding       Biometrics:  Pre Biometrics - 04/03/21 1131       Pre Biometrics   Height 5' 11.25" (1.81 m)    Weight 266 lb 11.2 oz (121 kg)    BMI (Calculated) 36.93    Single Leg Stand 4.3 seconds             Post Biometrics - 08/15/21 1042        Post  Biometrics   Height 5' 11.25" (1.81 m)    Weight 273 lb 11.2 oz (124.1 kg)    BMI (Calculated) 37.9    Single Leg Stand 2.5 seconds             Nutrition Therapy Plan and Nutrition Goals:  Nutrition Therapy & Goals - 06/20/21 1015       Nutrition Therapy   Diet Heart healthy, low Na, T2DM MNT    Protein (specify units) 90-95g    Fiber 30 grams    Whole Grain Foods 3 servings    Saturated Fats 16 max. grams    Fruits and Vegetables 8 servings/day     Sodium 2 grams      Personal Nutrition Goals   Nutrition Goal ST: add nuts/seeds to cereal, plan meals using MyPlate guidelines, add in satitaing snacks that include fat, fiber, and protein like homemade tzatziki with vegetables and whole wheat pita chips or cottage cheese with fruit.  LT: prevent BG lows, follow MyPlate guidelines, honor hunger.    Comments 76 y.o. M admitted to pulmonary rehab for COPD. PMHx includes a fib., CAD, HLD, nephrolithiasis, reiter's disease, T2DM, barrett's esophagus. PSHx includes cholecystectomy. Relevant medications includes lantus, metformin, mag-ox, omeprazole, Semaglutide-Weight Management, quetiapine, glimepiride. PYP Score: 57. Vegetables & Fruits 6/12. Breads, Grains & Cereals 7/12. Red & Processed Meat 6/12. Poultry 2/2. Fish & Shellfish 4/4. Beans, Nuts & Seeds 3/4. Milk & Dairy Foods 6/6. Toppings, Oils, Seasonings & Salt 6/20.  Sweets, Snacks & Restaurant Food 9/14. Beverages 8/10. Drinks: Coffee with some cream and water. B: cereal (oatmeal and corn flakes with banana) or hard boiled eggs. He reports getting bored. L: salads (tomatoes, cucumber, onion, olives, boiled egg, radishes) and sandwiches - peanut butter and banana ( whole wheat bread), soup, leftovers, cheeseburger 1x/week, out to eat 1x/week. D: uses olive oil, pinto beans, corn, peaches, cottage cheese, hamburger, chicken mostly, will sometimes go out. He has weight cycled previously and most recently was trying noom to help with weight loss. Encouraged that heart healthy habits with help with his health outcomes even outside of weight loss. Discussed honoring hunger and limiting excess calories in the form of fats like oil and added sugar as these can add calories without necessarily adding satiation. Discussed heart healthy eating and MyPlate guidelines as well as T2DM MNT. Zachary Guerrero reports having low BG semi-frequesntly and usually when he will skip a meal - discussed the importance of eating consistent  CHO during the day and speaking with his MD regarding these lows as he is on Lantus. Discussed increase energy and protein needs with COPD.      Intervention Plan   Intervention Prescribe, educate and counsel regarding individualized specific dietary modifications aiming towards targeted core components such as weight, hypertension, lipid management, diabetes, heart failure and other comorbidities.;Nutrition handout(s) given to patient.    Expected Outcomes Short Term Goal: A plan has been developed with personal nutrition goals set during dietitian appointment.;Short Term Goal: Understand basic principles of dietary content, such as calories, fat, sodium, cholesterol and nutrients.;Long Term Goal: Adherence to prescribed nutrition plan.             Nutrition Assessments:  MEDIFICTS Score Key: ?70 Need to make dietary changes  40-70 Heart Healthy Diet ? 40 Therapeutic Level Cholesterol Diet  Flowsheet Row Pulmonary Rehab from 07/25/2021 in [facility] Cardiac and Pulmonary Rehab  Picture Your Plate Total Score on Discharge 51      Picture Your Plate Scores: <46 Unhealthy dietary pattern with much room for improvement. 41-50 Dietary pattern unlikely to meet recommendations for good health and room for improvement. 51-60 More healthful dietary pattern, with some room for improvement.  >60 Healthy dietary pattern, although there may be some specific behaviors that could be improved.   Nutrition Goals Re-Evaluation:  Nutrition Goals Re-Evaluation     [place] Name 06/06/21 [id number] 06/20/21 0930 07/25/21 0954 08/22/21 0937       Goals   Nutrition Goal -- Heart healthy eating plan Short: Try some new breakfast options and add nuts for protein Long: continue to balance meals with carbs/fats/proteins --    Comment Patient has appointment scheduled with dietician on 06/13/2021 Zachary Guerrero missed his appointment and is struggling with his diet.  He is trying to balance out his sugars to help him keeping from  getting too low or too high.  We talked about balancing carbs with proteins and fats to avoid the swings. We talked about breakfast options in particular. Zachary Guerrero is doing well in rehab.  He is eating a lot of good salads with the better selection of produce available.  He is also adding in more protein bolus to his cereal each day.  His sugars have been more balanced with the eatra protein.  He is feeling good about his diet plan currently. Zachary Guerrero feels like he has made many changes in his diet during the program to adapt to a heart healthy eating pattern.    Expected Outcome -- Short:  Try some new breakfast options and add nuts for protein Long: continue to balance meals with carbs/fats/proteins SHort: Continue to add in protein at breakfast.  Long; Continue to balance meals. Continue to indpendently work on Optometrist upon graduation.             Nutrition Goals Discharge (Final Nutrition Goals Re-Evaluation):  Nutrition Goals Re-Evaluation - 08/22/21 0937       Goals   Comment Zachary Guerrero feels like he has made many changes in his diet during the program to adapt to a heart healthy eating pattern.    Expected Outcome Continue to indpendently work on Optometrist upon graduation.             Psychosocial: Target Goals: Acknowledge presence or absence of significant depression and/or stress, maximize coping skills, provide positive support system. Participant is able to verbalize types and ability to use techniques and skills needed for reducing stress and depression.   Education: Stress, Anxiety, and Depression - Group verbal and visual presentation to define topics covered.  Reviews how body is impacted by stress, anxiety, and depression.  Also discusses healthy ways to reduce stress and to treat/manage anxiety and depression.  Written material given at graduation.   Education: Sleep Hygiene -Provides group verbal and written instruction about how sleep can affect your health.   Define sleep hygiene, discuss sleep cycles and impact of sleep habits. Review good sleep hygiene tips.    Initial Review & Psychosocial Screening:  Initial Psych Review & Screening - 03/20/21 1125       Initial Review   Current issues with Current Stress Concerns    Source of Stress Concerns Chronic Illness;Unable to participate in former interests or hobbies;Unable to perform yard/household activities    Comments Worries about getting older and slowing down, frustrating at times      [place]? Yes   wife, children (live in [place], 2 kids, 3 grandchildren)   Comments wants to be able to keep up with grandkids      Barriers   Psychosocial barriers to participate in program The patient should benefit from training in stress management and relaxation.;Psychosocial barriers identified (see note)      Screening Interventions   Interventions Encouraged to exercise;To provide support and resources with identified psychosocial needs;Provide feedback about the scores to participant    Expected Outcomes Short Term goal: Utilizing psychosocial counselor, staff and physician to assist with identification of specific Stressors or current issues interfering with healing process. Setting desired goal for each stressor or current issue identified.;Long Term Goal: Stressors or current issues are controlled or eliminated.;Short Term goal: Identification and review with participant of any Quality of Life or Depression concerns found by scoring the questionnaire.;Long Term goal: The participant improves quality of Life and PHQ9 Scores as seen by post scores and/or verbalization of changes             Quality of Life Scores:  Scores of 19 and below usually indicate a poorer quality of life in these areas.  A difference of  2-3 points is a clinically meaningful difference.  A difference of 2-3 points in the total score of the Quality of Life Index has been associated with  significant improvement in overall quality of life, self-image, physical symptoms, and general health in studies assessing change in quality of life.  PHQ-9: Review Flowsheet       07/25/2021 04/03/2021  Depression screen PHQ 2/9  Decreased Interest 1  0  Down, Depressed, Hopeless 1 0  PHQ - 2 Score 2 0  Altered sleeping 3 2  Tired, decreased energy 2 1  Change in appetite 2 1  Feeling bad or failure about yourself  2 1  Trouble concentrating 1 0  Moving slowly or fidgety/restless 1 -  Suicidal thoughts 0 0  PHQ-9 Score 13 5  Difficult doing work/chores Somewhat difficult Not difficult at all   Interpretation of Total Score  Total Score Depression Severity:  1-4 = Minimal depression, 5-9 = Mild depression, 10-14 = Moderate depression, 15-19 = Moderately severe depression, 20-27 = Severe depression   Psychosocial Evaluation and Intervention:  Psychosocial Evaluation - 03/20/21 1135       Psychosocial Evaluation & Interventions   Interventions Stress management education;Encouraged to exercise with the program and follow exercise prescription    Comments Zachary Guerrero is coming to pulmonoary rehab for his COPD.  He has a history of smoking but quit in 2012.  His wife and family are his biggest supporters.  He has two kids that live in [place] and 3 grandkids that he wants to be able to keep up with and being around for.  His biggest frustrations are not being to do what he needs to and wants to do.  He used to be able to hike for 3 miles with his wife each week, but no longer able to go that long.  He really wants to get his health better overall and have mroe energy.  He is also aiming to lose weight with a goal of 20 lbs.  He is also scheduled for cataract surgery in March and hoping that will help him be able to see better again.  He is very excited about the program and looking forward to working towards feeling better again.             Psychosocial Re-Evaluation:  Psychosocial  Re-Evaluation     [place] Name 06/06/21 [phone] 06/20/21 0929 07/25/21 0951 08/22/21 0935       Psychosocial Re-Evaluation   Current issues with Current Sleep Concerns Current Sleep Concerns;Current Stress Concerns Current Sleep Concerns;Current Stress Concerns Current Sleep Concerns;Current Stress Concerns    Comments Patient reports no new stress mental health concerns to reports. Zachary Guerrero is doing well in rehab. 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  He was down at beach cleaning up some over the weekend.  He is sleep is getting better and he is feeling better overall. Zachary Guerrero is not feeling the best today as his heart is out of rhythm.  He is scheduled for a cardioversion tomorrow.  His job is still his biggest stressor from his properties.  He has had a rough stress week with lots to do.  He has a lot coming at him right now from all around him.  He was feeling good last week, but today feeling hit hard.  He is hoping the cardioversion will help him feel better. Zachary Guerrero reports no new changes in mental health and sleep.    Expected Outcomes Short: continue to attend pulmonary rehab for mental health benefits. Long: Manintain good mental health and sleep habits. Short: Conitnue to exercise for mental boost Long: Continue to stay positive. Short: Get cardioversion and use PLB to help with breathing Long: COnitnue to focus on positive. upon discharge Zachary Guerrero will continue with positive mental health routine.    Interventions Encouraged to attend Pulmonary Rehabilitation for the exercise Encouraged to  attend Pulmonary Rehabilitation for the exercise Encouraged to attend Pulmonary Rehabilitation for the exercise --    Continue Psychosocial Services  Follow up required by staff Follow up required by staff -- --             Psychosocial Discharge (Final Psychosocial Re-Evaluation):  Psychosocial Re-Evaluation - 08/22/21 0935       Psychosocial Re-Evaluation   Current  issues with Current Sleep Concerns;Current Stress Concerns    Comments Zachary Guerrero reports no new changes in mental health and sleep.    Expected Outcomes upon discharge Zachary Guerrero will continue with positive mental health routine.             Education: Education Goals: Education classes will be provided on a weekly basis, covering required topics. Participant will state understanding/return demonstration of topics presented.  Learning Barriers/Preferences:  Learning Barriers/Preferences - 03/20/21 1124       Learning Barriers/Preferences   Learning Barriers Sight   needs cataract surgery (March)   Learning Preferences None             General Pulmonary Education Topics:  Infection Prevention: - Provides verbal and written material to individual with discussion of infection control including proper hand washing and proper equipment cleaning during exercise session. Flowsheet Row Pulmonary Rehab from 06/14/2021 in [facility] Cardiac and Pulmonary Rehab  Date 04/03/21  Educator AS  Instruction Review Code 1- Verbalizes Understanding       Falls Prevention: - Provides verbal and written material to individual with discussion of falls prevention and safety. Flowsheet Row Pulmonary Rehab from 06/14/2021 in [facility] Cardiac and Pulmonary Rehab  Date 04/03/21  Educator AS  Instruction Review Code 1- Verbalizes Understanding       Chronic Lung Disease Review: - Group verbal instruction with posters, models, PowerPoint presentations and videos,  to review new updates, new respiratory medications, new advancements in procedures and treatments. Providing information on websites and "800" numbers for continued self-education. Includes information about supplement oxygen, available portable oxygen systems, continuous and intermittent flow rates, oxygen safety, concentrators, and Medicare reimbursement for oxygen. Explanation of Pulmonary Drugs, including class, frequency, complications, importance of  spacers, rinsing mouth after steroid MDI's, and proper cleaning methods for nebulizers. Review of basic lung anatomy and physiology related to function, structure, and complications of lung disease. Review of risk factors. Discussion about methods for diagnosing sleep apnea and types of masks and machines for OSA. Includes a review of the use of types of environmental controls: home humidity, furnaces, filters, dust mite/pet prevention, HEPA vacuums. Discussion about weather changes, air quality and the benefits of nasal washing. Instruction on Warning signs, infection symptoms, calling MD promptly, preventive modes, and value of vaccinations. Review of effective airway clearance, coughing and/or vibration techniques. Emphasizing that all should Create an Action Plan. Written material given at graduation.   AED/CPR: - Group verbal and written instruction with the use of models to demonstrate the basic use of the AED with the basic ABC's of resuscitation.    Anatomy and Cardiac Procedures: - Group verbal and visual presentation and models provide information about basic cardiac anatomy and function. Reviews the testing methods done to diagnose heart disease and the outcomes of the test results. Describes the treatment choices: Medical Management, Angioplasty, or Coronary Bypass Surgery for treating various heart conditions including Myocardial Infarction, Angina, Valve Disease, and Cardiac Arrhythmias.  Written material given at graduation. Flowsheet Row Pulmonary Rehab from 06/14/2021 in [facility] Cardiac and Pulmonary Rehab  Date 04/06/21  Educator SB  Instruction Review Code 1- Verbalizes Understanding       Medication Safety: - Group verbal and visual instruction to review commonly prescribed medications for heart and lung disease. Reviews the medication, class of the drug, and side effects. Includes the steps to properly store meds and maintain the prescription regimen.  Written material given at  graduation.   Other: -Provides group and verbal instruction on various topics (see comments)   Knowledge Questionnaire Score:  Knowledge Questionnaire Score - 07/25/21 1040       Knowledge Questionnaire Score   Pre Score 16/18    Post Score 15/18              Core Components/Risk Factors/Patient Goals at Admission:  Personal Goals and Risk Factors at Admission - 04/03/21 1139       Core Components/Risk Factors/Patient Goals on Admission    Weight Management Yes;Obesity;Weight Loss    Intervention Weight Management: Develop a combined nutrition and exercise program designed to reach desired caloric intake, while maintaining appropriate intake of nutrient and fiber, sodium and fats, and appropriate energy expenditure required for the weight goal.;Weight Management: Provide education and appropriate resources to help participant work on and attain dietary goals.;Weight Management/Obesity: Establish reasonable short term and long term weight goals.;Obesity: Provide education and appropriate resources to help participant work on and attain dietary goals.    Admit Weight 266 lb 11.2 oz (121 kg)    Goal Weight: Short Term 255 lb (115.7 kg)    Goal Weight: Long Term 240 lb (108.9 kg)    Expected Outcomes Short Term: Continue to assess and modify interventions until short term weight is achieved;Long Term: Adherence to nutrition and physical activity/exercise program aimed toward attainment of established weight goal;Weight Loss: Understanding of general recommendations for a balanced deficit meal plan, which promotes 1-2 lb weight loss per week and includes a negative energy balance of [phone] kcal/d;Understanding recommendations for meals to include 15-35% energy as protein, 25-35% energy from fat, 35-60% energy from carbohydrates, less than 200mg  of dietary cholesterol, 20-35 gm of total fiber daily;Understanding of distribution of calorie intake throughout the day with the consumption of  4-5 meals/snacks    Improve shortness of breath with ADL's Yes    Intervention Provide education, individualized exercise plan and daily activity instruction to help decrease symptoms of SOB with activities of daily living.    Expected Outcomes Short Term: Improve cardiorespiratory fitness to achieve a reduction of symptoms when performing ADLs;Long Term: Be able to perform more ADLs without symptoms or delay the onset of symptoms    Increase knowledge of respiratory medications and ability to use respiratory devices properly  Yes    Intervention Provide education and demonstration as needed of appropriate use of medications, inhalers, and oxygen therapy.    Expected Outcomes Short Term: Achieves understanding of medications use. Understands that oxygen is a medication prescribed by physician. Demonstrates appropriate use of inhaler and oxygen therapy.;Long Term: Maintain appropriate use of medications, inhalers, and oxygen therapy.    Diabetes Yes    Intervention Provide education about signs/symptoms and action to take for hypo/hyperglycemia.;Provide education about proper nutrition, including hydration, and aerobic/resistive exercise prescription along with prescribed medications to achieve blood glucose in normal ranges: Fasting glucose 65-99 mg/dL    Expected Outcomes Short Term: Participant verbalizes understanding of the signs/symptoms and immediate care of hyper/hypoglycemia, proper foot care and importance of medication, aerobic/resistive exercise and nutrition plan for blood glucose control.;Long Term: Attainment of HbA1C < 7%.  Hypertension Yes    Intervention Provide education on lifestyle modifcations including regular physical activity/exercise, weight management, moderate sodium restriction and increased consumption of fresh fruit, vegetables, and low fat dairy, alcohol moderation, and smoking cessation.;Monitor prescription use compliance.    Expected Outcomes Short Term: Continued  assessment and intervention until BP is < 140/17mm HG in hypertensive participants. < 130/68mm HG in hypertensive participants with diabetes, heart failure or chronic kidney disease.;Long Term: Maintenance of blood pressure at goal levels.    Lipids Yes    Intervention Provide education and support for participant on nutrition & aerobic/resistive exercise along with prescribed medications to achieve LDL '70mg'$ , HDL >$Remo'40mg'aADCR$ .    Expected Outcomes Short Term: Participant states understanding of desired cholesterol values and is compliant with medications prescribed. Participant is following exercise prescription and nutrition guidelines.;Long Term: Cholesterol controlled with medications as prescribed, with individualized exercise RX and with personalized nutrition plan. Value goals: LDL < $Rem'70mg'LqXo$ , HDL > 40 mg.             Education:Diabetes - Individual verbal and written instruction to review signs/symptoms of diabetes, desired ranges of glucose level fasting, after meals and with exercise. Acknowledge that pre and post exercise glucose checks will be done for 3 sessions at entry of program. Flowsheet Row Pulmonary Rehab from 03/20/2021 in [facility] Cardiac and Pulmonary Rehab  Date 03/20/21  Educator [facility]  Instruction Review Code 1- Verbalizes Understanding       Know Your Numbers and Heart Failure: - Group verbal and visual instruction to discuss disease risk factors for cardiac and pulmonary disease and treatment options.  Reviews associated critical values for Overweight/Obesity, Hypertension, Cholesterol, and Diabetes.  Discusses basics of heart failure: signs/symptoms and treatments.  Introduces Heart Failure Zone chart for action plan for heart failure.  Written material given at graduation.   Core Components/Risk Factors/Patient Goals Review:   Goals and Risk Factor Review     Row Name 06/06/21 0959 06/20/21 0935 07/25/21 0935 08/22/21 6553       Core Components/Risk Factors/Patient Goals  Review   Personal Goals Review Weight Management/Obesity;Increase knowledge of respiratory medications and ability to use respiratory devices properly.;Lipids;Diabetes;Improve shortness of breath with ADL's;Hypertension;Develop more efficient breathing techniques such as purse lipped breathing and diaphragmatic breathing and practicing self-pacing with activity. Weight Management/Obesity;Increase knowledge of respiratory medications and ability to use respiratory devices properly.;Lipids;Diabetes;Improve shortness of breath with ADL's;Hypertension;Develop more efficient breathing techniques such as purse lipped breathing and diaphragmatic breathing and practicing self-pacing with activity. Weight Management/Obesity;Increase knowledge of respiratory medications and ability to use respiratory devices properly.;Lipids;Diabetes;Improve shortness of breath with ADL's;Hypertension Weight Management/Obesity;Increase knowledge of respiratory medications and ability to use respiratory devices properly.;Lipids;Diabetes;Improve shortness of breath with ADL's;Hypertension    Review Patient reports that his weight has been steady. He takes all meds as prescribed. Lipids, BP, and blood sugar are reported to be in acceptable ranges. He reports that he has seen some small improvements in SOB and is comfortable monitoring SAO2 levels at home and using PLB when SOB. Zachary Guerrero is having swings with his sugars and trying to find a balance.  We talked about some options to help balance meals.  He is disappointed that he is not losing weight with [place] for 5 months.  He was really hoping it would go down.  His breathing endurance is improving and he is able to do more at home.  His pressures have been good. He is doing well with resp meds. Zachary Guerrero is scheduled for a cardioversion tormorrow. He  is not feeling the best today and hope getting back into rhythm will help.  His breathing  was getting better up until going out of rhythm.  He is  good about using his meds and using his PLB to help.  His weight continues to go up but has not noticed any swelling for retaining fluid.  His pressures are doing well. He has come off his predisone on Friday and hopes his sugars will balance back out from being high. Zachary Guerrero reports that he is monitoring BP, blood glucose at home and is doing well taking all his medications. He continues to monitor and work on his weight loss goal.    Expected Outcomes Short: continue to take all meds as prescribed and continue to montior wt, BG, BS at home. Long: Continue to montior risk factors. Short: COntinue to work on balance for sugars Long: Continue to work on [employer] loss Short: Get clearance to return after cardioversion Long: Continue to monitor risk factors. Continue to control risk factors upon gradiation.             Core Components/Risk Factors/Patient Goals at Discharge (Final Review):   Goals and Risk Factor Review - 08/22/21 0938       Core Components/Risk Factors/Patient Goals Review   Personal Goals Review Weight Management/Obesity;Increase knowledge of respiratory medications and ability to use respiratory devices properly.;Lipids;Diabetes;Improve shortness of breath with ADL's;Hypertension    Review Zachary Guerrero reports that he is monitoring BP, blood glucose at home and is doing well taking all his medications. He continues to monitor and work on his weight loss goal.    Expected Outcomes Continue to control risk factors upon gradiation.             ITP Comments:  ITP Comments     Row Name 03/20/21 1148 04/03/21 1138 04/04/21 0915 04/19/21 0853 05/01/21 1455   ITP Comments Completed virtual orientation today.  EP evaluation is scheduled for Monday 3/6 at 9am.  Documentation for diagnosis can be found in [facility] encounter 01/03/21. Completed 6MWT and gym orientation. Initial ITP created and sent for review to Dr. Ottie Glazier, Medical Director. First full day of exercise!  Patient was oriented  to gym and equipment including functions, settings, policies, and procedures.  Patient's individual exercise prescription and treatment plan were reviewed.  All starting workloads were established based on the results of the 6 minute walk test done at initial orientation visit.  The plan for exercise progression was also introduced and progression will be customized based on patient's performance and goals. 30 Day review completed. Medical Director ITP review done, changes made as directed, and signed approval by Medical Director.   New to program Pt has been out since 04/17/21.  He had his first cataract surgery on 3/22 and second one scheduled on 4/4.    [employer] Name 05/11/21 1447 05/16/21 1232 05/17/21 1357 06/14/21 0906 06/20/21 1122   ITP Comments Patient has been out for cataract surgery dated from 4/4. He will return to rehab next Tuesday, 4/18. Zachary Guerrero has not yet returned after cataract surgery. 30 Day review completed. Medical Director ITP review done, changes made as directed, and signed approval by Medical Director. 30 Day review completed. Medical Director ITP review done, changes made as directed, and signed approval by Medical Director. Completed intial RD consultation    [place] Name 07/12/21 1330 08/09/21 1017 08/29/21 1036       ITP Comments 30 Day review completed. Medical Director ITP review done, changes made as  directed, and signed approval by Medical Director. 30 Day review completed. Medical Director ITP review done, changes made as directed, and signed approval by Medical Director. Zachary Guerrero graduated today from  rehab with 36 sessions completed.  Details of the patient's exercise prescription and what He needs to do in order to continue the prescription and progress were discussed with patient.  Patient was given a copy of prescription and goals.  Patient verbalized understanding.  [name] plans to continue to exercise by joining the [facility].              Comments: Discharge ITP

## 2021-08-29 NOTE — Progress Notes (Signed)
Discharge NOTE Zachary Guerrero   Aug 31, 1945  Zachary Guerrero graduated today from  rehab with 36 sessions completed.  Details of the patient's exercise prescription and what He needs to do in order to continue the prescription and progress were discussed with patient.  Patient was given a copy of prescription and goals.  Patient verbalized understanding.  Zachary Guerrero plans to continue to exercise by joining the [facility].   6 Minute Walk     Row Name 04/03/21 1117 08/15/21 1041       6 Minute Walk   Phase Initial Discharge    Distance 1450 feet 1400 feet    Distance % Change -- -3.4 %    Distance Feet Change -- -50 ft    Walk Time 6 minutes 6 minutes    # of Rest Breaks 0 0    MPH 2.75 2.65    METS 2.65 2.5    RPE 13 15    Perceived Dyspnea  3 3    VO2 Peak 9.27 8.75    Symptoms Yes (comment) Yes (comment)    Comments SOB SOB    Resting HR 68 bpm 57 bpm    Resting BP 138/60 106/64    Resting Oxygen Saturation  94 % 97 %    Exercise Oxygen Saturation  during 6 min walk 88 % 84 %    Max Ex. HR 103 bpm 99 bpm    Max Ex. BP 154/68 168/84    2 Minute Post BP 132/62 132/74      Interval HR   1 Minute HR 84 75    2 Minute HR 98 85    3 Minute HR 100 90    4 Minute HR 98 95    5 Minute HR 103 95    6 Minute HR 101 99    2 Minute Post HR 82 84    Interval Heart Rate? Yes Yes      Interval Oxygen   Interval Oxygen? Yes Yes    Baseline Oxygen Saturation % 94 % 97 %    1 Minute Oxygen Saturation % 91 % 91 %    1 Minute Liters of Oxygen 0 L 0 L  Room Air    2 Minute Oxygen Saturation % 92 % 88 %    2 Minute Liters of Oxygen 0 L 0 L    3 Minute Oxygen Saturation % 89 % 87 %    3 Minute Liters of Oxygen 0 L 0 L    4 Minute Oxygen Saturation % 88 % 86 %    4 Minute Liters of Oxygen 0 L 0 L    5 Minute Oxygen Saturation % 88 % 84 %    5 Minute Liters of Oxygen 0 L 0 L    6 Minute Oxygen Saturation % 88 % 84 %    6 Minute Liters of Oxygen 0 L 0 L    2 Minute Post Oxygen Saturation % 92 %  91 %    2 Minute Post Liters of Oxygen 0 L 0 L            Thank you for the referral.  We enjoyed working with Zachary Guerrero

## 2022-02-20 ENCOUNTER — Other Ambulatory Visit: Payer: Self-pay | Admitting: Surgery

## 2022-02-26 ENCOUNTER — Encounter
Admission: RE | Admit: 2022-02-26 | Discharge: 2022-02-26 | Disposition: A | Discharge status: Home / Self Care | Payer: Medicare PPO | Source: Ambulatory Visit | Attending: Surgery | Admitting: Surgery

## 2022-02-26 ENCOUNTER — Encounter: Payer: Self-pay | Admitting: Surgery

## 2022-02-26 ENCOUNTER — Other Ambulatory Visit: Payer: Self-pay

## 2022-02-26 DIAGNOSIS — Z794 Long term (current) use of insulin: Secondary | ICD-10-CM

## 2022-02-26 NOTE — Patient Instructions (Signed)
Your procedure is scheduled on: 03/07/22 Report to [place]. To find out your arrival time please call [phone] between 1PM - 3PM on 03/06/22.  Remember: Instructions that are not followed completely may result in serious medical risk, up to and including death, or upon the discretion of your surgeon and anesthesiologist your surgery may need to be rescheduled.     _X__ 1. Do not eat food after midnight the night before your procedure.                 No gum chewing or hard candies. You may drink clear liquids up to 2 hours                 before you are scheduled to arrive for your surgery- DO not drink clear                 liquids within 2 hours of the start of your surgery.                 Clear Liquids include: [employer] Diab[employer] water only  __X__2.  On the morning of surgery brush your teeth with toothpaste and water, you                 may rinse your mouth with mouthwash if you wish.  Do not swallow any              toothpaste of mouthwash.     _X__ 3.  No Alcohol for 24 hours before or after surgery.   _X__ 4.  Do Not Smoke or use e-cigarettes For 24 Hours Prior to Your Surgery.                 Do not use any chewable tobacco products for at least 6 hours prior to                 surgery.  ____  5.  Bring all medications with you on the day of surgery if instructed.   __X__  6.  Notify your doctor if there is any change in your medical condition      (cold, fever, infections).     Do not wear jewelry, make-up, hairpins, clips or nail polish. Do not wear lotions, powders, or perfumes. You may wear deodorant Do not shave body hair 48 hours prior to surgery. Men may shave face and neck. Do not bring valuables to the hospital.    [facility] is not responsible for any belongings or valuables.  Contacts, dentures/partials or body piercings may not be worn into surgery. Bring a case for your contacts, glasses or hearing aids, a  denture cup will be supplied. Leave your suitcase in the car. After surgery it may be brought to your room. For patients admitted to the hospital, discharge time is determined by your treatment team.   Patients discharged the day of surgery will need an adult driver. You will not be allowed to drive yourself home, take an uber, lyft or taxi. You may use medical transportation such as [place] as long as you have an adult with you or waiting to assist you upon your arrival home. You must have an adult over 22 years old in the home with you for the first 24 hours after surgery    __X__ Take these medicines the morning of surgery with A SIP OF WATER:    1. amiodarone (PACERONE) 200  MG tablet   2. gabapentin (NEURONTIN) 300 MG capsule   3. lovastatin (MEVACOR) 40 MG tablet   4. metoprolol succinate (TOPROL-XL) 25 MG 24 hr tablet   5. omeprazole (PRILOSEC) 20 MG capsule (yes, take at bedtime and again the morning of surgery)  6.  ____ Fleet Enema (as directed)   ____ Use CHG Soap/SAGE wipes as directed  __X__ Use inhalers on the day of surgery  __X__ Stop metformin/Janumet/Farxiga 2 days prior to surgery and Stop Rybelsus 1 day before surgery   ____ Take 1/2 of usual insulin dose the night before surgery. No insulin the morning          of surgery.   ____ Stop Blood Thinners Coumadin/Plavix/Xarelto/Pleta/Pradaxa/Eliquis/Effient/Aspirin  on   Or contact your Surgeon, Cardiologist or Medical Doctor regarding  ability to stop your blood thinners  __X__ Stop Anti-inflammatories 7 days before surgery such as Advil, Ibuprofen, Motrin,  BC or Goodies Powder, Naprosyn, Naproxen, Aleve, Aspirin, Meloxicam  You may substitute Tylenol.   __X__ Stop all herbals and supplements, fish oil or vitamins  until after surgery.    __X__ Bring C-Pap to the hospital.

## 2022-03-07 ENCOUNTER — Ambulatory Visit
Admission: RE | Admit: 2022-03-07 | Discharge: 2022-03-07 | Disposition: A | Discharge status: Home / Self Care | Payer: Medicare PPO | Attending: Surgery | Admitting: Surgery

## 2022-03-07 ENCOUNTER — Ambulatory Visit: Payer: Medicare PPO | Admitting: Certified Registered"

## 2022-03-07 ENCOUNTER — Encounter: Payer: Self-pay | Admitting: Surgery

## 2022-03-07 ENCOUNTER — Ambulatory Visit: Payer: Medicare PPO | Admitting: Urgent Care

## 2022-03-07 ENCOUNTER — Encounter
Admission: RE | Disposition: A | Discharge status: Home / Self Care | Payer: Self-pay | Source: Home / Self Care | Attending: Surgery

## 2022-03-07 ENCOUNTER — Other Ambulatory Visit: Payer: Self-pay

## 2022-03-07 DIAGNOSIS — J449 Chronic obstructive pulmonary disease, unspecified: Secondary | ICD-10-CM | POA: Diagnosis not present

## 2022-03-07 DIAGNOSIS — Z87891 Personal history of nicotine dependence: Secondary | ICD-10-CM | POA: Diagnosis not present

## 2022-03-07 DIAGNOSIS — Z7901 Long term (current) use of anticoagulants: Secondary | ICD-10-CM | POA: Diagnosis not present

## 2022-03-07 DIAGNOSIS — I2510 Atherosclerotic heart disease of native coronary artery without angina pectoris: Secondary | ICD-10-CM | POA: Insufficient documentation

## 2022-03-07 DIAGNOSIS — E7800 Pure hypercholesterolemia, unspecified: Secondary | ICD-10-CM | POA: Diagnosis not present

## 2022-03-07 DIAGNOSIS — R0602 Shortness of breath: Secondary | ICD-10-CM | POA: Insufficient documentation

## 2022-03-07 DIAGNOSIS — I10 Essential (primary) hypertension: Secondary | ICD-10-CM | POA: Diagnosis not present

## 2022-03-07 DIAGNOSIS — M720 Palmar fascial fibromatosis [Dupuytren]: Secondary | ICD-10-CM | POA: Insufficient documentation

## 2022-03-07 DIAGNOSIS — E119 Type 2 diabetes mellitus without complications: Secondary | ICD-10-CM

## 2022-03-07 DIAGNOSIS — I4891 Unspecified atrial fibrillation: Secondary | ICD-10-CM | POA: Insufficient documentation

## 2022-03-07 DIAGNOSIS — Z7984 Long term (current) use of oral hypoglycemic drugs: Secondary | ICD-10-CM | POA: Insufficient documentation

## 2022-03-07 DIAGNOSIS — G4730 Sleep apnea, unspecified: Secondary | ICD-10-CM | POA: Diagnosis not present

## 2022-03-07 DIAGNOSIS — M0230 Reiter's disease, unspecified site: Secondary | ICD-10-CM | POA: Insufficient documentation

## 2022-03-07 DIAGNOSIS — E118 Type 2 diabetes mellitus with unspecified complications: Secondary | ICD-10-CM | POA: Diagnosis not present

## 2022-03-07 HISTORY — PX: DUPUYTREN CONTRACTURE RELEASE: SHX1478

## 2022-03-07 HISTORY — DX: Gastro-esophageal reflux disease without esophagitis: K21.9

## 2022-03-07 HISTORY — DX: Personal history of urinary calculi: Z87.442

## 2022-03-07 HISTORY — DX: Restless legs syndrome: G25.81

## 2022-03-07 LAB — GLUCOSE, CAPILLARY
Glucose-Capillary: 153 mg/dL — ABNORMAL HIGH (ref 70–99)
Glucose-Capillary: 65 mg/dL — ABNORMAL LOW (ref 70–99)
Glucose-Capillary: 75 mg/dL (ref 70–99)

## 2022-03-07 SURGERY — RELEASE, DUPUYTREN CONTRACTURE
Anesthesia: General | Site: Little Finger | Laterality: Right

## 2022-03-07 MED ORDER — LIDOCAINE HCL (CARDIAC) PF 100 MG/5ML IV SOSY
PREFILLED_SYRINGE | INTRAVENOUS | Status: DC | PRN
  Administered 2022-03-07: 100 mg via INTRAVENOUS

## 2022-03-07 MED ORDER — ACETAMINOPHEN 10 MG/ML IV SOLN
INTRAVENOUS | Status: AC
  Filled 2022-03-07: qty 100

## 2022-03-07 MED ORDER — HYDROCODONE-ACETAMINOPHEN 5-325 MG PO TABS: 1.0000 | ORAL_TABLET | ORAL | Status: DC | PRN

## 2022-03-07 MED ORDER — ONDANSETRON HCL 4 MG PO TABS: 4.0000 mg | ORAL_TABLET | Freq: Four times a day (QID) | ORAL | Status: DC | PRN

## 2022-03-07 MED ORDER — PROPOFOL 10 MG/ML IV BOLUS
INTRAVENOUS | Status: DC | PRN
  Administered 2022-03-07: 150 mg via INTRAVENOUS
  Administered 2022-03-07: 100 mg via INTRAVENOUS

## 2022-03-07 MED ORDER — PROPOFOL 10 MG/ML IV BOLUS
INTRAVENOUS | Status: AC
  Filled 2022-03-07: qty 40

## 2022-03-07 MED ORDER — PHENYLEPHRINE HCL (PRESSORS) 10 MG/ML IV SOLN
INTRAVENOUS | Status: DC | PRN
  Administered 2022-03-07: 160 ug via INTRAVENOUS
  Administered 2022-03-07: 80 ug via INTRAVENOUS
  Administered 2022-03-07: 160 ug via INTRAVENOUS
  Administered 2022-03-07 (×2): 80 ug via INTRAVENOUS

## 2022-03-07 MED ORDER — CEFAZOLIN SODIUM-DEXTROSE 2-4 GM/100ML-% IV SOLN
INTRAVENOUS | Status: AC
  Filled 2022-03-07: qty 100

## 2022-03-07 MED ORDER — ORAL CARE MOUTH RINSE: 15.0000 mL | Freq: Once | OROMUCOSAL | Status: AC

## 2022-03-07 MED ORDER — ONDANSETRON HCL 4 MG/2ML IJ SOLN: 4.0000 mg | Freq: Four times a day (QID) | INTRAMUSCULAR | Status: DC | PRN

## 2022-03-07 MED ORDER — ACETAMINOPHEN 10 MG/ML IV SOLN
INTRAVENOUS | Status: DC | PRN
  Administered 2022-03-07: 1000 mg via INTRAVENOUS

## 2022-03-07 MED ORDER — CHLORHEXIDINE GLUCONATE 0.12 % MT SOLN: 15.0000 mL | Freq: Once | OROMUCOSAL | Status: AC

## 2022-03-07 MED ORDER — SODIUM CHLORIDE 0.9 % IV SOLN: INTRAVENOUS | Status: DC

## 2022-03-07 MED ORDER — FENTANYL CITRATE (PF) 100 MCG/2ML IJ SOLN: 25.0000 ug | INTRAMUSCULAR | Status: DC | PRN

## 2022-03-07 MED ORDER — SUCCINYLCHOLINE CHLORIDE 200 MG/10ML IV SOSY
PREFILLED_SYRINGE | INTRAVENOUS | Status: DC | PRN
  Administered 2022-03-07: 200 mg via INTRAVENOUS

## 2022-03-07 MED ORDER — EPHEDRINE SULFATE (PRESSORS) 50 MG/ML IJ SOLN
INTRAMUSCULAR | Status: DC | PRN
  Administered 2022-03-07 (×2): 5 mg via INTRAVENOUS

## 2022-03-07 MED ORDER — ONDANSETRON HCL 4 MG/2ML IJ SOLN
INTRAMUSCULAR | Status: DC | PRN
  Administered 2022-03-07: 4 mg via INTRAVENOUS

## 2022-03-07 MED ORDER — ONDANSETRON HCL 4 MG/2ML IJ SOLN: 4.0000 mg | Freq: Once | INTRAMUSCULAR | Status: DC | PRN

## 2022-03-07 MED ORDER — METOCLOPRAMIDE HCL 10 MG PO TABS: 5.0000 mg | ORAL_TABLET | Freq: Three times a day (TID) | ORAL | Status: DC | PRN

## 2022-03-07 MED ORDER — METOCLOPRAMIDE HCL 5 MG/ML IJ SOLN: 5.0000 mg | Freq: Three times a day (TID) | INTRAMUSCULAR | Status: DC | PRN

## 2022-03-07 MED ORDER — CEFAZOLIN SODIUM-DEXTROSE 2-4 GM/100ML-% IV SOLN
2.0000 g | INTRAVENOUS | Status: AC
  Administered 2022-03-07: 2 g via INTRAVENOUS

## 2022-03-07 MED ORDER — FENTANYL CITRATE (PF) 100 MCG/2ML IJ SOLN
INTRAMUSCULAR | Status: AC
  Filled 2022-03-07: qty 2

## 2022-03-07 MED ORDER — FENTANYL CITRATE (PF) 100 MCG/2ML IJ SOLN
INTRAMUSCULAR | Status: DC | PRN
  Administered 2022-03-07 (×2): 50 ug via INTRAVENOUS

## 2022-03-07 MED ORDER — 0.9 % SODIUM CHLORIDE (POUR BTL) OPTIME
TOPICAL | Status: DC | PRN
  Administered 2022-03-07: 500 mL

## 2022-03-07 MED ORDER — BUPIVACAINE HCL (PF) 0.5 % IJ SOLN
INTRAMUSCULAR | Status: DC | PRN
  Administered 2022-03-07: 10 mL

## 2022-03-07 MED ORDER — CHLORHEXIDINE GLUCONATE 0.12 % MT SOLN
OROMUCOSAL | Status: AC
  Administered 2022-03-07: 15 mL via OROMUCOSAL
  Filled 2022-03-07: qty 15

## 2022-03-07 MED ORDER — ROCURONIUM BROMIDE 100 MG/10ML IV SOLN
INTRAVENOUS | Status: DC | PRN
  Administered 2022-03-07: 50 mg via INTRAVENOUS

## 2022-03-07 MED ORDER — BUPIVACAINE HCL (PF) 0.5 % IJ SOLN
INTRAMUSCULAR | Status: AC
  Filled 2022-03-07: qty 30

## 2022-03-07 MED ORDER — HYDROCODONE-ACETAMINOPHEN 5-325 MG PO TABS: 1.0000 | ORAL_TABLET | Freq: Four times a day (QID) | ORAL | 0 refills | Status: DC | PRN

## 2022-03-07 SURGICAL SUPPLY — 42 items
APL PRP STRL LF DISP 70% ISPRP (MISCELLANEOUS) ×1
BNDG CMPR 5X4 CHSV STRCH STRL (GAUZE/BANDAGES/DRESSINGS) ×1
BNDG CMPR 75X21 PLY HI ABS (MISCELLANEOUS)
BNDG COHESIVE 4X5 TAN STRL LF (GAUZE/BANDAGES/DRESSINGS) ×2 IMPLANT
BNDG ELASTIC 2X5.8 VLCR STR LF (GAUZE/BANDAGES/DRESSINGS) ×2 IMPLANT
BNDG ELASTIC 3X5.8 VLCR STR LF (GAUZE/BANDAGES/DRESSINGS) ×2 IMPLANT
BNDG ESMARCH 4 X 12 STRL LF (GAUZE/BANDAGES/DRESSINGS) ×1
BNDG ESMARCH 4X12 STRL LF (GAUZE/BANDAGES/DRESSINGS) ×2 IMPLANT
CHLORAPREP W/TINT 26 (MISCELLANEOUS) ×2 IMPLANT
CORD BIP STRL DISP 12FT (MISCELLANEOUS) ×2 IMPLANT
CUFF TOURN SGL QUICK 18X4 (TOURNIQUET CUFF) ×2 IMPLANT
DRAPE SURG 17X11 SM STRL (DRAPES) ×2 IMPLANT
DRSG GAUZE FLUFF 36X18 (GAUZE/BANDAGES/DRESSINGS) IMPLANT
ELECT REM PT RETURN 9FT ADLT (ELECTROSURGICAL) ×1
ELECTRODE REM PT RTRN 9FT ADLT (ELECTROSURGICAL) ×2 IMPLANT
FORCEPS JEWEL BIP 4-3/4 STR (INSTRUMENTS) ×2 IMPLANT
GAUZE SPONGE 4X4 12PLY STRL (GAUZE/BANDAGES/DRESSINGS) ×2 IMPLANT
GAUZE SPONGE 4X4 16PLY XRAY LF (GAUZE/BANDAGES/DRESSINGS) IMPLANT
GAUZE STRETCH 2X75IN STRL (MISCELLANEOUS) ×2 IMPLANT
GAUZE XEROFORM 1X8 LF (GAUZE/BANDAGES/DRESSINGS) ×2 IMPLANT
GLOVE BIO SURGEON STRL SZ8 (GLOVE) ×4 IMPLANT
GLOVE SURG UNDER LTX SZ8 (GLOVE) ×2 IMPLANT
GOWN STRL REUS W/ TWL LRG LVL3 (GOWN DISPOSABLE) ×2 IMPLANT
GOWN STRL REUS W/ TWL XL LVL3 (GOWN DISPOSABLE) ×2 IMPLANT
GOWN STRL REUS W/TWL LRG LVL3 (GOWN DISPOSABLE) ×1
GOWN STRL REUS W/TWL XL LVL3 (GOWN DISPOSABLE) ×1
KIT TURNOVER KIT A (KITS) ×2 IMPLANT
MANIFOLD NEPTUNE II (INSTRUMENTS) ×2 IMPLANT
NS IRRIG 1000ML POUR BTL (IV SOLUTION) ×2 IMPLANT
NS IRRIG 500ML POUR BTL (IV SOLUTION) ×2 IMPLANT
PACK EXTREMITY ARMC (MISCELLANEOUS) ×2 IMPLANT
PAD CAST 3X4 CTTN HI CHSV (CAST SUPPLIES) IMPLANT
PAD PREP 24X41 OB/GYN DISP (PERSONAL CARE ITEMS) ×2 IMPLANT
PADDING CAST COTTON 3X4 STRL (CAST SUPPLIES) ×1
SPLINT CAST 1 STEP 3X12 (MISCELLANEOUS) IMPLANT
SPONGE GAUZE 2X2 8PLY STRL LF (GAUZE/BANDAGES/DRESSINGS) ×2 IMPLANT
STOCKINETTE IMPERVIOUS 9X36 MD (GAUZE/BANDAGES/DRESSINGS) ×2 IMPLANT
SUT PROLENE 4 0 PS 2 18 (SUTURE) ×2 IMPLANT
SUT VIC AB 3-0 SH 27 (SUTURE) ×1
SUT VIC AB 3-0 SH 27X BRD (SUTURE) ×2 IMPLANT
TRAP FLUID SMOKE EVACUATOR (MISCELLANEOUS) ×2 IMPLANT
WATER STERILE IRR 500ML POUR (IV SOLUTION) ×2 IMPLANT

## 2022-03-07 NOTE — Anesthesia Procedure Notes (Signed)
Procedure Name: Intubation Date/Time: 03/07/2022 4:35 PM  Performed by: Aline Brochure, CRNAPre-anesthesia Checklist: Patient identified, Patient being monitored, Timeout performed, Emergency Drugs available and Suction available Patient Re-evaluated:Patient Re-evaluated prior to induction Oxygen Delivery Method: Circle system utilized Preoxygenation: Pre-oxygenation with 100% oxygen Induction Type: IV induction and Rapid sequence Ventilation: Mask ventilation without difficulty Laryngoscope Size: 4 and McGraph Grade View: Grade I Tube type: Oral Tube size: 7.5 mm Number of attempts: 1 Airway Equipment and Method: Stylet and Video-laryngoscopy Placement Confirmation: ETT inserted through vocal cords under direct vision, positive ETCO2 and breath sounds checked- equal and bilateral Secured at: 24 cm Tube secured with: Tape Dental Injury: Teeth and Oropharynx as per pre-operative assessment

## 2022-03-07 NOTE — Transfer of Care (Signed)
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  Last Pain:  Vitals:   03/07/22 1756  TempSrc:   PainSc: 0-No pain         Complications: No notable events documented.

## 2022-03-07 NOTE — Anesthesia Preprocedure Evaluation (Signed)
Anesthesia Evaluation  Patient identified by MRN, date of birth, ID band Patient awake    Reviewed: Allergy & Precautions, NPO status , Patient's Chart, lab work & pertinent test results  History of Anesthesia Complications Negative for: history of anesthetic complications  Airway Mallampati: III  TM Distance: <3 FB Neck ROM: full    Dental  (+) Chipped, Poor Dentition, Missing, Partial Upper, Dental Advidsory Given   Pulmonary shortness of breath and with exertion, sleep apnea and Continuous Positive Airway Pressure Ventilation , pneumonia, COPD, neg recent URI, former smoker    + decreased breath sounds      Cardiovascular hypertension, (-) angina + CAD  (-) Past MI and (-) Cardiac Stents + dysrhythmias Atrial Fibrillation  Rhythm:irregular Rate:Normal     Neuro/Psych negative neurological ROS  negative psych ROS   GI/Hepatic negative GI ROS, Neg liver ROS,neg GERD  ,,  Endo/Other  diabetes, Type 2    Renal/GU Renal disease  negative genitourinary   Musculoskeletal   Abdominal   Peds  Hematology negative hematology ROS (+)   Anesthesia Other Findings Past Medical History: 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 No date: Chronic kidney disease     Comment:  KIDNEY STONES No date: COPD (chronic obstructive pulmonary disease) (HCC) No date: Coronary artery disease No date: Diabetes mellitus without complication (HCC) No date: Dysrhythmia     Comment:  A-FIB/DR KOWALSKI No date: Hypercholesteremia No date: Hypertension     Comment:  CONTROLLED ON MEDS No date: Pneumonia     Comment:  IN PAST 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 No date: Shortness of breath dyspnea     Comment:  WITH EXERTION No date: Sleep apnea     Comment:  C-PAP No date: Wears dentures     Comment:  UPPERS  Past Surgical History: No date: ARM SURGERY     Comment:  FRACTURED 01/04/2015: BROW LIFT; Bilateral     Comment:  Procedure:  BLEPHAROPLASTY;  Surgeon: Karle Starch, MD;                Location: [place];  Service: Ophthalmology;                Laterality: Bilateral;  DIABETIC-INSULIN AND ORAL               MEDS/CPAP No date: CARDIAC CATHETERIZATION No date: CARDIAC SURGERY 04/18/2021: CATARACT EXTRACTION W/PHACO; Left     Comment:  Procedure: CATARACT EXTRACTION PHACO AND INTRAOCULAR               LENS PLACEMENT (IOC) LEFT DIABETIC 16.56 01:30.8;                Surgeon: Birder Robson, MD;  Location: [place];  Service: Ophthalmology;  Laterality: Left;                Diabetic 05/02/2021: CATARACT EXTRACTION W/PHACO; Right     Comment:  Procedure: CATARACT EXTRACTION PHACO AND INTRAOCULAR               LENS PLACEMENT (IOC) RIGHT DIABETIC;  Surgeon: Birder Robson, MD;  Location: [place];  Service:               Ophthalmology;  Laterality: Right;  6.98 0:55.9 No date: CHOLECYSTECTOMY 12/26/2016: COLONOSCOPY WITH PROPOFOL; N/A  Comment:  Procedure: COLONOSCOPY WITH PROPOFOL;  Surgeon: [place],               Benay Pike, MD;  Location: [facility];  Service:               Gastroenterology;  Laterality: N/A; No date: CORONARY ARTERY BYPASS GRAFT     Comment:  X4 01/04/2015: ECTROPION REPAIR; Bilateral     Comment:  Procedure: REPAIR OF ECTROPION EXTENSIVE;  Surgeon: Karle Starch, MD;  Location: [place];  Service:               Ophthalmology;  Laterality: Bilateral; 12/26/2016: ESOPHAGOGASTRODUODENOSCOPY (EGD) WITH PROPOFOL; N/A     Comment:  Procedure: ESOPHAGOGASTRODUODENOSCOPY (EGD) WITH               PROPOFOL;  Surgeon: Toledo, Benay Pike, MD;  Location:               [facility];  Service: Gastroenterology;  Laterality:               N/A; 03/30/2020: ESOPHAGOGASTRODUODENOSCOPY (EGD) WITH PROPOFOL; N/A     Comment:  Procedure: ESOPHAGOGASTRODUODENOSCOPY (EGD) WITH               PROPOFOL;  Surgeon: Toledo, Benay Pike, MD;  Location:               [facility];  Service: Gastroenterology;  Laterality:               N/A; No date: HERNIA REPAIR 01/04/2015: PTOSIS REPAIR; Bilateral     Comment:  Procedure: PTOSIS REPAIR;  Surgeon: Karle Starch, MD;                Location: [place];  Service: Ophthalmology;                Laterality: Bilateral; No date: TOE SURGERY  BMI    Body Mass Index: 37.66 kg/m      Reproductive/Obstetrics negative OB ROS                             Anesthesia Physical Anesthesia Plan  ASA: 3  Anesthesia Plan: General   Post-op Pain Management:    Induction: Intravenous  PONV Risk Score and Plan: 2 and Propofol infusion and TIVA  Airway Management Planned: Natural Airway, Nasal Cannula and LMA  Additional Equipment:   Intra-op Plan:   Post-operative Plan: Extubation in OR  Informed Consent: I have reviewed the patients History and Physical, chart, labs and discussed the procedure including the risks, benefits and alternatives for the proposed anesthesia with the patient or authorized representative who has indicated his/her understanding and acceptance.     Dental Advisory Given  Plan Discussed with: Anesthesiologist, CRNA and Surgeon  Anesthesia Plan Comments: (Patient consented for risk of leaving partial in and opted to proceed with his partial in for the procedure.  Patient consented for risks of anesthesia including but not limited to:  - adverse reactions to medications - risk of airway placement if required - damage to eyes, teeth, lips or other oral mucosa - nerve damage due to positioning  - sore throat or hoarseness - Damage to heart, brain, nerves, lungs, other parts of body or loss of life  Patient voiced understanding.)        Anesthesia Quick Evaluation

## 2022-03-07 NOTE — Op Note (Signed)
03/07/2022  5:53 PM  Patient:   Zachary Guerrero  Pre-Op Diagnosis:   Dupuytren's contracture, right little finger.  Post-Op Diagnosis:   Same.  Procedure:   Release of Dupuytren's contracture, right little finger.  Surgeon:   Pascal Lux, MD  Assistant:   None  Anesthesia:   GET  Findings:   As above.  Complications:   None  EBL:   0 cc  Fluids:   1000 cc crystalloid  TT:   54 minutes at 250 mmHg  Drains:   None  Closure:   4-0 Prolene interrupted sutures  Brief Clinical Note:   The patient is a 77 year old male with a history of a gradually worsening flexion contracture of the right little finger. The patient's history and examination are consistent with a Dupuytren's contracture of the right little finger. The patient presents at this time for release of the Dupuytren's contracture of the right little finger.  Procedure:   The patient was brought into the operating room and lain in the supine position. After adequate general endotracheal intubation and anesthesia was achieved, the right hand and upper extremity were prepped with ChloraPrep solution before being draped sterilely. Preoperative antibiotics were administered. A timeout was performed to verify the appropriate surgical site before the limb was exsanguinated with an Esmarch and the tourniquet inflated to 250 mmHg.    A Brunner type zigzag incision was made along the volar aspect of the right little finger beginning just proximal to the proximal palmar crease and extending to the PIP flexion crease. The incision was carried down through subcutaneous tissues. The fibrous cord was identified and carefully dissected out from proximal to distal after releasing it proximally. As dissection was carried out, care was taken to identify and protect the common digital nerve and artery on either side of the cord, as well as the underlying flexor tendon, proximally. More distally, the digital neurovascular bundles were  identified and protected. After the mass was removed in its entirety, the adequacy of excision was verified by palpation as well as visually. After excision of the Dupuytren's tissue, the right little finger MCP and PIP joints could be extended fully.  The wound was copiously irrigated with sterile saline solution before the skin was reapproximated using 4-0 Prolene interrupted sutures. A total of 10 cc of 0.5% plain Sensorcaine was injected in and around the incision to help with postoperative analgesia before a sterile bulky dressing and volar splint extending to the fingertips was applied, maintaining the MCP joints in extension. The patient was then awakened, extubated, and returned to the recovery room in satisfactory condition after tolerating the procedure well.

## 2022-03-07 NOTE — H&P (Signed)
History of Present Illness: Zachary Guerrero is a 77 y.o. who presents today for a history and physical. He is to undergo Dupuytren contracture release of the fifth finger of right hand on 03/07/2022. Since his last visit here in the clinic he has not seen any change in his condition. Patient expresses his desire to continue with surgery.  Patient has been expressing contraction of the fifth finger of right hand for greater than 4 years. Initially was seen by Dr. Rudene Christians in 2019. Patient presents now stating that the contraction is getting worse to the point is having difficulty using the finger. Denies any numbness, tingling or burning sensation.  Past Medical History: Atrial fibrillation (CMS-HCC)  BPH (benign prostatic hyperplasia)  CAD (coronary artery disease)  STATUS POST CABG  Compression fracture of C-spine (CMS-HCC)  S/P C-SPINE TRAMA  COPD (chronic obstructive pulmonary disease) (CMS-HCC)  TOBACCO ABUSE  COVID-19 12/2018  FH: colonic polyps  Hyperlipidemia  Impotence  Nephrolithiasis  Obesity  Reiter's disease (CMS-HCC)   Past Surgical History EGD 11/12/2011 (Barrett's Esophagus: CBF 10/2013)  CHOLECYSTECTOMY 01/28/2014  COLONOSCOPY 12/26/2016 (Normal colon/PHx CP/Repeat 11yrs/TKT)  EGD 12/26/2016 (Barrett's Esophagus/Repeat 28yrs/TKT)  EGD 03/30/2020 (Barrett's Esophagus/Repeat 35yrs/TKT)  COLONOSCOPY [id number], 06/03/2006 (Large Hyperplastic Polyps: CF 10/2016)  FRACTURE SURGERY  S/P C-SPINE TRAMA  S/P INGUINAL REPAIR (Bilateral) RIGHT ARM Right  RIGHT ARM TRAMA WITH SURICAL REPAIR   Past Family History:  Emphysema Mother  COPD Mother  Emphysema Father  COPD Father   Medications: AMIOdarone (PACERONE) 200 MG tablet Take 1 tablet (200 mg total) by mouth once daily 90 tablet 4  BD INSULIN SYRINGE ULTRA-FINE 0.5 mL 31 gauge x 5/16" syringe once daily 100 each 1  BD INSULIN SYRINGE ULTRA-FINE 0.5 mL 31 gauge x 5/16" syringe USE AS DIRECTED. 100 each 14  dabigatran  (PRADAXA) 150 mg capsule TAKE 1 CAPSULE BY MOUTH TWICE A DAY 180 capsule 3  fluticasone-umeclidinium-vilanterol (TRELEGY ELLIPTA) 100-62.5-25 mcg inhaler INHALE 1 PUFF INTO THE LUNGS ONCE DAILY 180 each 2  gabapentin (NEURONTIN) 300 MG capsule TAKE 1 CAPSULE BY MOUTH THREE TIMES A DAY 270 capsule 3  glimepiride (AMARYL) 4 MG tablet TAKE 1 TABLET BY MOUTH EVERY DAY WITH BREAKFAST 90 tablet 3  L.rhamnosus-B.animalis (PHILLIPS' COLON HEALTH) 3 billion cell Cap Take 1 capsule by mouth once daily  loratadine (CLARITIN) 10 mg tablet Take 10 mg by mouth once daily  lovastatin (MEVACOR) 40 MG tablet TAKE 1 TABLET BY MOUTH EVERY DAY IN THE EVENING 90 tablet 1  magnesium oxide (MAG-OX) 400 mg (241.3 mg magnesium) tablet TAKE 1 TABLET BY MOUTH 3 TIMES DAILY. 270 tablet 1  meloxicam (MOBIC) 15 MG tablet TAKE 1 TABLET BY MOUTH EVERY DAY 90 tablet 1  metFORMIN (GLUCOPHAGE) 1000 MG tablet TAKE 1 TABLET BY MOUTH TWICE A DAY 180 tablet 1  metoprolol succinate (TOPROL-XL) 25 MG XL tablet TAKE 1 TABLET BY MOUTH EVERY DAY 90 tablet 4  neomycin-polymyxin-dexAMETHasone (MAXITROL) ophthalmic suspension Place 1 drop into both eyes 2 (two) times daily  omeprazole (PRILOSEC) 20 MG DR capsule TAKE 1 CAPSULE BY MOUTH EVERY DAY 90 capsule 1  QUEtiapine (SEROQUEL) 25 MG tablet Take 25 mg by mouth at bedtime  semaglutide (RYBELSUS) 14 mg tablet Take 1 tablet (14 mg total) by mouth once daily Do not cut, crush, or chew 90 tablet 1  tamsulosin (FLOMAX) 0.4 mg capsule TAKE 1 CAPSULE (0.4 MG TOTAL) BY MOUTH ONCE DAILY 30 MINUTES AFTER SAME MEAL EACH DAY. 90 capsule 1  albuterol 90  mcg/actuation inhaler Inhale 2 inhalations into the lungs every 6 (six) hours as needed for Wheezing 1 each 6  QUEtiapine (SEROQUEL) 50 MG tablet TAKE 1 TABLET BY MOUTH AT BEDTIME FOR 30 DAYS. (Patient not taking: Reported on 02/28/2022) 30 tablet 5   Allergies: Neosporin [Neomycin-Bacitracin-Polymyxin] (Itching and Rash when used topically)  Prednisone  Other (Hyperactivity)  Neomycin-Polymyxin-Gramicidin (Itching and Rash when used topically)   Review of Systems: A comprehensive 14 point ROS was performed, reviewed, and the pertinent orthopaedic findings are documented in the HPI.  Physical Exam: BP 124/74 (BP Location: Left upper arm, Patient Position: Sitting, BP Cuff Size: Large Adult)  Ht 180.3 cm (5\' 11" )  Wt (!) 118.8 kg (262 lb)  BMI 36.54 kg/m   General: Well-developed well-nourished male seen in no acute distress.   HEENT: Atraumatic,normocephalic. Pupils are equal and reactive to light. Oropharynx is clear with moist mucosa  Lungs: Clear to auscultation bilaterally   Cardiovascular: Regular rate and rhythm. Normal S1, S2. No murmurs. No appreciable gallops or rubs. Peripheral pulses are palpable.  Abdomen: Soft, non-tender, nondistended. Bowel sounds present  Extremity: Patient is noted to have contraction of the fifth finger of right hand. Most of his contraction is at the MP joint. There is no contraction of the IP or PIP joint. No tenderness noted to palpation. Neurovascular and neurosensory intact and within normal limits. Not able to fully extend the finger but has good flexion.  Neurological: The patient is alert and oriented Sensation to light touch appears to be intact and within normal limits Gross motor strength appeared to be equal to 5/5  Vascular : Peripheral pulses felt to be palpable. Capillary refill appears to be intact and within normal limits  Impression: 1. Dupuytren's contraction fifth finger right hand  Plan:  The treatment options, including both surgical and nonsurgical choices, have been discussed in detail with the patient. He would like to proceed with surgical intervention to include an excision of the Dupuytren's contracture involving the right little finger. The risks (including bleeding, infection, nerve and/or blood vessel injury, persistent or recurrent pain, recurrent  contracture, residual stiffness, weakness of grip, need for further surgery, blood clots, strokes, heart attacks or arrhythmias, pneumonia, etc.) and benefits of the surgical procedure were discussed. The patient states his understanding and agrees to proceed. A formal written consent will be obtained by the nursing staff.   H&P reviewed and patient re-examined. No changes.

## 2022-03-07 NOTE — Anesthesia Postprocedure Evaluation (Signed)
Anesthesia Post Note  Patient: Zachary Guerrero  Procedure(s) Performed: DUPUYTREN CONTRACTURE RELEASE (Right: Little Finger)  Patient location during evaluation: PACU Anesthesia Type: General Level of consciousness: awake and alert Pain management: pain level controlled Vital Signs Assessment: post-procedure vital signs reviewed and stable Respiratory status: spontaneous breathing, nonlabored ventilation, respiratory function stable and patient connected to nasal cannula oxygen Cardiovascular status: blood pressure returned to baseline and stable Postop Assessment: no apparent nausea or vomiting Anesthetic complications: no   No notable events documented.   Last Vitals:  Vitals:   03/07/22 1819 03/07/22 1833  BP: (!) 116/53 137/63  Pulse: 65 65  Resp: 13 14  Temp: (!) 36.1 C (!) 36.1 C  SpO2: 94% 97%    Last Pain:  Vitals:   03/07/22 1833  TempSrc: Temporal  PainSc: 0-No pain                 Precious Haws Sandeep Delagarza

## 2022-03-07 NOTE — Discharge Instructions (Addendum)
Orthopedic discharge instructions: Keep splint dry and intact. Keep hand elevated above heart level. Apply ice to affected area frequently. Take meloxicam 15 mg daily OR ibuprofen 600-800 mg TID with meals for 3-5 days, then as necessary. Take pain medication as prescribed or ES Tylenol when needed.  Return for follow-up in 10-14 days or as scheduled       .AMBULATORY SURGERY  DISCHARGE INSTRUCTIONS   The drugs that you were given will stay in your system until tomorrow so for the next 24 hours you should not:  Drive an automobile Make any legal decisions Drink any alcoholic beverage   You may resume regular meals tomorrow.  Today it is better to start with liquids and gradually work up to solid foods.  You may eat anything you prefer, but it is better to start with liquids, then soup and crackers, and gradually work up to solid foods.   Please notify your doctor immediately if you have any unusual bleeding, trouble breathing, redness and pain at the surgery site, drainage, fever, or pain not relieved by medication.     Your post-operative visit with Dr.                                       is: Date:                        Time:    Please call to schedule your post-operative visit.  Additional Instructions:

## 2022-03-08 ENCOUNTER — Encounter: Payer: Self-pay | Admitting: Surgery

## 2022-03-08 DIAGNOSIS — M720 Palmar fascial fibromatosis [Dupuytren]: Secondary | ICD-10-CM | POA: Insufficient documentation

## 2022-03-09 LAB — SURGICAL PATHOLOGY

## 2022-03-22 ENCOUNTER — Encounter: Payer: Self-pay | Admitting: Occupational Therapy

## 2022-03-22 ENCOUNTER — Ambulatory Visit: Payer: Medicare PPO | Attending: Student | Admitting: Occupational Therapy

## 2022-03-22 DIAGNOSIS — M25641 Stiffness of right hand, not elsewhere classified: Secondary | ICD-10-CM

## 2022-03-22 DIAGNOSIS — L905 Scar conditions and fibrosis of skin: Secondary | ICD-10-CM | POA: Diagnosis present

## 2022-03-22 DIAGNOSIS — M6281 Muscle weakness (generalized): Secondary | ICD-10-CM | POA: Insufficient documentation

## 2022-03-22 NOTE — Therapy (Signed)
[facility] [address], [place], [address] Phone: [phone]   Fax:  [phone]  Occupational Therapy Evaluation  Patient Details  Name: Zachary Guerrero MRN: [id number] Date of Birth: 02/12/1945 Referring Provider (OT): [place] PA   Encounter Date: 03/22/2022   OT End of Session - 03/22/22 2027     Visit Number 1    Number of Visits 8    Date for OT Re-Evaluation 05/17/22    OT Start Time 0955    OT Stop Time 1038    OT Time Calculation (min) 43 min    Activity Tolerance Patient tolerated treatment well    Behavior During Therapy [facility] for tasks assessed/performed             Past Medical History:  Diagnosis Date   Blepharoptosis    BILATERAL   COPD (chronic obstructive pulmonary disease) ([place])    Coronary artery disease    Diabetes mellitus without complication ([place])    Dysrhythmia    A-FIB/DR Nehemiah Massed   GERD (gastroesophageal reflux disease)    History of kidney stones    Hypercholesteremia    Hypertension    CONTROLLED ON MEDS   Pneumonia    IN PAST   Reiter's syndrome    RLS (restless legs syndrome)    Shortness of breath dyspnea    WITH EXERTION   Sleep apnea    C-PAP   Wears dentures    UPPERS    Past Surgical History:  Procedure Laterality Date   ARM SURGERY Right    FRACTURED   BROW LIFT Bilateral 01/04/2015   Procedure: BLEPHAROPLASTY;  Surgeon: Karle Starch, MD;  Location: [place];  Service: Ophthalmology;  Laterality: Bilateral;  DIABETIC-INSULIN AND ORAL MEDS/CPAP   CARDIAC CATHETERIZATION     CARDIAC SURGERY     CARDIOVERSION N/A 07/26/2021   Procedure: CARDIOVERSION;  Surgeon: Corey Skains, MD;  Location: [facility];  Service: Cardiovascular;  Laterality: N/A;   CATARACT EXTRACTION W/PHACO Left 04/18/2021   Procedure: CATARACT EXTRACTION PHACO AND INTRAOCULAR LENS PLACEMENT (IOC) LEFT DIABETIC 16.56 01:30.8;  Surgeon: Birder Robson, MD;  Location:  [place];  Service: Ophthalmology;  Laterality: Left;  Diabetic   CATARACT EXTRACTION W/PHACO Right 05/02/2021   Procedure: CATARACT EXTRACTION PHACO AND INTRAOCULAR LENS PLACEMENT ([place]) RIGHT DIABETIC;  Surgeon: Birder Robson, MD;  Location: [place];  Service: Ophthalmology;  Laterality: Right;  6.98 0:55.9   CHOLECYSTECTOMY     COLONOSCOPY WITH PROPOFOL N/A 12/26/2016   Procedure: COLONOSCOPY WITH PROPOFOL;  Surgeon: Toledo, Benay Pike, MD;  Location: [facility];  Service: Gastroenterology;  Laterality: N/A;   CORONARY ARTERY BYPASS GRAFT     X4   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   Procedure: DUPUYTREN CONTRACTURE RELEASE;  Surgeon: Corky Mull, MD;  Location: [facility];  Service: Orthopedics;  Laterality: Right;   ECTROPION REPAIR Bilateral 01/04/2015   Procedure: REPAIR OF ECTROPION EXTENSIVE;  Surgeon: Karle Starch, MD;  Location: [place];  Service: Ophthalmology;  Laterality: Bilateral;   ESOPHAGOGASTRODUODENOSCOPY (EGD) WITH PROPOFOL N/A 12/26/2016   Procedure: ESOPHAGOGASTRODUODENOSCOPY (EGD) WITH PROPOFOL;  Surgeon: Toledo, Benay Pike, MD;  Location: [facility];  Service: Gastroenterology;  Laterality: N/A;   ESOPHAGOGASTRODUODENOSCOPY (EGD) WITH PROPOFOL N/A 03/30/2020   Procedure: ESOPHAGOGASTRODUODENOSCOPY (EGD) WITH PROPOFOL;  Surgeon: Toledo, Benay Pike, MD;  Location: [facility];  Service: Gastroenterology;  Laterality: N/A;   HERNIA REPAIR Bilateral    groin   PTOSIS  REPAIR Bilateral 01/04/2015   Procedure: PTOSIS REPAIR;  Surgeon: Karle Starch, MD;  Location: [place];  Service: Ophthalmology;  Laterality: Bilateral;   TOE SURGERY      There were no vitals filed for this visit.   Subjective Assessment - 03/22/22 2009     Subjective  Doing okay - had my stitches taken out yesterday - it will be great if I can came out of this big splint    Pertinent History 03/21/22 Ortho visit-  had suture removal.  The patient is status post a Dupuytren's contracture release performed by Dr. Roland Rack on 03/07/2022. The patient is now 2 weeks status post surgery. The patient has remained in his short arm splint keeping the fingers in extension. He denies any injury or trauma affecting the right hand since surgery. He denies any signs of infection at home such as fevers chills or purulent drainage coming from the right hand at this time. The patient has been nonweightbearing to the right upper extremity. He denies any pain in the right hand at today's visit. He denies any numbness or tingling to the right upper extremity. Pain score today is a 0 out of 10. He is scheduled to work with occupational therapy beginning tomorrow. Refer to OT    Patient Stated Goals Want to be able to use my hand in working on my house, deck , handy work and fishing    Currently in Pain? 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, fishing      AROM   Right Wrist Extension 70 Degrees    Right Wrist Flexion 65 Degrees      Right Hand AROM   R Index  MCP 0-90 70 Degrees    R Index PIP 0-100 100 Degrees    R Long  MCP 0-90 80 Degrees    R Long PIP 0-100 100 Degrees    R Ring  MCP 0-90 90 Degrees   -35   R Ring PIP 0-100 100 Degrees    R Little  MCP 0-90 95 Degrees   -30   R Little PIP 0-100 100 Degrees                      OT Treatments/Exercises (OP) - 03/22/22 0001       RUE Contrast Bath   Time 8 minutes    Comments R hand prior to review of HEP               Fabricate hand base custom ext splint for 4th and 5th digits at 0 degrees ext To wear night time  Tubigrip D for hand to use day and night time  Tendon glides- 12 reps with focus on AROM and full extention  Opposition to all digits  10 reps And wrist flexion  and extention  12 reps       OT Education - 03/22/22 2027     Education Details Findings of eval and HEP    Person(s) Educated Patient    Methods Explanation;Demonstration;Tactile cues;Verbal cues;Handout    Comprehension Verbal cues required;Returned demonstration;Verbalized understanding              OT Short Term Goals - 03/22/22 2034       OT SHORT TERM GOAL #1   Title Pt to be ind in use of splint and HEP to maintain extention of 4th and 5th digit while improving composite flexion touching palm    Baseline Extention -30 to -35 at 4th and 5th MC - flexion decrease at [facility] 70-9 and ;5th PIP 90    Time 4    Period Weeks    Status New    Target Date 04/19/22               OT Long Term Goals - 03/22/22 2036       OT LONG TERM GOAL #1   Title Pt incision and scar improve for pt to tolerate massage, textures and gripping of tools without increase symptoms    Baseline Stitches removed yesterday and sterri strips in place    Time 6    Period Weeks    Status New    Target Date 05/03/22      OT LONG TERM GOAL #2   Title R grip and prehension strenght increase to more than 75% compare to L hand to start using tools    Baseline NT- 2 wks s/p -stitches out yesterday - sterri strips in place    Time 8    Period Weeks    Status New    Target Date 05/17/22                   Plan - 03/22/22 2029     Clinical Impression Statement Pt present at OT eval with diagnosis of R 5th digit Dupuytrens release on 03/07/22 by DR Poggi - pt present with stitches removed 03/21/22-and sterri strips in place - soft cast on. Pt incision from prox to [facility] to  5th middle phalanges. Pt show increase edema, incision  that will need scar management and decrease ROM/ strength. Pt with decrease MC extention of 4th and 5th and decrease composite flexion of digits. Fabricated hand base custom dorsal digits extention splint for 4th and 5th digit - pt to wear at night time at 0 degrees of extention. Pt lmited in use of R dominant hand in ADL's and IADL's - pt can benefit from skilled OT services to return to use of R dominant hand in ADL's and IADL's    OT Occupational Profile and History Problem Focused Assessment - Including review of records relating to presenting problem    Occupational performance deficits (Please refer to evaluation for details): ADL's;IADL's;Work;Play;Leisure;Social Participation    Body Structure / Function / Physical Skills ADL;IADL;Strength;Edema;Skin integrity;Scar mobility;Decreased knowledge of precautions;Flexibility;ROM;UE functional use    Rehab Potential Good    Clinical Decision Making Limited treatment options, no task modification necessary    Comorbidities Affecting Occupational Performance: --   DM   Modification or Assistance to Complete Evaluation  No modification of tasks or assist necessary to complete eval    OT Frequency 1x / week    OT Duration 8 weeks    OT Treatment/Interventions Self-care/ADL training;Contrast Bath;Fluidtherapy;Paraffin;Therapeutic exercise;Manual Therapy;Passive range of motion;Scar mobilization;Splinting;Patient/family education    Consulted  and Agree with Plan of Care Patient             Patient will benefit from skilled therapeutic intervention in order to improve the following deficits and impairments:   Body Structure / Function / Physical Skills: ADL, IADL, Strength, Edema, Skin integrity, Scar mobility, Decreased knowledge of precautions, Flexibility, ROM, UE functional use       Visit Diagnosis: Scar condition and fibrosis of skin  Stiffness of right hand, not elsewhere classified  Muscle weakness (generalized)    Problem  List There are no problems to display for this patient.   Rosalyn Gess, OTR/L,CLT 03/22/2022, 8:38 PM  [facility] [address] [place], [address] Phone: [phone]   Fax:  [phone]  Name: WILBON HAMMOND MRN: [id number] Date of Birth: 02/13/1945

## 2022-03-27 ENCOUNTER — Ambulatory Visit: Payer: Medicare PPO | Admitting: Occupational Therapy

## 2022-04-03 ENCOUNTER — Ambulatory Visit: Payer: Medicare PPO | Attending: Student | Admitting: Occupational Therapy

## 2022-04-03 DIAGNOSIS — M6281 Muscle weakness (generalized): Secondary | ICD-10-CM | POA: Insufficient documentation

## 2022-04-03 DIAGNOSIS — M25641 Stiffness of right hand, not elsewhere classified: Secondary | ICD-10-CM | POA: Insufficient documentation

## 2022-04-03 DIAGNOSIS — L905 Scar conditions and fibrosis of skin: Secondary | ICD-10-CM | POA: Insufficient documentation

## 2022-04-03 NOTE — Therapy (Signed)
[facility] [address], [place], [address] Phone: [phone]   Fax:  [phone]  Occupational Therapy Treatment  Patient Details  Name: Zachary Guerrero MRN: [id number] Date of Birth: August 04, 1945 Referring Provider (OT): [place] PA   Encounter Date: 04/03/2022   OT End of Session - 04/03/22 0905     Visit Number 2    Number of Visits 8    Date for OT Re-Evaluation 05/17/22    OT Start Time 0822    OT Stop Time 0850    OT Time Calculation (min) 28 min    Activity Tolerance Patient tolerated treatment well    Behavior During Therapy [facility] for tasks assessed/performed             Past Medical History:  Diagnosis Date   Blepharoptosis    BILATERAL   COPD (chronic obstructive pulmonary disease) ([place])    Coronary artery disease    Diabetes mellitus without complication ([place])    Dysrhythmia    A-FIB/DR Nehemiah Massed   GERD (gastroesophageal reflux disease)    History of kidney stones    Hypercholesteremia    Hypertension    CONTROLLED ON MEDS   Pneumonia    IN PAST   Reiter's syndrome    RLS (restless legs syndrome)    Shortness of breath dyspnea    WITH EXERTION   Sleep apnea    C-PAP   Wears dentures    UPPERS    Past Surgical History:  Procedure Laterality Date   ARM SURGERY Right    FRACTURED   BROW LIFT Bilateral 01/04/2015   Procedure: BLEPHAROPLASTY;  Surgeon: Karle Starch, MD;  Location: [place];  Service: Ophthalmology;  Laterality: Bilateral;  DIABETIC-INSULIN AND ORAL MEDS/CPAP   CARDIAC CATHETERIZATION     CARDIAC SURGERY     CARDIOVERSION N/A 07/26/2021   Procedure: CARDIOVERSION;  Surgeon: Corey Skains, MD;  Location: [facility];  Service: Cardiovascular;  Laterality: N/A;   CATARACT EXTRACTION W/PHACO Left 04/18/2021   Procedure: CATARACT EXTRACTION PHACO AND INTRAOCULAR LENS PLACEMENT (IOC) LEFT DIABETIC 16.56 01:30.8;  Surgeon: Birder Robson, MD;  Location: [place];  Service: Ophthalmology;  Laterality: Left;  Diabetic   CATARACT EXTRACTION W/PHACO Right 05/02/2021   Procedure: CATARACT EXTRACTION PHACO AND INTRAOCULAR LENS PLACEMENT ([place]) RIGHT DIABETIC;  Surgeon: Birder Robson, MD;  Location: [place];  Service: Ophthalmology;  Laterality: Right;  6.98 0:55.9   CHOLECYSTECTOMY     COLONOSCOPY WITH PROPOFOL N/A 12/26/2016   Procedure: COLONOSCOPY WITH PROPOFOL;  Surgeon: Toledo, Benay Pike, MD;  Location: [facility];  Service: Gastroenterology;  Laterality: N/A;   CORONARY ARTERY BYPASS GRAFT     X4   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   Procedure: DUPUYTREN CONTRACTURE RELEASE;  Surgeon: Corky Mull, MD;  Location: [facility];  Service: Orthopedics;  Laterality: Right;   ECTROPION REPAIR Bilateral 01/04/2015   Procedure: REPAIR OF ECTROPION EXTENSIVE;  Surgeon: Karle Starch, MD;  Location: [place];  Service: Ophthalmology;  Laterality: Bilateral;   ESOPHAGOGASTRODUODENOSCOPY (EGD) WITH PROPOFOL N/A 12/26/2016   Procedure: ESOPHAGOGASTRODUODENOSCOPY (EGD) WITH PROPOFOL;  Surgeon: Toledo, Benay Pike, MD;  Location: [facility];  Service: Gastroenterology;  Laterality: N/A;   ESOPHAGOGASTRODUODENOSCOPY (EGD) WITH PROPOFOL N/A 03/30/2020   Procedure: ESOPHAGOGASTRODUODENOSCOPY (EGD) WITH PROPOFOL;  Surgeon: Toledo, Benay Pike, MD;  Location: [facility];  Service: Gastroenterology;  Laterality: N/A;   HERNIA REPAIR Bilateral    groin   PTOSIS  REPAIR Bilateral 01/04/2015   Procedure: PTOSIS REPAIR;  Surgeon: Karle Starch, MD;  Location: [place];  Service: Ophthalmology;  Laterality: Bilateral;   TOE SURGERY      There were no vitals filed for this visit.   Subjective Assessment - 04/03/22 0903     Subjective  Doing much better- using it about 75% - tightness more than pain - not hammering, squeezing, pulling and gripping tools yet    Pertinent History 03/21/22 Ortho visit-  had suture  removal. The patient is status post a Dupuytren's contracture release performed by Dr. Roland Rack on 03/07/2022. The patient is now 2 weeks status post surgery. The patient has remained in his short arm splint keeping the fingers in extension. He denies any injury or trauma affecting the right hand since surgery. He denies any signs of infection at home such as fevers chills or purulent drainage coming from the right hand at this time. The patient has been nonweightbearing to the right upper extremity. He denies any pain in the right hand at today's visit. He denies any numbness or tingling to the right upper extremity. Pain score today is a 0 out of 10. He is scheduled to work with occupational therapy beginning tomorrow. Refer to OT    Patient Stated Goals Want to be able to use my hand in working on my house, deck , handy work and fishing    Currently in Pain? No/denies                [facility] OT Assessment - 04/03/22 0001       Strength   Right Hand Grip (lbs) 76    Right Hand Lateral Pinch 13 lbs    Right Hand 3 Point Pinch 16 lbs    Left Hand Grip (lbs) 84    Left Hand Lateral Pinch 12 lbs    Left Hand 3 Point Pinch 18 lbs      Right Hand AROM   R Index  MCP 0-90 80 Degrees    R Index PIP 0-100 95 Degrees    R Long  MCP 0-90 90 Degrees    R Long PIP 0-100 100 Degrees    R Ring  MCP 0-90 90 Degrees   0 ext   R Ring PIP 0-100 100 Degrees    R Little  MCP 0-90 90 Degrees   0 ext   R Little PIP 0-100 100 Degrees             Patient made great progress in range of motion as well as scar healing. Flexion within normal limits as well as extension within normal limits. 2 areas of scab's at distal palmar crease and proximal fold on fifth. Patient was educated in scar massage as well as Cica -Care scar pad for use at nighttime.  And silicone sleeve on fifth digit nighttime. With Tubigrip D over. Patient can do vitamin E or cocoa butter . Patient can discharge night splint for extension  if able to maintain extension as he increases his functional use using tools with the right hand. Patient following up in 2 weeks with surgeon Patient to continue with home program and contact me if needed for follow-up                OT Education - 04/03/22 0905     Education Details progress and HEP    Person(s) Educated Patient    Methods Explanation;Demonstration;Tactile cues;Verbal cues;Handout    Comprehension Verbal cues required;Returned  demonstration;Verbalized understanding              OT Short Term Goals - 04/03/22 0906       OT SHORT TERM GOAL #1   Title Pt to be ind in use of splint and HEP to maintain extention of 4th and 5th digit while improving composite flexion touching palm    Baseline Extention -30 to -35 at 4th and 5th MC - flexion decrease at MC's 70-9 and ;5th PIP 90 NOW full ext - discharge splint    Status Achieved               OT Long Term Goals - 04/03/22 0906       OT LONG TERM GOAL #1   Title Pt incision and scar improve for pt to tolerate massage, textures and gripping of tools without increase symptoms    Status Achieved      OT LONG TERM GOAL #2   Title R grip and prehension strenght increase to more than 75% compare to L hand to start using tools    Baseline Grip 76,R, L 84- lat 13 R , L 12 - and 3 point R 16 and L 18 lbs - using hand in 75% activities    Status Achieved                   Plan - 04/03/22 0906     Clinical Impression Statement Pt present at OT eval with diagnosis of R 5th digit Dupuytrens release on 03/07/22 by DR Poggi - sterri strips were still in place and decrease ROM and edema at that time.  Pt return this date with great progress in flexion as well as extention of digits - and scar healing. Pt can discharge extention splint and education done with pt doing scar massage and cica scar pad night time/silicon sleeve on 5th digit.  Pt cont to increase funtional use and strength but maintain  extention in 5th digit. Pt to follow up with surgeon in 2 wks- can cont at home with home program and follow up if needed.    OT Occupational Profile and History Problem Focused Assessment - Including review of records relating to presenting problem    Occupational performance deficits (Please refer to evaluation for details): ADL's;IADL's;Work;Play;Leisure;Social Participation    Body Structure / Function / Physical Skills ADL;IADL;Strength;Edema;Skin integrity;Scar mobility;Decreased knowledge of precautions;Flexibility;ROM;UE functional use    Rehab Potential Good    Clinical Decision Making Limited treatment options, no task modification necessary    Comorbidities Affecting Occupational Performance: --   DM   Modification or Assistance to Complete Evaluation  No modification of tasks or assist necessary to complete eval    OT Frequency Monthly    OT Duration 6 weeks    OT Treatment/Interventions Self-care/ADL training;Contrast Bath;Fluidtherapy;Paraffin;Therapeutic exercise;Manual Therapy;Passive range of motion;Scar mobilization;Splinting;Patient/family education    Consulted and Agree with Plan of Care Patient             Patient will benefit from skilled therapeutic intervention in order to improve the following deficits and impairments:   Body Structure / Function / Physical Skills: ADL, IADL, Strength, Edema, Skin integrity, Scar mobility, Decreased knowledge of precautions, Flexibility, ROM, UE functional use       Visit Diagnosis: Scar condition and fibrosis of skin  Stiffness of right hand, not elsewhere classified  Muscle weakness (generalized)    Problem List There are no problems to display for this patient.   Rosalyn Gess, OTR/L,CLT 04/03/2022,  9:13 AM  [facility] [address] [place], [address] Phone: [phone]   Fax:  [phone]  Name: Zachary Guerrero MRN: [id number] Date of Birth:  1945-04-04

## 2022-05-07 ENCOUNTER — Ambulatory Visit: Payer: Medicare PPO | Admitting: Dermatology

## 2022-05-07 ENCOUNTER — Encounter: Payer: Self-pay | Admitting: Dermatology

## 2022-05-07 VITALS — BP 137/63

## 2022-05-07 DIAGNOSIS — D485 Neoplasm of uncertain behavior of skin: Secondary | ICD-10-CM | POA: Diagnosis not present

## 2022-05-07 DIAGNOSIS — L578 Other skin changes due to chronic exposure to nonionizing radiation: Secondary | ICD-10-CM

## 2022-05-07 DIAGNOSIS — L820 Inflamed seborrheic keratosis: Secondary | ICD-10-CM

## 2022-05-07 DIAGNOSIS — L988 Other specified disorders of the skin and subcutaneous tissue: Secondary | ICD-10-CM | POA: Diagnosis not present

## 2022-05-07 DIAGNOSIS — L821 Other seborrheic keratosis: Secondary | ICD-10-CM

## 2022-05-07 DIAGNOSIS — D2239 Melanocytic nevi of other parts of face: Secondary | ICD-10-CM

## 2022-05-07 NOTE — Patient Instructions (Addendum)
Cryotherapy Aftercare  Wash gently with soap and water everyday.   Apply Vaseline and Band-Aid daily until healed.     Due to recent changes in healthcare laws, you may see results of your pathology and/or laboratory studies on MyChart before the doctors have had a chance to review them. We understand that in some cases there may be results that are confusing or concerning to you. Please understand that not all results are received at the same time and often the doctors may need to interpret multiple results in order to provide you with the best plan of care or course of treatment. Therefore, we ask that you please give us 2 business days to thoroughly review all your results before contacting the office for clarification. Should we see a critical lab result, you will be contacted sooner.   If You Need Anything After Your Visit  If you have any questions or concerns for your doctor, please call our main line at [phone] and press option 4 to reach your doctor's medical assistant. If no one answers, please leave a voicemail as directed and we will return your call as soon as possible. Messages left after 4 pm will be answered the following business day.   You may also send us a message via MyChart. We typically respond to MyChart messages within 1-2 business days.  For prescription refills, please ask your pharmacy to contact our office. Our fax number is [phone].  If you have an urgent issue when the clinic is closed that cannot wait until the next business day, you can page your doctor at the number below.    Please note that while we do our best to be available for urgent issues outside of office hours, we are not available 24/7.   If you have an urgent issue and are unable to reach us, you may choose to seek medical care at your doctor's office, retail clinic, urgent care center, or emergency room.  If you have a medical emergency, please immediately call 911 or go to the  emergency department.  Pager Numbers  - Dr. Kowalski: [phone]  - Dr. Moye: [phone]  - Dr. Stewart: [phone]  In the event of inclement weather, please call our main line at [phone] for an update on the status of any delays or closures.  Dermatology Medication Tips: Please keep the boxes that topical medications come in in order to help keep track of the instructions about where and how to use these. Pharmacies typically print the medication instructions only on the boxes and not directly on the medication tubes.   If your medication is too expensive, please contact our office at [phone] option 4 or send us a message through MyChart.   We are unable to tell what your co-pay for medications will be in advance as this is different depending on your insurance coverage. However, we may be able to find a substitute medication at lower cost or fill out paperwork to get insurance to cover a needed medication.   If a prior authorization is required to get your medication covered by your insurance company, please allow us 1-2 business days to complete this process.  Drug prices often vary depending on where the prescription is filled and some pharmacies may offer cheaper prices.  The website www.goodrx.com contains coupons for medications through different pharmacies. The prices here do not account for what the cost may be with help from insurance (it may be cheaper with your insurance), but the website can   give you the price if you did not use any insurance.  - You can print the associated coupon and take it with your prescription to the pharmacy.  - You may also stop by our office during regular business hours and pick up a GoodRx coupon card.  - If you need your prescription sent electronically to a different pharmacy, notify our office through [facility] MyChart or by phone at [phone] option 4.     Si Usted Necesita Algo Despus de Su Visita  Tambin puede  enviarnos un mensaje a travs de MyChart. Por lo general respondemos a los mensajes de MyChart en el transcurso de 1 a 2 das hbiles.  Para renovar recetas, por favor pida a su farmacia que se ponga en contacto con nuestra oficina. Nuestro nmero de fax es el [phone].  Si tiene un asunto urgente cuando la clnica est cerrada y que no puede esperar hasta el siguiente da hbil, puede llamar/localizar a su doctor(a) al nmero que aparece a continuacin.   Por favor, tenga en cuenta que aunque hacemos todo lo posible para estar disponibles para asuntos urgentes fuera del horario de oficina, no estamos disponibles las 24 horas del da, los 7 das de la semana.   Si tiene un problema urgente y no puede comunicarse con nosotros, puede optar por buscar atencin mdica  en el consultorio de su doctor(a), en una clnica privada, en un centro de atencin urgente o en una sala de emergencias.  Si tiene una emergencia mdica, por favor llame inmediatamente al 911 o vaya a la sala de emergencias.  Nmeros de bper  - Dr. Kowalski: [phone]  - Dra. Moye: [phone]  - Dra. Stewart: [phone]  En caso de inclemencias del tiempo, por favor llame a nuestra lnea principal al [phone] para una actualizacin sobre el estado de cualquier retraso o cierre.  Consejos para la medicacin en dermatologa: Por favor, guarde las cajas en las que vienen los medicamentos de uso tpico para ayudarle a seguir las instrucciones sobre dnde y cmo usarlos. Las farmacias generalmente imprimen las instrucciones del medicamento slo en las cajas y no directamente en los tubos del medicamento.   Si su medicamento es muy caro, por favor, pngase en contacto con nuestra oficina llamando al [phone] y presione la opcin 4 o envenos un mensaje a travs de MyChart.   No podemos decirle cul ser su copago por los medicamentos por adelantado ya que esto es diferente dependiendo de la cobertura de su seguro.  Sin embargo, es posible que podamos encontrar un medicamento sustituto a menor costo o llenar un formulario para que el seguro cubra el medicamento que se considera necesario.   Si se requiere una autorizacin previa para que su compaa de seguros cubra su medicamento, por favor permtanos de 1 a 2 das hbiles para completar este proceso.  Los precios de los medicamentos varan con frecuencia dependiendo del lugar de dnde se surte la receta y alguna farmacias pueden ofrecer precios ms baratos.  El sitio web www.goodrx.com tiene cupones para medicamentos de diferentes farmacias. Los precios aqu no tienen en cuenta lo que podra costar con la ayuda del seguro (puede ser ms barato con su seguro), pero el sitio web puede darle el precio si no utiliz ningn seguro.  - Puede imprimir el cupn correspondiente y llevarlo con su receta a la farmacia.  - Tambin puede pasar por nuestra oficina durante el horario de atencin regular y recoger una tarjeta de cupones de GoodRx.  -   Si necesita que su receta se enve electrnicamente a una farmacia diferente, informe a nuestra oficina a travs de MyChart de [facility] o por telfono llamando al [phone] y presione la opcin 4.  

## 2022-05-07 NOTE — Progress Notes (Signed)
   New Patient Visit   Subjective  Zachary Guerrero is a 77 y.o. male who presents for the following: spots at face that patient picks at and are irritating. Patient would also like back checked. No hx of skin cancer.  The patient has spots, moles and lesions to be evaluated, some may be new or changing and the patient has concerns that these could be cancer.  The following portions of the chart were reviewed this encounter and updated as appropriate: medications, allergies, medical history  Review of Systems:  No other skin or systemic complaints except as noted in HPI or Assessment and Plan.  Objective  Well appearing patient in no apparent distress; mood and affect are within normal limits.  A focused examination was performed of the following areas: Forehead, temples, back  Relevant exam findings are noted in the Assessment and Plan.  right superior forehead Flesh papule 0.4 cm         Assessment & Plan   INFLAMED SEBORRHEIC KERATOSIS Exam: Erythematous keratotic or waxy stuck-on papule or plaque.  Symptomatic, irritating, patient would like treated.  Benign-appearing.  Call clinic for new or changing lesions.   Prior to procedure, discussed risks of blister formation, small wound, skin dyspigmentation, or rare scar following treatment. Recommend Vaseline ointment to treated areas while healing.  Destruction Procedure Note Destruction method: cryotherapy   Informed consent: discussed and consent obtained   Lesion destroyed using liquid nitrogen: Yes   Outcome: patient tolerated procedure well with no complications   Post-procedure details: wound care instructions given   Locations: forehead, temples, back x 2 # of Lesions Treated: 43  ACTINIC DAMAGE - chronic, secondary to cumulative UV radiation exposure/sun exposure over time - diffuse scaly erythematous macules with underlying dyspigmentation - Recommend daily broad spectrum sunscreen SPF 30+ to sun-exposed  areas, reapply every 2 hours as needed.  - Recommend staying in the shade or wearing long sleeves, sun glasses (UVA+UVB protection) and wide brim hats (4-inch brim around the entire circumference of the hat). - Call for new or changing lesions.  SEBORRHEIC KERATOSIS - Stuck-on, waxy, tan-brown papules and/or plaques  - Benign-appearing - Discussed benign etiology and prognosis. - Observe - Call for any changes  Neoplasm of uncertain behavior of skin - appears to be a benign nevus right superior forehead Recheck on follow up Benign-appearing.  Observation.  Call clinic for new or changing lesions.  Recommend daily use of broad spectrum spf 30+ sunscreen to sun-exposed areas.   FACIAL ELASTOSIS Exam: Rhytides and volume loss. Treatment Plan: Recommend blepharoplasty with ophthalmologist or plastic surgeon.  Recommend daily broad spectrum sunscreen SPF 30+ to sun-exposed areas, reapply every 2 hours as needed. Call for new or changing lesions.  Staying in the shade or wearing long sleeves, sun glasses (UVA+UVB protection) and wide brim hats (4-inch brim around the entire circumference of the hat) are also recommended for sun protection.   Return for 3-4 months , ISK follow up, recheck nevus.  Anise Salvo, RMA, am acting as scribe for Armida Sans, MD .  Documentation: I have reviewed the above documentation for accuracy and completeness, and I agree with the above.  Armida Sans, MD

## 2022-05-15 ENCOUNTER — Encounter: Payer: Self-pay | Admitting: Dermatology

## 2022-09-05 ENCOUNTER — Ambulatory Visit: Payer: Medicare PPO | Admitting: Dermatology

## 2022-09-05 ENCOUNTER — Encounter: Payer: Self-pay | Admitting: Dermatology

## 2022-09-05 DIAGNOSIS — L570 Actinic keratosis: Secondary | ICD-10-CM

## 2022-09-05 DIAGNOSIS — L820 Inflamed seborrheic keratosis: Secondary | ICD-10-CM

## 2022-09-05 DIAGNOSIS — W908XXA Exposure to other nonionizing radiation, initial encounter: Secondary | ICD-10-CM | POA: Diagnosis not present

## 2022-09-05 NOTE — Patient Instructions (Addendum)
Recommend Dermend Fragile Skin Formula twice a day. This should be used with sunscreen since it can make you more sensitive to the sun.   Cryotherapy Aftercare  Wash gently with soap and water everyday.   Apply Vaseline and Band-Aid daily until healed.   Due to recent changes in healthcare laws, you may see results of your pathology and/or laboratory studies on MyChart before the doctors have had a chance to review them. We understand that in some cases there may be results that are confusing or concerning to you. Please understand that not all results are received at the same time and often the doctors may need to interpret multiple results in order to provide you with the best plan of care or course of treatment. Therefore, we ask that you please give [place] 2 business days to thoroughly review all your results before contacting the office for clarification. Should we see a critical lab result, you will be contacted sooner.   If You Need Anything After Your Visit  If you have any questions or concerns for your doctor, please call our main line at [phone] and press option 4 to reach your doctor's medical assistant. If no one answers, please leave a voicemail as directed and we will return your call as soon as possible. Messages left after 4 pm will be answered the following business day.   You may also send [place] a message via MyChart. We typically respond to MyChart messages within 1-2 business days.  For prescription refills, please ask your pharmacy to contact our office. Our fax number is [phone].  If you have an urgent issue when the clinic is closed that cannot wait until the next business day, you can page your doctor at the number below.    Please note that while we do our best to be available for urgent issues outside of office hours, we are not available 24/7.   If you have an urgent issue and are unable to reach [place], you may choose to seek medical care at your doctor's office,  retail clinic, urgent care center, or emergency room.  If you have a medical emergency, please immediately call 911 or go to the emergency department.  Pager Numbers  - Dr. [name]: [phone]  - Dr. Roseanne Reno: [phone]  In the event of inclement weather, please call our main line at [phone] for an update on the status of any delays or closures.  Dermatology Medication Tips: Please keep the boxes that topical medications come in in order to help keep track of the instructions about where and how to use these. Pharmacies typically print the medication instructions only on the boxes and not directly on the medication tubes.   If your medication is too expensive, please contact our office at [phone] option 4 or send [place] a message through MyChart.   We are unable to tell what your co-pay for medications will be in advance as this is different depending on your insurance coverage. However, we may be able to find a substitute medication at lower cost or fill out paperwork to get insurance to cover a needed medication.   If a prior authorization is required to get your medication covered by your insurance company, please allow [place] 1-2 business days to complete this process.  Drug prices often vary depending on where the prescription is filled and some pharmacies may offer cheaper prices.  The website www.goodrx.com contains coupons for medications through different pharmacies. The prices here do not account for what  the cost may be with help from insurance (it may be cheaper with your insurance), but the website can give you the price if you did not use any insurance.  - You can print the associated coupon and take it with your prescription to the pharmacy.  - You may also stop by our office during regular business hours and pick up a GoodRx coupon card.  - If you need your prescription sent electronically to a different pharmacy, notify our office through [facility] or by  phone at [phone] option 4.

## 2022-09-05 NOTE — Progress Notes (Signed)
   Follow-Up Visit   Subjective  Zachary Guerrero is a 77 y.o. male who presents for the following: ISK and nevus follow up. Patient also with some areas at back that are itchy and irritated.   The patient has spots, moles and lesions to be evaluated, some may be new or changing and the patient may have concern these could be cancer.   The following portions of the chart were reviewed this encounter and updated as appropriate: medications, allergies, medical history  Review of Systems:  No other skin or systemic complaints except as noted in HPI or Assessment and Plan.  Objective  Well appearing patient in no apparent distress; mood and affect are within normal limits.   A focused examination was performed of the following areas: Face, scalp, trunk  Relevant exam findings are noted in the Assessment and Plan.  sup to left medial brow x 1, R frontal scalp x 1, R parietal scalp x 1 (3) Erythematous thin papules/macules with gritty scale.   L flank x 1, L parietal scalp x 1, R frontal scalp x 1, L forehead x 1, back x 5 (9) Erythematous stuck-on, waxy papule or plaque    Assessment & Plan    AK (actinic keratosis) (3) sup to left medial brow x 1, R frontal scalp x 1, R parietal scalp x 1  Actinic keratoses are precancerous spots that appear secondary to cumulative UV radiation exposure/sun exposure over time. They are chronic with expected duration over 1 year. A portion of actinic keratoses will progress to squamous cell carcinoma of the skin. It is not possible to reliably predict which spots will progress to skin cancer and so treatment is recommended to prevent development of skin cancer.  Recommend daily broad spectrum sunscreen SPF 30+ to sun-exposed areas, reapply every 2 hours as needed.  Recommend staying in the shade or wearing long sleeves, sun glasses (UVA+UVB protection) and wide brim hats (4-inch brim around the entire circumference of the hat). Call for new or  changing lesions.   Destruction of lesion - sup to left medial brow x 1, R frontal scalp x 1, R parietal scalp x 1 (3)  Destruction method: cryotherapy   Informed consent: discussed and consent obtained   Lesion destroyed using liquid nitrogen: Yes   Cryotherapy cycles:  2 Outcome: patient tolerated procedure well with no complications   Post-procedure details: wound care instructions given    Inflamed seborrheic keratosis (9) L flank x 1, L parietal scalp x 1, R frontal scalp x 1, L forehead x 1, back x 5  Symptomatic, irritating, patient would like treated.  Benign-appearing.  Call clinic for new or changing lesions.    Destruction of lesion - L flank x 1, L parietal scalp x 1, R frontal scalp x 1, L forehead x 1, back x 5 (9)  Destruction method: cryotherapy   Informed consent: discussed and consent obtained   Lesion destroyed using liquid nitrogen: Yes   Cryotherapy cycles:  2 Outcome: patient tolerated procedure well with no complications   Post-procedure details: wound care instructions given      Return for with Dr. Kirtland Bouchard to recheck nevus, ISK follow up, next available.  Anise Salvo, RMA, am acting as scribe for Elie Goody, MD .   Documentation: I have reviewed the above documentation for accuracy and completeness, and I agree with the above.  Elie Goody, MD

## 2022-11-30 ENCOUNTER — Ambulatory Visit: Payer: Medicare PPO | Admitting: Anesthesiology

## 2022-11-30 ENCOUNTER — Ambulatory Visit: Admission: RE | Admit: 2022-11-30 | Payer: Medicare PPO | Source: Home / Self Care | Admitting: Cardiovascular Disease

## 2022-11-30 ENCOUNTER — Ambulatory Visit
Admission: RE | Admit: 2022-11-30 | Discharge: 2022-11-30 | Disposition: A | Discharge status: Home / Self Care | Payer: Medicare PPO | Source: Home / Self Care | Attending: Student | Admitting: Student

## 2022-11-30 ENCOUNTER — Encounter
Admission: RE | Disposition: A | Discharge status: Home / Self Care | Payer: Self-pay | Source: Home / Self Care | Attending: Cardiovascular Disease

## 2022-11-30 ENCOUNTER — Ambulatory Visit
Admission: RE | Admit: 2022-11-30 | Discharge: 2022-11-30 | Disposition: A | Discharge status: Home / Self Care | Payer: Medicare PPO | Attending: Cardiovascular Disease | Admitting: Cardiovascular Disease

## 2022-11-30 ENCOUNTER — Other Ambulatory Visit: Payer: Self-pay

## 2022-11-30 ENCOUNTER — Encounter: Payer: Self-pay | Admitting: Cardiovascular Disease

## 2022-11-30 DIAGNOSIS — G4730 Sleep apnea, unspecified: Secondary | ICD-10-CM | POA: Insufficient documentation

## 2022-11-30 DIAGNOSIS — I4819 Other persistent atrial fibrillation: Secondary | ICD-10-CM

## 2022-11-30 DIAGNOSIS — J449 Chronic obstructive pulmonary disease, unspecified: Secondary | ICD-10-CM | POA: Diagnosis not present

## 2022-11-30 DIAGNOSIS — I480 Paroxysmal atrial fibrillation: Secondary | ICD-10-CM | POA: Diagnosis present

## 2022-11-30 DIAGNOSIS — Z7984 Long term (current) use of oral hypoglycemic drugs: Secondary | ICD-10-CM | POA: Diagnosis not present

## 2022-11-30 DIAGNOSIS — Z951 Presence of aortocoronary bypass graft: Secondary | ICD-10-CM | POA: Diagnosis not present

## 2022-11-30 DIAGNOSIS — R0609 Other forms of dyspnea: Secondary | ICD-10-CM | POA: Diagnosis not present

## 2022-11-30 DIAGNOSIS — I2510 Atherosclerotic heart disease of native coronary artery without angina pectoris: Secondary | ICD-10-CM | POA: Insufficient documentation

## 2022-11-30 DIAGNOSIS — K219 Gastro-esophageal reflux disease without esophagitis: Secondary | ICD-10-CM | POA: Insufficient documentation

## 2022-11-30 DIAGNOSIS — E119 Type 2 diabetes mellitus without complications: Secondary | ICD-10-CM | POA: Insufficient documentation

## 2022-11-30 DIAGNOSIS — I4891 Unspecified atrial fibrillation: Secondary | ICD-10-CM | POA: Diagnosis not present

## 2022-11-30 DIAGNOSIS — I10 Essential (primary) hypertension: Secondary | ICD-10-CM | POA: Diagnosis not present

## 2022-11-30 DIAGNOSIS — I340 Nonrheumatic mitral (valve) insufficiency: Secondary | ICD-10-CM | POA: Diagnosis not present

## 2022-11-30 DIAGNOSIS — Z87891 Personal history of nicotine dependence: Secondary | ICD-10-CM | POA: Diagnosis not present

## 2022-11-30 HISTORY — PX: CARDIOVERSION: SHX1299

## 2022-11-30 HISTORY — PX: TEE WITHOUT CARDIOVERSION: SHX5443

## 2022-11-30 LAB — GLUCOSE, CAPILLARY: Glucose-Capillary: 106 mg/dL — ABNORMAL HIGH (ref 70–99)

## 2022-11-30 LAB — ECHO TEE

## 2022-11-30 SURGERY — ECHOCARDIOGRAM, TRANSESOPHAGEAL
Anesthesia: General

## 2022-11-30 MED ORDER — PROPOFOL 10 MG/ML IV BOLUS
INTRAVENOUS | Status: AC
  Filled 2022-11-30: qty 20

## 2022-11-30 MED ORDER — PROPOFOL 10 MG/ML IV BOLUS
INTRAVENOUS | Status: DC | PRN
  Administered 2022-11-30: 30 mg via INTRAVENOUS
  Administered 2022-11-30: 20 mg via INTRAVENOUS
  Administered 2022-11-30: 40 mg via INTRAVENOUS
  Administered 2022-11-30: 20 mg via INTRAVENOUS
  Administered 2022-11-30: 10 mg via INTRAVENOUS
  Administered 2022-11-30: 30 mg via INTRAVENOUS
  Administered 2022-11-30: 20 mg via INTRAVENOUS

## 2022-11-30 MED ORDER — FENTANYL CITRATE (PF) 100 MCG/2ML IJ SOLN: 25.0000 ug | INTRAMUSCULAR | Status: DC | PRN

## 2022-11-30 MED ORDER — LIDOCAINE VISCOUS HCL 2 % MT SOLN
OROMUCOSAL | Status: AC
  Filled 2022-11-30: qty 15

## 2022-11-30 MED ORDER — BUTAMBEN-TETRACAINE-BENZOCAINE 2-2-14 % EX AERO
INHALATION_SPRAY | CUTANEOUS | Status: AC
  Filled 2022-11-30: qty 5

## 2022-11-30 MED ORDER — SODIUM CHLORIDE 0.9 % IV SOLN: INTRAVENOUS | Status: DC

## 2022-11-30 NOTE — Progress Notes (Signed)
Transesophageal echocardiogram preliminary report  Zachary Guerrero [id number] 1945-04-01  Preliminary diagnosis AF, MR  Postprocedural diagnosis same  Time out A timeout was performed by the nursing staff and physicians specifically identifying the procedure performed, identification of the patient, the type of sedation, all allergies and medications, all pertinent medical history, and presedation assessment of nasopharynx. The patient and or family understand the risks of the procedure including the rare risks of death, stroke, heart attack, esophogeal perforation, sore throat, and reaction to medications given.  General anesthesia CRNA administered GA. See MAR. 170 mg propofol. The patient had continued monitoring of heart rate, oxygenation, blood pressure, respiratory rate, and extent of signs of sedation throughout the entire procedure.  The patient received anesthesia over a period of 20 minutes.  CRNA, nursing staff and I were present during the procedure when the patient was anesthetized for 100% of the time.  Treatment considerations No LAA thrombus  - see separately dictated DCCV note  For further details of transesophageal echocardiogram please refer to final report.  Lenn Cal [facility] MD [facility] 11/30/2022 12:39 PM

## 2022-11-30 NOTE — Anesthesia Preprocedure Evaluation (Addendum)
Anesthesia Evaluation  Patient identified by MRN, date of birth, ID band Patient awake    Reviewed: Allergy & Precautions, NPO status , Patient's Chart, lab work & pertinent test results  History of Anesthesia Complications Negative for: history of anesthetic complications  Airway Mallampati: III  TM Distance: <3 FB Neck ROM: full    Dental  (+) Chipped, Poor Dentition, Missing, Partial Upper, Dental Advidsory Given, Upper Dentures   Pulmonary shortness of breath and with exertion, sleep apnea and Continuous Positive Airway Pressure Ventilation , COPD, neg recent URI, former smoker    + decreased breath sounds      Cardiovascular hypertension, (-) angina + CAD and + CABG (2006)  (-) Past MI and (-) Cardiac Stents + dysrhythmias Atrial Fibrillation (-) Valvular Problems/Murmurs Rhythm:irregular Rate:Normal  MPS 2023: LVEF= 63%  FINDINGS:  Regional wall motion:  reveals normal myocardial thickening and wall  motion.  The overall quality of the study is good.   Artifacts noted: no  Left ventricular cavity: normal.     Neuro/Psych negative neurological ROS  negative psych ROS   GI/Hepatic negative GI ROS, Neg liver ROS,neg GERD  ,,  Endo/Other  diabetes, Type 2    Renal/GU Renal disease  negative genitourinary   Musculoskeletal   Abdominal   Peds  Hematology negative hematology ROS (+)   Anesthesia Other Findings Past Medical History: 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 No date: Chronic kidney disease     Comment:  KIDNEY STONES No date: COPD (chronic obstructive pulmonary disease) (HCC) No date: Coronary artery disease No date: Diabetes mellitus without complication (HCC) No date: Dysrhythmia     Comment:  A-FIB/DR KOWALSKI No date: Hypercholesteremia No date: Hypertension     Comment:  CONTROLLED ON MEDS No date: Pneumonia     Comment:  IN PAST 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 No date: Shortness  of breath dyspnea     Comment:  WITH EXERTION No date: Sleep apnea     Comment:  C-PAP No date: Wears dentures     Comment:  UPPERS  Past Surgical History: No date: ARM SURGERY     Comment:  FRACTURED 01/04/2015: BROW LIFT; Bilateral     Comment:  Procedure: BLEPHAROPLASTY;  Surgeon: Imagene Riches, MD;                Location: [facility];  Service: Ophthalmology;                Laterality: Bilateral;  DIABETIC-INSULIN AND ORAL               MEDS/CPAP No date: CARDIAC CATHETERIZATION No date: CARDIAC SURGERY 04/18/2021: CATARACT EXTRACTION W/PHACO; Left     Comment:  Procedure: CATARACT EXTRACTION PHACO AND INTRAOCULAR               LENS PLACEMENT (IOC) LEFT DIABETIC 16.56 01:30.8;                Surgeon: Galen Manila, MD;  Location: [facility]              CNTR;  Service: Ophthalmology;  Laterality: Left;                Diabetic 05/02/2021: CATARACT EXTRACTION W/PHACO; Right     Comment:  Procedure: CATARACT EXTRACTION PHACO AND INTRAOCULAR               LENS PLACEMENT (IOC) RIGHT DIABETIC;  Surgeon: Druscilla Brownie,  Chrissie Noa, MD;  Location: [facility];  Service:               Ophthalmology;  Laterality: Right;  6.98 0:55.9 No date: CHOLECYSTECTOMY 12/26/2016: COLONOSCOPY WITH PROPOFOL; N/A     Comment:  Procedure: COLONOSCOPY WITH PROPOFOL;  Surgeon: Toledo,               Boykin Nearing, MD;  Location: [facility] ENDOSCOPY;  Service:               Gastroenterology;  Laterality: N/A; No date: CORONARY ARTERY BYPASS GRAFT     Comment:  X4 01/04/2015: ECTROPION REPAIR; Bilateral     Comment:  Procedure: REPAIR OF ECTROPION EXTENSIVE;  Surgeon: Imagene Riches, MD;  Location: [facility];  Service:               Ophthalmology;  Laterality: Bilateral; 12/26/2016: ESOPHAGOGASTRODUODENOSCOPY (EGD) WITH PROPOFOL; N/A     Comment:  Procedure: ESOPHAGOGASTRODUODENOSCOPY (EGD) WITH               PROPOFOL;  Surgeon: Toledo, Boykin Nearing, MD;  Location:                [facility] ENDOSCOPY;  Service: Gastroenterology;  Laterality:               N/A; 03/30/2020: ESOPHAGOGASTRODUODENOSCOPY (EGD) WITH PROPOFOL; N/A     Comment:  Procedure: ESOPHAGOGASTRODUODENOSCOPY (EGD) WITH               PROPOFOL;  Surgeon: Toledo, Boykin Nearing, MD;  Location:               [facility] ENDOSCOPY;  Service: Gastroenterology;  Laterality:               N/A; No date: HERNIA REPAIR 01/04/2015: PTOSIS REPAIR; Bilateral     Comment:  Procedure: PTOSIS REPAIR;  Surgeon: Imagene Riches, MD;                Location: [facility] SUR[facility];  Service: Ophthalmology;                Laterality: Bilateral; No date: TOE SURGERY  BMI    Body Mass Index: 37.66 kg/m      Reproductive/Obstetrics negative OB ROS                             Anesthesia Physical Anesthesia Plan  ASA: 3  Anesthesia Plan: General   Post-op Pain Management:    Induction: Intravenous  PONV Risk Score and Plan: 2 and Propofol infusion and TIVA  Airway Management Planned: Natural Airway and Nasal Cannula  Additional Equipment:   Intra-op Plan:   Post-operative Plan:   Informed Consent: I have reviewed the patients History and Physical, chart, labs and discussed the procedure including the risks, benefits and alternatives for the proposed anesthesia with the patient or authorized representative who has indicated his/her understanding and acceptance.     Dental Advisory Given  Plan Discussed with: Anesthesiologist, CRNA and Surgeon  Anesthesia Plan Comments: (Patient consented for risk of leaving partial in and opted to proceed with his partial in for the procedure.  Patient consented for risks of anesthesia including but not limited to:  - adverse reactions to medications - risk of airway placement if required - damage to eyes, teeth, lips or other oral mucosa - nerve damage due  to positioning  - sore throat or hoarseness - Damage to heart, brain, nerves, lungs, other  parts of body or loss of life  Patient voiced understanding.)        Anesthesia Quick Evaluation

## 2022-11-30 NOTE — Procedures (Signed)
Electrical Cardioversion Procedure Note  Procedure: Electrical Cardioversion Indications:  Atrial Fibrillation  Procedure Details Consent: Risks of procedure as well as the alternatives and risks of each were explained to the (patient/caregiver).  Consent for procedure obtained. Time Out Performed: Verified patient identification, verified procedure, site/side was marked, verified correct patient position, special equipment/implants available, medications/allergies/relevent history reviewed, required imaging and test results available.   TEE performed prior to DCCV - see separately dictated TEE report  Patient placed on cardiac monitor, pulse oximetry, supplemental oxygen as necessary.  Sedation given:  propofol  170 mg Pacer pads placed anterior and posterior chest.  Cardioverted 1 time(s).  Cardioverted at 200J.  Evaluation Findings: Post procedure EKG shows: NSR Complications: None Patient did tolerate procedure well.   Tiajuana Amass MD [facility] 12:41 PM 11/30/22

## 2022-11-30 NOTE — Anesthesia Procedure Notes (Signed)
Date/Time: 11/30/2022 12:10 PM  Performed by: Elmarie Mainland, CRNAPre-anesthesia Checklist: Patient identified, Suction available, Emergency Drugs available and Patient being monitored Patient Re-evaluated:Patient Re-evaluated prior to induction Oxygen Delivery Method: Simple face mask

## 2022-11-30 NOTE — H&P (Signed)
Pre-procedure History & Physical    Patient ID: Zachary Guerrero MRN: [id number]; DOB: 1946-01-21   Date of procedure: 11/30/2022  Primary Care Provider: Marguarite Arbour, MD Primary Cardiologist: None   Planned procedure:  TEE / DCCV  HPI:   Zachary Guerrero is a 77 y.o. male with paroxysmal AF here for TEE/DCCV  Past Medical History:  Diagnosis Date   Blepharoptosis    BILATERAL   COPD (chronic obstructive pulmonary disease) (HCC)    Coronary artery disease    Diabetes mellitus without complication (HCC)    Dysrhythmia    A-FIB/DR [name]   GERD (gastroesophageal reflux disease)    History of kidney stones    Hypercholesteremia    Hypertension    CONTROLLED ON MEDS   Pneumonia    IN PAST   Reiter's syndrome    RLS (restless legs syndrome)    Shortness of breath dyspnea    WITH EXERTION   Sleep apnea    C-PAP   Wears dentures    UPPERS    Past Surgical History:  Procedure Laterality Date   ARM SURGERY Right    FRACTURED   BROW LIFT Bilateral 01/04/2015   Procedure: BLEPHAROPLASTY;  Surgeon: Imagene Riches, MD;  Location: [facility];  Service: Ophthalmology;  Laterality: Bilateral;  DIABETIC-INSULIN AND ORAL MEDS/CPAP   CARDIAC CATHETERIZATION     CARDIAC SURGERY     CARDIOVERSION N/A 07/26/2021   Procedure: CARDIOVERSION;  Surgeon: Lamar Blinks, MD;  Location: [facility] ORS;  Service: Cardiovascular;  Laterality: N/A;   CATARACT EXTRACTION W/PHACO Left 04/18/2021   Procedure: CATARACT EXTRACTION PHACO AND INTRAOCULAR LENS PLACEMENT (IOC) LEFT DIABETIC 16.56 01:30.8;  Surgeon: Galen Manila, MD;  Location: [facility];  Service: Ophthalmology;  Laterality: Left;  Diabetic   CATARACT EXTRACTION W/PHACO Right 05/02/2021   Procedure: CATARACT EXTRACTION PHACO AND INTRAOCULAR LENS PLACEMENT (IOC) RIGHT DIABETIC;  Surgeon: Galen Manila, MD;  Location: [facility];  Service: Ophthalmology;  Laterality: Right;  6.98 0:55.9    CHOLECYSTECTOMY     COLONOSCOPY WITH PROPOFOL N/A 12/26/2016   Procedure: COLONOSCOPY WITH PROPOFOL;  Surgeon: Toledo, Boykin Nearing, MD;  Location: [facility] ENDOSCOPY;  Service: Gastroenterology;  Laterality: N/A;   CORONARY ARTERY BYPASS GRAFT     X4   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   Procedure: DUPUYTREN CONTRACTURE RELEASE;  Surgeon: Christena Flake, MD;  Location: [facility] ORS;  Service: Orthopedics;  Laterality: Right;   ECTROPION REPAIR Bilateral 01/04/2015   Procedure: REPAIR OF ECTROPION EXTENSIVE;  Surgeon: Imagene Riches, MD;  Location: [facility];  Service: Ophthalmology;  Laterality: Bilateral;   ESOPHAGOGASTRODUODENOSCOPY (EGD) WITH PROPOFOL N/A 12/26/2016   Procedure: ESOPHAGOGASTRODUODENOSCOPY (EGD) WITH PROPOFOL;  Surgeon: Toledo, Boykin Nearing, MD;  Location: [facility] ENDOSCOPY;  Service: Gastroenterology;  Laterality: N/A;   ESOPHAGOGASTRODUODENOSCOPY (EGD) WITH PROPOFOL N/A 03/30/2020   Procedure: ESOPHAGOGASTRODUODENOSCOPY (EGD) WITH PROPOFOL;  Surgeon: Toledo, Boykin Nearing, MD;  Location: [facility] ENDOSCOPY;  Service: Gastroenterology;  Laterality: N/A;   HERNIA REPAIR Bilateral    groin   PTOSIS REPAIR Bilateral 01/04/2015   Procedure: PTOSIS REPAIR;  Surgeon: Imagene Riches, MD;  Location: [facility];  Service: Ophthalmology;  Laterality: Bilateral;   TOE SURGERY       Medications Prior to Admission: Prior to Admission medications   Medication Sig Start Date End Date Taking? Authorizing Provider  amiodarone (PACERONE) 200 MG tablet Take 200 mg by mouth at bedtime. 10/24/20  Yes [provider]  fluticasone Aleda Grana)  50 MCG/ACT nasal spray Place 1 spray into both nostrils daily.   Yes [provider]  gabapentin (NEURONTIN) 300 MG capsule Take 300 mg by mouth in the morning, at noon, and at bedtime. 06/28/20  Yes [provider]  glimepiride (AMARYL) 4 MG tablet Take 4 mg by mouth in the morning.   Yes [provider]  lovastatin  (MEVACOR) 40 MG tablet Take 40 mg by mouth every evening. 08/09/14  Yes [provider]  magnesium oxide (MAG-OX) 400 MG tablet Take 400 mg by mouth in the morning, at noon, and at bedtime.   Yes [provider]  meloxicam (MOBIC) 15 MG tablet Take 15 mg by mouth at bedtime. 10/23/20  Yes [provider]  metFORMIN (GLUCOPHAGE) 1000 MG tablet Take 1,000 mg by mouth in the morning and at bedtime. 09/27/14  Yes [provider]  metoprolol succinate (TOPROL-XL) 25 MG 24 hr tablet Take 25 mg by mouth at bedtime.   Yes [provider]  Multiple Vitamin (MULTIVITAMIN WITH MINERALS) TABS tablet Take 1 tablet by mouth in the morning.   Yes [provider]  omeprazole (PRILOSEC) 20 MG capsule Take 20 mg by mouth at bedtime. 10/24/14  Yes [provider]  PRADAXA 150 MG CAPS capsule Take 150 mg by mouth 2 (two) times daily. 10/28/14  Yes [provider]  PROAIR HFA 108 (90 BASE) MCG/ACT inhaler Inhale 2 puffs into the lungs See admin instructions. Inhale 1 puff scheduled every morning & may use every 6 hours if needed for wheezing/shortness of breath 08/30/14  Yes [provider]  QUEtiapine (SEROQUEL) 25 MG tablet Take 25 mg by mouth at bedtime. 03/02/21  Yes [provider]  Semaglutide (RYBELSUS) 14 MG TABS Take 14 mg by mouth in the morning.   Yes [provider]  tamsulosin (FLOMAX) 0.4 MG CAPS capsule Take 0.4 mg by mouth in the morning.   Yes [provider]  TRELEGY ELLIPTA 100-62.5-25 MCG/ACT AEPB Inhale 1 Inhalation into the lungs in the morning. 02/02/21  Yes [provider]     Allergies:    Allergies  Allergen Reactions   Neomycin-Polymyxin-Gramicidin Itching and Rash    When used topically     Social History:   Social History   Socioeconomic History   Marital status: Married    Spouse name: Not on file   Number of children: Not on file   Years of education: Not on file   Highest  education level: Not on file  Occupational History   Not on file  Tobacco Use   Smoking status: Former    Current packs/day: 0.00    Average packs/day: 2.0 packs/day for 20.0 years (40.0 ttl pk-yrs)    Types: Cigarettes    Start date: 03/31/1990    Quit date: 03/31/2010    Years since quitting: 12.6   Smokeless tobacco: Former  Building services engineer status: Never Used  Substance and Sexual Activity   Alcohol use: Not Currently    Alcohol/week: 10.0 standard drinks of alcohol    Types: 10 Glasses of wine per week    Comment: quit 10 years ago   Drug use: No   Sexual activity: Not on file  Other Topics Concern   Not on file  Social History Narrative   Lives with wife, Reida.   Social Determinants of Health   Financial Resource Strain: Not on file  Food Insecurity: Not on file  Transportation Needs: Not on file  Physical Activity: Not on file  Stress: Not on file  Social Connections: Not on file  Intimate Partner Violence: Not on file     Family History:   The patient's family history is not on file.    ROS:  Please see the history of present illness.  All other ROS reviewed and negative.     Physical Exam/Data:  There were no vitals filed for this visit. No intake or output data in the 24 hours ending 11/30/22 1123    02/26/2022    1:52 PM 08/15/2021   10:42 AM 07/26/2021    6:50 AM  Last 3 Weights  Weight (lbs) 250 lb 273 lb 11.2 oz 270 lb  Weight (kg) 113.399 kg 124.15 kg 122.471 kg     There is no height or weight on file to calculate BMI.  Wt Readings from Last 3 Encounters:  02/26/22 113.4 kg  08/15/21 124.1 kg  07/26/21 122.5 kg    Physical Exam: General: no acute distress. Head: Normocephalic, atraumatic  Neck: supple Lungs: nl effort Heart: irreg irreg, nl rate, nl s1/2, +systolic murmur Abdomen: Soft Msk:  Strength and tone appear normal for age. Extremities: warm, dry Neuro: awake and alert Psych:  Responds to questions appropriately with a  normal affect.    Laboratory Data:  ChemistryNo results for input(s): "NA", "K", "CL", "CO2", "GLUCOSE", "BUN", "CREATININE", "CALCIUM", "GFRNONAA", "GFRAA", "ANIONGAP" in the last 168 hours.  No results for input(s): "PROT", "ALBUMIN", "AST", "ALT", "ALKPHOS", "BILITOT" in the last 168 hours. HematologyNo results for input(s): "WBC", "RBC", "HGB", "HCT", "MCV", "MCH", "MCHC", "RDW", "PLT" in the last 168 hours.  Assessment and Plan   Will proceed w/ TEE DCCV  ASA III  Anesthesia to be administered by CRNA  Signed, Tiajuana Amass, MD 11/30/2022, 11:23 AM

## 2022-11-30 NOTE — Progress Notes (Signed)
*  PRELIMINARY RESULTS* Echocardiogram Echocardiogram Transesophageal has been performed.  Zachary Guerrero 11/30/2022, 12:43 PM

## 2022-11-30 NOTE — Transfer of Care (Signed)
Immediate Anesthesia Transfer of Care Note  Patient: Zachary Guerrero  Procedure(s) Performed: TRANSESOPHAGEAL ECHOCARDIOGRAM (TEE) CARDIOVERSION  Patient Location: PACU and special recoveries  Anesthesia Type:General  Level of Consciousness: awake, drowsy, and patient cooperative  Airway & Oxygen Therapy: Patient Spontanous Breathing and Patient connected to face mask oxygen  Post-op Assessment: Report given to RN and Post -op Vital signs reviewed and stable  Post vital signs: Reviewed and stable  Last Vitals:  Vitals Value Taken Time  BP 130/63 11/30/22 1238  Temp    Pulse 90 11/30/22 1240  Resp 21 11/30/22 1240  SpO2 97 % 11/30/22 1240    Last Pain:  Vitals:   11/30/22 1138  TempSrc: Oral  PainSc: 4          Complications: No notable events documented.

## 2022-12-03 NOTE — Anesthesia Postprocedure Evaluation (Signed)
Anesthesia Post Note  Patient: Zachary Guerrero  Procedure(s) Performed: TRANSESOPHAGEAL ECHOCARDIOGRAM (TEE) CARDIOVERSION  Patient location during evaluation: Specials Recovery Anesthesia Type: General Level of consciousness: awake and alert Pain management: pain level controlled Vital Signs Assessment: post-procedure vital signs reviewed and stable Respiratory status: spontaneous breathing, nonlabored ventilation, respiratory function stable and patient connected to nasal cannula oxygen Cardiovascular status: blood pressure returned to baseline and stable Postop Assessment: no apparent nausea or vomiting Anesthetic complications: no   No notable events documented.   Last Vitals:  Vitals:   11/30/22 1315 11/30/22 1330  BP: 132/60 138/60  Pulse: 91 89  Resp: (!) 29 (!) 21  Temp:    SpO2: 93% 93%    Last Pain:  Vitals:   11/30/22 1138  TempSrc: Oral  PainSc: 4                  Lenard Simmer

## 2022-12-04 ENCOUNTER — Encounter: Payer: Self-pay | Admitting: Anesthesiology

## 2022-12-04 ENCOUNTER — Encounter: Payer: Self-pay | Admitting: Ophthalmology

## 2022-12-04 NOTE — Anesthesia Preprocedure Evaluation (Addendum)
Anesthesia Evaluation    Airway Mallampati: III  TM Distance: <3 FB     Dental  (+) Poor Dentition, Missing, Chipped, Upper Dentures Chipped, Poor Dentition, Missing, Partial Upper, Dental Advidsory Given, Upper Dentures   :   Pulmonary former smoker          Cardiovascular hypertension,   PROCEDURE: The transesophogeal probe was passed without difficulty through  the esophogus of the patient. Sedation performed by different physician.  The patient was monitored while under deep sedation. Anesthestetic  sedation was provided intravenously by  Anesthesiology: 170mg  of Propofol. Image quality was good. The patient  developed no complications during the procedure. A successful direct  current cardioversion was performed at 200 joules with 1 attempt. See  separately dictated DCCV procedure note.    IMPRESSIONS     1. No thrombus within the LA or LAA.   2. Normal biventricular systolic function.   3. Mild mitral regurgitation.  Mild tricuspid regurgitation  FINDINGS   Left Ventricle: Left ventricular ejection fraction, by estimation, is 55  to 60%. The left ventricle has normal function. The left ventricle has no  regional wall motion abnormalities. The left ventricular internal cavity  size was normal in size.   Right Ventricle: The right ventricular size is normal. Right vetricular  wall thickness was not assessed. Right ventricular systolic function is  normal.   Left Atrium: Left atrial size was dilated by qualitative visual  assessment. No left atrial/left atrial appendage thrombus was detected.   Right Atrium: Right atrial size was normal in size.   Pericardium: There is no evidence of pericardial effusion.   Mitral Valve: MV leaflets are mildly thickened and restricited. MAC is  present. The mitral valve is abnormal. Mild mitral valve regurgitation.   Tricuspid Valve: The tricuspid valve is normal in structure.  Tricuspid  valve regurgitation is mild.   Aortic Valve: The aortic valve is normal in structure. Aortic valve  regurgitation is trivial. Aortic valve sclerosis is present, with no  evidence of aortic valve stenosis.   Pulmonic Valve: The pulmonic valve was normal in structure. Pulmonic valve  regurgitation is mild.   Aorta: The aortic root is normal in size and structure. There is moderate  (Grade III) plaque.   Venous: The left upper pulmonary vein, left lower pulmonary vein, right  upper pulmonary vein and right lower pulmonary vein are normal.   IAS/Shunts: No atrial level shunt detected by color flow Doppler.   Akshay Pendyal  Electronically signed by Tiajuana Amass  Signature Date/Time: 11/30/2022/2:13:28 PM         Neuro/Psych    GI/Hepatic   Endo/Other  diabetes    Renal/GU      Musculoskeletal   Abdominal   Peds  Hematology   Anesthesia Other Findings TEE, cardioversion 11-30-22 Patient thinks he had ablation, but according to all records, he only had TEE and cardioversion, not ablation.Has been on pradaxa. NOT able to come off Pradaxa.   Hx atrial fibrillation.    Diabetes mellitus without complication Coronary artery disease COPD (chronic obstructive pulmonary disease)  Sleep apnea Pneumonia  Shortness of breath dyspnea Hypercholesteremia  Reiter's syndrome Blepharoptosis  Hypertension Dysrhythmia  Wears dentures GERD (gastroesophageal reflux disease)History of kidney stones RLS (restless legs syndrome) Paroxysmal atrial fibrillation  Hx of CABG  Right bundle branch block History of cardioversion    Peripheral polyneuropathy      Reproductive/Obstetrics  Anesthesia Physical Anesthesia Plan  ASA: 3  Anesthesia Plan: MAC   Post-op Pain Management:    Induction: Intravenous  PONV Risk Score and Plan:   Airway Management Planned: Natural Airway and Nasal Cannula  Additional  Equipment:   Intra-op Plan:   Post-operative Plan:   Informed Consent: I have reviewed the patients History and Physical, chart, labs and discussed the procedure including the risks, benefits and alternatives for the proposed anesthesia with the patient or authorized representative who has indicated his/her understanding and acceptance.     Dental Advisory Given  Plan Discussed with: Anesthesiologist, CRNA and Surgeon  Anesthesia Plan Comments: (Patient consented for risks of anesthesia including but not limited to:  - adverse reactions to medications - damage to eyes, teeth, lips or other oral mucosa - nerve damage due to positioning  - sore throat or hoarseness - Damage to heart, brain, nerves, lungs, other parts of body or loss of life  Patient voiced understanding and assent.)         Anesthesia Quick Evaluation

## 2022-12-06 ENCOUNTER — Encounter: Payer: Self-pay | Admitting: Ophthalmology

## 2022-12-14 ENCOUNTER — Ambulatory Visit: Admission: RE | Admit: 2022-12-14 | Payer: Medicare PPO | Source: Home / Self Care | Admitting: Ophthalmology

## 2022-12-14 HISTORY — DX: Unspecified atrial fibrillation: I48.91

## 2022-12-14 HISTORY — DX: Personal history of other medical treatment: Z92.89

## 2022-12-14 HISTORY — DX: Paroxysmal atrial fibrillation: I48.0

## 2022-12-14 HISTORY — DX: Rheumatic tricuspid insufficiency: I07.1

## 2022-12-14 HISTORY — DX: Polyneuropathy, unspecified: G62.9

## 2022-12-14 HISTORY — DX: Unspecified right bundle-branch block: I45.10

## 2022-12-14 HISTORY — DX: Nonrheumatic mitral (valve) insufficiency: I34.0

## 2022-12-14 HISTORY — DX: Presence of aortocoronary bypass graft: Z95.1

## 2022-12-14 SURGERY — BLEPHAROPLASTY
Anesthesia: Monitor Anesthesia Care | Laterality: Bilateral

## 2023-02-06 ENCOUNTER — Ambulatory Visit: Payer: Medicare PPO | Admitting: Dermatology

## 2023-02-12 ENCOUNTER — Encounter: Payer: Self-pay | Admitting: Ophthalmology

## 2023-02-12 NOTE — Progress Notes (Signed)
 Called and discussed the patient's upcoming eye surgery on 1/17. Patient is also pending an ablation for his atrial fibrillation on 1/21. Patient states that he is taking medications to control it. Patient was seen by cardiology who cleared the patient for surgery.   Discussed risks of going under anesthesia with an arrhythmia vs being in a normal rhythm. Patient stated that he wanted to minimize the risks of anything happening to him and stated he would delay the eye surgery until after his ablation. Answered all of his other questions.

## 2023-02-21 ENCOUNTER — Ambulatory Visit: Payer: Medicare PPO | Admitting: Dermatology

## 2023-02-21 ENCOUNTER — Encounter: Payer: Self-pay | Admitting: Dermatology

## 2023-02-21 DIAGNOSIS — L820 Inflamed seborrheic keratosis: Secondary | ICD-10-CM

## 2023-02-21 DIAGNOSIS — I2510 Atherosclerotic heart disease of native coronary artery without angina pectoris: Secondary | ICD-10-CM | POA: Insufficient documentation

## 2023-02-21 DIAGNOSIS — Z83719 Family history of colon polyps, unspecified: Secondary | ICD-10-CM | POA: Insufficient documentation

## 2023-02-21 DIAGNOSIS — L720 Epidermal cyst: Secondary | ICD-10-CM | POA: Diagnosis not present

## 2023-02-21 DIAGNOSIS — J449 Chronic obstructive pulmonary disease, unspecified: Secondary | ICD-10-CM | POA: Insufficient documentation

## 2023-02-21 DIAGNOSIS — S129XXA Fracture of neck, unspecified, initial encounter: Secondary | ICD-10-CM | POA: Insufficient documentation

## 2023-02-21 DIAGNOSIS — N400 Enlarged prostate without lower urinary tract symptoms: Secondary | ICD-10-CM | POA: Insufficient documentation

## 2023-02-21 DIAGNOSIS — N200 Calculus of kidney: Secondary | ICD-10-CM | POA: Insufficient documentation

## 2023-02-21 NOTE — Progress Notes (Signed)
   Follow-Up Visit   Subjective  Zachary Guerrero is a 78 y.o. male who presents for the following: ISK f/u face, back, pt still has some areas that did not clear, check spot nose The patient has spots, moles and lesions to be evaluated, some may be new or changing and the patient may have concern these could be cancer.   The following portions of the chart were reviewed this encounter and updated as appropriate: medications, allergies, medical history  Review of Systems:  No other skin or systemic complaints except as noted in HPI or Assessment and Plan.  Objective  Well appearing patient in no apparent distress; mood and affect are within normal limits.   A focused examination was performed of the following areas: Face, back  Relevant exam findings are noted in the Assessment and Plan.  R face x 11, back/ flanks x 35 (46) Stuck on waxy paps with erythema  Assessment & Plan   EPIDERMAL INCLUSION CYST R upper back Exam: Subcutaneous nodule at R upper back  Benign-appearing. Exam most consistent with an epidermal inclusion cyst. Discussed that a cyst is a benign growth that can grow over time and sometimes get irritated or inflamed. Recommend observation if it is not bothersome. Discussed option of surgical excision to remove it if it is growing, symptomatic, or other changes noted. Please call for new or changing lesions so they can be evaluated.     INFLAMED SEBORRHEIC KERATOSIS (46) R face x 11, back/ flanks x 35 (46) Symptomatic, irritating, patient would like treated. Destruction of lesion - R face x 11, back/ flanks x 35 (46) Complexity: simple   Destruction method: cryotherapy   Informed consent: discussed and consent obtained   Timeout:  patient name, date of birth, surgical site, and procedure verified Lesion destroyed using liquid nitrogen: Yes   Region frozen until ice ball extended beyond lesion: Yes   Cryo cycles: 1 or 2. Outcome: patient tolerated procedure  well with no complications   Post-procedure details: wound care instructions given   EIC (EPIDERMAL INCLUSION CYST)    Return in about 6 months (around 08/21/2023) for ISK/AK f/u.  I, Ardis Rowan, RMA, am acting as scribe for Elie Goody, MD .   Documentation: I have reviewed the above documentation for accuracy and completeness, and I agree with the above.  Elie Goody, MD

## 2023-02-21 NOTE — Patient Instructions (Addendum)

## 2023-04-04 ENCOUNTER — Ambulatory Visit: Admission: RE | Admit: 2023-04-04 | Payer: Medicare PPO | Source: Home / Self Care | Admitting: Ophthalmology

## 2023-04-04 SURGERY — BLEPHAROPLASTY
Anesthesia: Monitor Anesthesia Care | Laterality: Bilateral

## 2023-05-21 ENCOUNTER — Encounter: Payer: Self-pay | Admitting: Anesthesiology

## 2023-05-21 ENCOUNTER — Encounter
Admission: RE | Disposition: A | Discharge status: Home / Self Care | Payer: Self-pay | Source: Home / Self Care | Attending: Internal Medicine

## 2023-05-21 ENCOUNTER — Encounter: Payer: Self-pay | Admitting: Internal Medicine

## 2023-05-21 ENCOUNTER — Ambulatory Visit
Admission: RE | Admit: 2023-05-21 | Discharge: 2023-05-21 | Disposition: A | Discharge status: Home / Self Care | Payer: Medicare PPO | Attending: Internal Medicine | Admitting: Internal Medicine

## 2023-05-21 DIAGNOSIS — Z539 Procedure and treatment not carried out, unspecified reason: Secondary | ICD-10-CM | POA: Insufficient documentation

## 2023-05-21 DIAGNOSIS — K219 Gastro-esophageal reflux disease without esophagitis: Secondary | ICD-10-CM | POA: Diagnosis present

## 2023-05-21 HISTORY — PX: COLONOSCOPY WITH PROPOFOL: SHX5780

## 2023-05-21 HISTORY — PX: ESOPHAGOGASTRODUODENOSCOPY (EGD) WITH PROPOFOL: SHX5813

## 2023-05-21 SURGERY — COLONOSCOPY WITH PROPOFOL
Anesthesia: General

## 2023-05-21 MED ORDER — SODIUM CHLORIDE 0.9 % IV SOLN: INTRAVENOUS | Status: DC

## 2023-05-21 NOTE — Anesthesia Preprocedure Evaluation (Addendum)
 Anesthesia Evaluation    Airway Mallampati: III       Dental   Pulmonary sleep apnea , COPD, former smoker (quit 2012)          Cardiovascular hypertension, + CAD (s/p CABG)  + dysrhythmias (a fib)   ECG 04/24/23: Atrial flutter  Right bundle branch block   Echo 04/22/23:  NORMAL LEFT VENTRICULAR SYSTOLIC FUNCTION WITH NO LVH  ESTIMATED EF: 55%  ELEVATED LA PRESSURES WITH DIASTOLIC DYSFUNCTION (GRADE 2)  NORMAL RIGHT VENTRICULAR SYSTOLIC FUNCTION  VALVULAR REGURGITATION: No AR, MILD MR, TRIVIAL PR, MILD TR  NO VALVULAR STENOSIS  MODERATELY ELEVATED ESTIMATED RVSP    Neuro/Psych  Neuromuscular disease (peripheral neuropathy)    GI/Hepatic   Endo/Other  diabetes, Type 2Hypothyroidism  Obesity   Renal/GU Renal disease (nephrolithiasis)     Musculoskeletal   Abdominal   Peds  Hematology   Anesthesia Other Findings Cardiology note 04/24/23:  IMPRESSION:  Recurrent atrial flutter typical vs atypical, (CTI vs mitral vs other) controlled rate.  Stroke prevention -on dabigatran-continue  Amiodarone -continue for now  RBBB Mild MR Other: OSA, HTN, HLD, type II DM, obesity  RECOMMENDATIONS and PLAN:   Jeraldine Molt he will send me tomorrow and next week  Will arrange DCCV at [facility] if remains in [facility]  Continue dabigatran  Continue Amiodarone   Return w me ~6 wks  Repeat ablation if recurs post DCCV if needed    Reproductive/Obstetrics                             Anesthesia Physical Anesthesia Plan  ASA: 3  Anesthesia Plan: General   Post-op Pain Management:    Induction:   PONV Risk Score and Plan:   Airway Management Planned:   Additional Equipment:   Intra-op Plan:   Post-operative Plan:   Informed Consent:   Plan Discussed with:   Anesthesia Plan Comments: (Case canceled in preop for incomplete prep.)        Anesthesia Quick Evaluation

## 2023-05-21 NOTE — Progress Notes (Signed)
 Patient arrived for his scheduled EGD/Colonoscopy with Dr Corky Diener today, but stated despite drinking all of his colon prep, he is still having brown liquid stool.  Per Dr Corky Diener, the patient is being rescheduled for his procedure until Wednesday, May 22, 2023.  I spoke with Brittney at Dr Nolene Baumgarten office and she will call in a new prescription for colon prep and I have instructed the patient to drink as much clear liquids as possible today and to drink clear liquids and complete his prep 4 hours prior to his arrival time tomorrow.  Brittney is also going to bring another set of written procedure preparation instructions for the patient.  The patient did verbalize understanding of the plan of care.

## 2023-05-22 ENCOUNTER — Encounter: Payer: Self-pay | Admitting: Internal Medicine

## 2023-05-22 ENCOUNTER — Ambulatory Visit

## 2023-05-22 ENCOUNTER — Ambulatory Visit
Admission: RE | Admit: 2023-05-22 | Discharge: 2023-05-22 | Disposition: A | Discharge status: Home / Self Care | Attending: Internal Medicine | Admitting: Internal Medicine

## 2023-05-22 ENCOUNTER — Other Ambulatory Visit: Payer: Self-pay

## 2023-05-22 ENCOUNTER — Encounter
Admission: RE | Disposition: A | Discharge status: Home / Self Care | Payer: Self-pay | Source: Home / Self Care | Attending: Internal Medicine

## 2023-05-22 DIAGNOSIS — G4730 Sleep apnea, unspecified: Secondary | ICD-10-CM | POA: Insufficient documentation

## 2023-05-22 DIAGNOSIS — I10 Essential (primary) hypertension: Secondary | ICD-10-CM | POA: Diagnosis not present

## 2023-05-22 DIAGNOSIS — J449 Chronic obstructive pulmonary disease, unspecified: Secondary | ICD-10-CM | POA: Insufficient documentation

## 2023-05-22 DIAGNOSIS — E119 Type 2 diabetes mellitus without complications: Secondary | ICD-10-CM | POA: Insufficient documentation

## 2023-05-22 DIAGNOSIS — Z87891 Personal history of nicotine dependence: Secondary | ICD-10-CM | POA: Insufficient documentation

## 2023-05-22 DIAGNOSIS — K648 Other hemorrhoids: Secondary | ICD-10-CM | POA: Diagnosis not present

## 2023-05-22 DIAGNOSIS — K219 Gastro-esophageal reflux disease without esophagitis: Secondary | ICD-10-CM | POA: Diagnosis not present

## 2023-05-22 DIAGNOSIS — I480 Paroxysmal atrial fibrillation: Secondary | ICD-10-CM | POA: Insufficient documentation

## 2023-05-22 DIAGNOSIS — K449 Diaphragmatic hernia without obstruction or gangrene: Secondary | ICD-10-CM | POA: Diagnosis not present

## 2023-05-22 DIAGNOSIS — K2970 Gastritis, unspecified, without bleeding: Secondary | ICD-10-CM | POA: Insufficient documentation

## 2023-05-22 DIAGNOSIS — K2270 Barrett's esophagus without dysplasia: Secondary | ICD-10-CM | POA: Diagnosis not present

## 2023-05-22 DIAGNOSIS — K5730 Diverticulosis of large intestine without perforation or abscess without bleeding: Secondary | ICD-10-CM | POA: Insufficient documentation

## 2023-05-22 DIAGNOSIS — Z1211 Encounter for screening for malignant neoplasm of colon: Secondary | ICD-10-CM | POA: Insufficient documentation

## 2023-05-22 DIAGNOSIS — Z951 Presence of aortocoronary bypass graft: Secondary | ICD-10-CM | POA: Diagnosis not present

## 2023-05-22 DIAGNOSIS — Z860101 Personal history of adenomatous and serrated colon polyps: Secondary | ICD-10-CM | POA: Diagnosis not present

## 2023-05-22 HISTORY — PX: ESOPHAGOGASTRODUODENOSCOPY: SHX5428

## 2023-05-22 HISTORY — PX: COLONOSCOPY: SHX5424

## 2023-05-22 LAB — GLUCOSE, CAPILLARY: Glucose-Capillary: 122 mg/dL — ABNORMAL HIGH (ref 70–99)

## 2023-05-22 SURGERY — EGD (ESOPHAGOGASTRODUODENOSCOPY)
Anesthesia: General

## 2023-05-22 MED ORDER — LIDOCAINE HCL (PF) 2 % IJ SOLN
INTRAMUSCULAR | Status: DC | PRN
  Administered 2023-05-22: 100 mg via INTRADERMAL

## 2023-05-22 MED ORDER — EPHEDRINE 5 MG/ML INJ
INTRAVENOUS | Status: AC
  Filled 2023-05-22: qty 5

## 2023-05-22 MED ORDER — PHENYLEPHRINE 80 MCG/ML (10ML) SYRINGE FOR IV PUSH (FOR BLOOD PRESSURE SUPPORT)
PREFILLED_SYRINGE | INTRAVENOUS | Status: DC | PRN
  Administered 2023-05-22: 80 ug via INTRAVENOUS
  Administered 2023-05-22 (×2): 160 ug via INTRAVENOUS

## 2023-05-22 MED ORDER — LIDOCAINE HCL (PF) 2 % IJ SOLN
INTRAMUSCULAR | Status: AC
  Filled 2023-05-22: qty 5

## 2023-05-22 MED ORDER — PHENYLEPHRINE 80 MCG/ML (10ML) SYRINGE FOR IV PUSH (FOR BLOOD PRESSURE SUPPORT)
PREFILLED_SYRINGE | INTRAVENOUS | Status: AC
  Filled 2023-05-22: qty 10

## 2023-05-22 MED ORDER — GLYCOPYRROLATE PF 0.2 MG/ML IJ SOSY
PREFILLED_SYRINGE | INTRAMUSCULAR | Status: DC | PRN
  Administered 2023-05-22: .1 mg via INTRAVENOUS

## 2023-05-22 MED ORDER — SODIUM CHLORIDE 0.9 % IV SOLN: INTRAVENOUS | Status: DC

## 2023-05-22 MED ORDER — EPHEDRINE SULFATE (PRESSORS) 50 MG/ML IJ SOLN
INTRAMUSCULAR | Status: DC | PRN
  Administered 2023-05-22: 5 mg via INTRAVENOUS
  Administered 2023-05-22: 10 mg via INTRAVENOUS

## 2023-05-22 MED ORDER — PROPOFOL 500 MG/50ML IV EMUL
INTRAVENOUS | Status: DC | PRN
  Administered 2023-05-22 (×6): 10 mg via INTRAVENOUS
  Administered 2023-05-22: 120 mg via INTRAVENOUS
  Administered 2023-05-22 (×16): 10 mg via INTRAVENOUS

## 2023-05-22 NOTE — Interval H&P Note (Signed)
 History and Physical Interval Note:  05/22/2023 9:47 AM  Zachary Guerrero  has presented today for surgery, with the diagnosis of GERD, Barrett's Esophagus without dysplasia; Personal history of colon polyps, unspecified.  The various methods of treatment have been discussed with the patient and family. After consideration of risks, benefits and other options for treatment, the patient has consented to  Procedure(s): EGD (ESOPHAGOGASTRODUODENOSCOPY) (N/A) COLONOSCOPY (N/A) as a surgical intervention.  The patient's history has been reviewed, patient examined, no change in status, stable for surgery.  I have reviewed the patient's chart and labs.  Questions were answered to the patient's satisfaction.     [name], [name]

## 2023-05-22 NOTE — Anesthesia Postprocedure Evaluation (Signed)
 Anesthesia Post Note  Patient: Zachary Guerrero  Procedure(s) Performed: EGD (ESOPHAGOGASTRODUODENOSCOPY) COLONOSCOPY  Patient location during evaluation: Endoscopy Anesthesia Type: General Level of consciousness: awake and alert Pain management: pain level controlled Vital Signs Assessment: post-procedure vital signs reviewed and stable Respiratory status: spontaneous breathing, nonlabored ventilation, respiratory function stable and patient connected to nasal cannula oxygen Cardiovascular status: blood pressure returned to baseline and stable Postop Assessment: no apparent nausea or vomiting Anesthetic complications: no   No notable events documented.   Last Vitals:  Vitals:   05/22/23 1104 05/22/23 1114  BP: 109/62 (!) 115/59  Pulse: 74 74  Resp: 12   Temp:    SpO2: 100% 97%    Last Pain:  Vitals:   05/22/23 1104  TempSrc:   PainSc: Asleep                 Portia Brittle Rhandi Despain

## 2023-05-22 NOTE — Anesthesia Preprocedure Evaluation (Signed)
 Anesthesia Evaluation  Patient identified by MRN, date of birth, ID band Patient awake    Reviewed: Allergy & Precautions, NPO status , Patient's Chart, lab work & pertinent test results  History of Anesthesia Complications Negative for: history of anesthetic complications  Airway Mallampati: III  TM Distance: <3 FB Neck ROM: full    Dental  (+) Chipped, Poor Dentition, Missing   Pulmonary shortness of breath and with exertion, sleep apnea , COPD, former smoker   Pulmonary exam normal        Cardiovascular Exercise Tolerance: Good hypertension, + CAD  Normal cardiovascular exam+ dysrhythmias      Neuro/Psych  Neuromuscular disease  negative psych ROS   GI/Hepatic Neg liver ROS,GERD  Controlled,,  Endo/Other  negative endocrine ROSdiabetes, Type 2    Renal/GU Renal disease  negative genitourinary   Musculoskeletal   Abdominal   Peds  Hematology negative hematology ROS (+)   Anesthesia Other Findings Past Medical History: No date: Atrial fibrillation (HCC) 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 No date: COPD (chronic obstructive pulmonary disease) (HCC) No date: Coronary artery disease No date: Diabetes mellitus without complication (HCC) No date: Dysrhythmia     Comment:  A-FIB/DR KOWALSKI No date: GERD (gastroesophageal reflux disease) No date: History of cardioversion No date: History of kidney stones No date: Hx of CABG No date: Hypercholesteremia No date: Hypertension     Comment:  CONTROLLED ON MEDS No date: Mild mitral regurgitation by prior echocardiogram No date: Mild tricuspid regurgitation by prior echocardiogram No date: Paroxysmal atrial fibrillation (HCC) No date: Peripheral polyneuropathy No date: Pneumonia     Comment:  IN PAST 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 No date: Right bundle branch block No date: RLS (restless legs syndrome) No date: Shortness of breath dyspnea     Comment:   WITH EXERTION No date: Sleep apnea     Comment:  C-PAP No date: Wears dentures     Comment:  UPPERS  Past Surgical History: No date: ARM SURGERY; Right     Comment:  FRACTURED 01/04/2015: BROW LIFT; Bilateral     Comment:  Procedure: BLEPHAROPLASTY;  Surgeon: Zacarias Hermann, MD;                Location: [facility];  Service: Ophthalmology;                Laterality: Bilateral;  DIABETIC-INSULIN AND ORAL               MEDS/CPAP No date: CARDIAC CATHETERIZATION No date: CARDIAC SURGERY 07/26/2021: CARDIOVERSION; N/A     Comment:  Procedure: CARDIOVERSION;  Surgeon: Michelle Aid,               MD;  Location: [facility] ORS;  Service: Cardiovascular;                Laterality: N/A; 11/30/2022: CARDIOVERSION; N/A     Comment:  Procedure: CARDIOVERSION;  Surgeon: Dorita Garter, MD;              Location: [facility] OR[facility];  Service: Cardiovascular;                Laterality: N/A; 04/18/2021: CATARACT EXTRACTION W/PHACO; Left     Comment:  Procedure: CATARACT EXTRACTION PHACO AND INTRAOCULAR               LENS PLACEMENT (IOC) LEFT DIABETIC 16.56 01:30.8;                Surgeon: Merrell Abate,  Sammie Crigler, MD;  Location: [facility];  Service: Ophthalmology;  Laterality: Left;                Diabetic 05/02/2021: CATARACT EXTRACTION W/PHACO; Right     Comment:  Procedure: CATARACT EXTRACTION PHACO AND INTRAOCULAR               LENS PLACEMENT (IOC) RIGHT DIABETIC;  Surgeon: Clair Crews, MD;  Location: [facility];  Service:               Ophthalmology;  Laterality: Right;  6.98 0:55.9 No date: CHOLECYSTECTOMY 12/26/2016: COLONOSCOPY WITH PROPOFOL ; N/A     Comment:  Procedure: COLONOSCOPY WITH PROPOFOL ;  Surgeon: Toledo,               Alphonsus Jeans, MD;  Location: [facility] END[facility];  Service:               Gastroenterology;  Laterality: N/A; 05/21/2023: COLONOSCOPY WITH PROPOFOL ; N/A     Comment:  Procedure: COLONOSCOPY WITH PROPOFOL ;  Surgeon: Toledo,                Alphonsus Jeans, MD;  Location: [facility] END[facility];  Service:               Gastroenterology;  Laterality: N/A; 2006: CORONARY ARTERY BYPASS GRAFT     Comment:  X4 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; Right     Comment:  Procedure: DUPUYTREN CONTRACTURE RELEASE;  Surgeon:               Elner Hahn, MD;  Location: [facility] OR[facility];  Service:               Orthopedics;  Laterality: Right; 01/04/2015: ECTROPION REPAIR; Bilateral     Comment:  Procedure: REPAIR OF ECTROPION EXTENSIVE;  Surgeon: Zacarias Hermann, MD;  Location: [facility];  Service:               Ophthalmology;  Laterality: Bilateral; 12/26/2016: ESOPHAGOGASTRODUODENOSCOPY (EGD) WITH PROPOFOL ; N/A     Comment:  Procedure: ESOPHAGOGASTRODUODENOSCOPY (EGD) WITH               PROPOFOL ;  Surgeon: Toledo, Alphonsus Jeans, MD;  Location:               [facility] END[facility];  Service: Gastroenterology;  Laterality:               N/A; 03/30/2020: ESOPHAGOGASTRODUODENOSCOPY (EGD) WITH PROPOFOL ; N/A     Comment:  Procedure: ESOPHAGOGASTRODUODENOSCOPY (EGD) WITH               PROPOFOL ;  Surgeon: Toledo, Alphonsus Jeans, MD;  Location:               [facility];  Service: Gastroenterology;  Laterality:               N/A; 05/21/2023: ESOPHAGOGASTRODUODENOSCOPY (EGD) WITH PROPOFOL ; N/A     Comment:  Procedure: ESOPHAGOGASTRODUODENOSCOPY (EGD) WITH               PROPOFOL ;  Surgeon: Toledo, Alphonsus Jeans, MD;  Location:               [facility] END[facility];  Service: Gastroenterology;  Laterality:  N/A; No date: HERNIA REPAIR; Bilateral     Comment:  groin 01/04/2015: PTOSIS REPAIR; Bilateral     Comment:  Procedure: PTOSIS REPAIR;  Surgeon: Zacarias Hermann, MD;                Location: [facility];  Service: Ophthalmology;                Laterality: Bilateral; 11/30/2022: TEE WITHOUT CARDIOVERSION; N/A     Comment:  Procedure: TRANSESOPHAGEAL ECHOCARDIOGRAM (TEE);                Surgeon: Dorita Garter, MD;  Location: [facility]  ORS;                Service: Cardiovascular;  Laterality: N/A; No date: TOE SURGERY  BMI    Body Mass Index: 35.27 kg/m      Reproductive/Obstetrics negative OB ROS                             Anesthesia Physical Anesthesia Plan  ASA: 3  Anesthesia Plan: General   Post-op Pain Management:    Induction: Intravenous  PONV Risk Score and Plan: Propofol  infusion and TIVA  Airway Management Planned: Natural Airway and Nasal Cannula  Additional Equipment:   Intra-op Plan:   Post-operative Plan:   Informed Consent: I have reviewed the patients History and Physical, chart, labs and discussed the procedure including the risks, benefits and alternatives for the proposed anesthesia with the patient or authorized representative who has indicated his/her understanding and acceptance.     Dental Advisory Given  Plan Discussed with: Anesthesiologist, CRNA and Surgeon  Anesthesia Plan Comments: (Patient consented for risks of anesthesia including but not limited to:  - adverse reactions to medications - risk of airway placement if required - damage to eyes, teeth, lips or other oral mucosa - nerve damage due to positioning  - sore throat or hoarseness - Damage to heart, brain, nerves, lungs, other parts of body or loss of life  Patient voiced understanding and assent.)       Anesthesia Quick Evaluation

## 2023-05-22 NOTE — Transfer of Care (Signed)
 Immediate Anesthesia Transfer of Care Note  Patient: Zachary Guerrero  Procedure(s) Performed: EGD (ESOPHAGOGASTRODUODENOSCOPY) COLONOSCOPY  Patient Location: Endoscopy Unit  Anesthesia Type:General  Level of Consciousness: drowsy  Airway & Oxygen Therapy: Patient Spontanous Breathing and Patient connected to face mask oxygen  Post-op Assessment: Report given to RN and Post -op Vital signs reviewed and stable  Post vital signs: Reviewed and stable  Last Vitals:  Vitals Value Taken Time  BP 91/52 05/22/23 1054  Temp 35.8 C 05/22/23 1054  Pulse 73 05/22/23 1054  Resp 11 05/22/23 1054  SpO2 95 % 05/22/23 1054    Last Pain:  Vitals:   05/22/23 1054  TempSrc: Temporal  PainSc: Asleep         Complications: No notable events documented.

## 2023-05-22 NOTE — Op Note (Signed)
 [facility] Gastroenterology Patient Name: Zachary Guerrero Procedure Date: 05/22/2023 10:10 AM MRN: [id number] Account #: [id number] Date of Birth: 1945-12-29 Admit Type: Outpatient Age: 78 Room: [facility] ENDO ROOM 2 Gender: Male Note Status: Finalized Instrument Name: Upper Endoscope [id number] Procedure:             Upper GI endoscopy Indications:           Surveillance for malignancy due to personal history of                         Barrett's esophagus, Gastro-esophageal reflux disease Providers:             Weber Monnier K. Astha Probasco MD, MD Medicines:             Propofol  per Anesthesia Complications:         No immediate complications. Estimated blood loss:                         Minimal. Procedure:             Pre-Anesthesia Assessment:                        - The risks and benefits of the procedure and the                         sedation options and risks were discussed with the                         patient. All questions were answered and informed                         consent was obtained.                        - Patient identification and proposed procedure were                         verified prior to the procedure by the nurse. The                         procedure was verified in the procedure room.                        - ASA Grade Assessment: III - A patient with severe                         systemic disease.                        - After reviewing the risks and benefits, the patient                         was deemed in satisfactory condition to undergo the                         procedure.                        After obtaining informed consent, the endoscope was  passed under direct vision. Throughout the procedure,                         the patient's blood pressure, pulse, and oxygen                         saturations were monitored continuously. The Endoscope                         was introduced through the mouth,  and advanced to the                         third part of duodenum. The upper GI endoscopy was                         accomplished without difficulty. The patient tolerated                         the procedure well. Findings:      There were esophageal mucosal changes secondary to established       long-segment Barrett's disease present in the lower third of the       esophagus. The maximum longitudinal extent of these mucosal changes was       5 cm in length. Mucosa was biopsied with a cold forceps for histology in       4 quadrants at intervals of 2 cm 5 cm proximal to the GE junction. A       total of 3 specimen bottles were sent to pathology. Estimated blood loss       was minimal.      A 1 cm hiatal hernia was present.      Patchy minimal inflammation characterized by congestion (edema) and       erythema was found in the gastric antrum.      The exam of the stomach was otherwise normal.      The examined duodenum was normal. Impression:            - Esophageal mucosal changes secondary to established                         long-segment Barrett's disease. Biopsied.                        - 1 cm hiatal hernia.                        - Gastritis.                        - Normal examined duodenum. Recommendation:        - Await pathology results.                        - Repeat upper endoscopy is not recommended for                         surveillance of Barrett's esophagus.                        - Proceed with colonoscopy Procedure Code(s):     --- Professional ---  78295, Esophagogastroduodenoscopy, flexible,                         transoral; with biopsy, single or multiple Diagnosis Code(s):     --- Professional ---                        K21.9, Gastro-esophageal reflux disease without                         esophagitis                        K29.70, Gastritis, unspecified, without bleeding                        K44.9, Diaphragmatic hernia without  obstruction or                         gangrene                        K22.70, Barrett's esophagus without dysplasia CPT copyright 2022 [employer]. All rights reserved. The codes documented in this report are preliminary and upon coder review may  be revised to meet current compliance requirements. Cassie Click MD, MD 05/22/2023 10:36:02 AM This report has been signed electronically. Number of Addenda: 0 Note Initiated On: 05/22/2023 10:10 AM Estimated Blood Loss:  Estimated blood loss was minimal.      [facility]

## 2023-05-22 NOTE — Op Note (Signed)
 [facility] Gastroenterology Patient Name: Zachary Guerrero Procedure Date: 05/22/2023 10:10 AM MRN: [id number] Account #: [id number] Date of Birth: 1945-06-29 Admit Type: Outpatient Age: 78 Room: [facility] ENDO ROOM 2 Gender: Male Note Status: Finalized Instrument Name: Hyman Main [id number] Procedure:             Colonoscopy Indications:           High risk colon cancer surveillance: Personal history                         of multiple (3 or more) adenomas Providers:             Davian Wollenberg K. Jemeka Wagler MD, MD Medicines:             Propofol  per Anesthesia Complications:         No immediate complications. Estimated blood loss: None. Procedure:             Pre-Anesthesia Assessment:                        - The risks and benefits of the procedure and the                         sedation options and risks were discussed with the                         patient. All questions were answered and informed                         consent was obtained.                        - Patient identification and proposed procedure were                         verified prior to the procedure by the nurse. The                         procedure was verified in the procedure room.                        - ASA Grade Assessment: III - A patient with severe                         systemic disease.                        - After reviewing the risks and benefits, the patient                         was deemed in satisfactory condition to undergo the                         procedure.                        After obtaining informed consent, the colonoscope was                         passed under direct vision. Throughout the procedure,  the patient's blood pressure, pulse, and oxygen                         saturations were monitored continuously. The                         Colonoscope was introduced through the anus and                         advanced to the the cecum,  identified by appendiceal                         orifice and ileocecal valve. The colonoscopy was                         performed without difficulty. The patient tolerated                         the procedure well. The quality of the bowel                         preparation was adequate. The ileocecal valve,                         appendiceal orifice, and rectum were photographed. Findings:      The perianal and digital rectal examinations were normal. Pertinent       negatives include normal sphincter tone and no palpable rectal lesions.      Non-bleeding internal hemorrhoids were found during retroflexion. The       hemorrhoids were Grade I (internal hemorrhoids that do not prolapse).      Multiple large-mouthed, medium-mouthed and small-mouthed diverticula       were found in the sigmoid colon. There was no evidence of diverticular       bleeding.      A tattoo was seen in the ascending colon. The tattoo site appeared       normal. Estimated blood loss: none.      The exam was otherwise without abnormality. Impression:            - Non-bleeding internal hemorrhoids.                        - Moderate diverticulosis in the sigmoid colon. There                         was no evidence of diverticular bleeding.                        - A tattoo was seen in the ascending colon. The tattoo                         site appeared normal.                        - The examination was otherwise normal.                        - No specimens collected. Recommendation:        - Repeat EGD for Barrett's for surveillance, interval  to be decided after pathology review.                        - If Esophageal biopsies are without dysplasia, would                         forego further surveillance given age and                         comorbidities.                        - Patient has a contact number available for                         emergencies. The signs and symptoms  of potential                         delayed complications were discussed with the patient.                         Return to normal activities tomorrow. Written                         discharge instructions were provided to the patient.                        - Resume previous diet.                        - Continue present medications.                        - No repeat colonoscopy due to current age (65 years                         or older) and the absence of colonic polyps.                        - You do NOT require further colon cancer screening                         measures (Annual stool testing (i.e. hemoccult, FIT,                         cologuard), sigmoidoscopy, colonoscopy or CT                         colonography). You should share this recommendation                         with your Primary Care provider.                        - Return to GI office PRN.                        - Resume Pradaxa (dabigatran) at prior dose today.  Refer to managing physician for further adjustment of                         therapy.                        - The findings and recommendations were discussed with                         the patient. Procedure Code(s):     --- Professional ---                        Z6109, Colorectal cancer screening; colonoscopy on                         individual at high risk Diagnosis Code(s):     --- Professional ---                        K57.30, Diverticulosis of large intestine without                         perforation or abscess without bleeding                        K64.0, First degree hemorrhoids                        Z86.010, Personal history of colonic polyps CPT copyright 2022 American [employer] Association. All rights reserved. The codes documented in this report are preliminary and upon coder review may  be revised to meet current compliance requirements. Cassie Click MD, MD 05/22/2023 10:55:25 AM This report  has been signed electronically. Number of Addenda: 0 Note Initiated On: 05/22/2023 10:10 AM Scope Withdrawal Time: 0 hours 6 minutes 2 seconds  Total Procedure Duration: 0 hours 11 minutes 41 seconds  Estimated Blood Loss:  Estimated blood loss: none.      [facility]

## 2023-05-22 NOTE — H&P (Signed)
 Outpatient short stay form Pre-procedure 05/22/2023 9:44 AM Zachary Guerrero, M.D.  Primary Physician: Shary Deems, M.D.  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 History of colon polyps-he is overdue for repeat colonoscopy. He denies any current GI symptoms except chronic constipation. We discussed in detail starting MiraLAX daily and needs to be moving bowels regular prior to bowel prep. He will notify me if the MiraLAX does not help symptoms and would consider Linzess, Amitiza, etc.  2. GERD-continue omeprazole 20 mg daily. Seems to control symptoms well. Diet modifications reviewed. He is due for repeat endoscopy for Barrett's February 2025, repeat EGD arranged.  He is scheduled to have a cardioversion for atrial fibs upcoming. Will send clearance to his cardiologist to hold Pradaxa prior to procedures. He will notify me with any changes to cardiac symptoms prior to procedure.  Wears CPAP  I reviewed the risks (including bleeding, perforation, infection, anesthesia complications, cardiac/respiratory complications), benefits and alternatives of EGD/Colonoscopy. Patient consents to proceed. Denies CP/SOB.     Current Facility-Administered Medications:    0.9 %  sodium chloride  infusion, , Intravenous, Continuous, Litzy Dicker K, MD  Medications Prior to Admission  Medication Sig Dispense Refill Last Dose/Taking   amiodarone (PACERONE) 200 MG tablet Take 200 mg by mouth at bedtime.      fluticasone (FLONASE) 50 MCG/ACT nasal spray Place 1 spray into both nostrils daily.      gabapentin (NEURONTIN) 300 MG capsule Take 300 mg by mouth in the morning, at noon, and at bedtime.      glimepiride (AMARYL) 4 MG tablet Take 4 mg by mouth in the morning.      lovastatin (MEVACOR) 40 MG tablet Take 40 mg by mouth every evening.  0    magnesium oxide (MAG-OX) 400 MG tablet Take 400 mg by mouth in the morning, at noon,  and at bedtime.      meloxicam (MOBIC) 15 MG tablet Take 15 mg by mouth at bedtime.      metFORMIN (GLUCOPHAGE) 1000 MG tablet Take 1,000 mg by mouth in the morning and at bedtime.  1    metoprolol succinate (TOPROL-XL) 25 MG 24 hr tablet Take 25 mg by mouth at bedtime.      Multiple Vitamin (MULTIVITAMIN WITH MINERALS) TABS tablet Take 1 tablet by mouth in the morning.      omeprazole (PRILOSEC) 20 MG capsule Take 20 mg by mouth at bedtime.  4    PRADAXA 150 MG CAPS capsule Take 150 mg by mouth 2 (two) times daily.      PROAIR HFA 108 (90 BASE) MCG/ACT inhaler Inhale 2 puffs into the lungs See admin instructions. Inhale 1 puff scheduled every morning & may use every 6 hours if needed for wheezing/shortness of breath      QUEtiapine (SEROQUEL) 25 MG tablet Take 25 mg by mouth at bedtime.      Semaglutide (RYBELSUS) 14 MG TABS Take 14 mg by mouth in the morning.      tamsulosin  (FLOMAX ) 0.4 MG CAPS capsule Take 0.4 mg by mouth in the morning.      TRELEGY ELLIPTA 100-62.5-25 MCG/ACT AEPB Inhale 1 Inhalation into the lungs in the morning.        Allergies  Allergen Reactions   Neomycin-Polymyxin-Gramicidin Itching and Rash    When used topically      Past Medical History:  Diagnosis Date   Atrial fibrillation (HCC)  Blepharoptosis    BILATERAL   COPD (chronic obstructive pulmonary disease) (HCC)    Coronary artery disease    Diabetes mellitus without complication (HCC)    Dysrhythmia    A-FIB/DR Bary Likes   GERD (gastroesophageal reflux disease)    History of cardioversion    History of kidney stones    Hx of CABG    Hypercholesteremia    Hypertension    CONTROLLED ON MEDS   Mild mitral regurgitation by prior echocardiogram    Mild tricuspid regurgitation by prior echocardiogram    Paroxysmal atrial fibrillation (HCC)    Peripheral polyneuropathy    Pneumonia    IN PAST   Reiter's syndrome    Right bundle branch block    RLS (restless legs syndrome)    Shortness of  breath dyspnea    WITH EXERTION   Sleep apnea    C-PAP   Wears dentures    UPPERS    Review of systems:  Otherwise negative.    Physical Exam  Gen: Alert, oriented. Appears stated age.  HEENT: [place]/AT. PERRLA. Lungs: CTA, no wheezes. CV: RR nl S1, S2. Abd: soft, benign, no masses. BS+ Ext: No edema. Pulses 2+    Planned procedures: Proceed with EGD and colonoscopy. The patient understands the nature of the planned procedure, indications, risks, alternatives and potential complications including but not limited to bleeding, infection, perforation, damage to internal organs and possible oversedation/side effects from anesthesia. The patient agrees and gives consent to proceed.  Please refer to procedure notes for findings, recommendations and patient disposition/instructions.     Meranda Dechaine K. Corky Guerrero, M.D. Gastroenterology 05/22/2023  9:44 AM

## 2023-05-23 ENCOUNTER — Encounter: Payer: Self-pay | Admitting: Internal Medicine

## 2023-05-23 LAB — SURGICAL PATHOLOGY

## 2023-06-06 ENCOUNTER — Encounter: Payer: Self-pay | Admitting: Ophthalmology

## 2023-06-10 NOTE — Discharge Instructions (Signed)

## 2023-06-14 ENCOUNTER — Other Ambulatory Visit: Payer: Self-pay

## 2023-06-14 ENCOUNTER — Ambulatory Visit: Payer: Self-pay | Admitting: Anesthesiology

## 2023-06-14 ENCOUNTER — Ambulatory Visit
Admission: RE | Admit: 2023-06-14 | Discharge: 2023-06-14 | Disposition: A | Discharge status: Home / Self Care | Attending: Ophthalmology | Admitting: Ophthalmology

## 2023-06-14 ENCOUNTER — Encounter: Payer: Self-pay | Admitting: Ophthalmology

## 2023-06-14 ENCOUNTER — Encounter
Admission: RE | Disposition: A | Discharge status: Home / Self Care | Payer: Self-pay | Source: Home / Self Care | Attending: Ophthalmology

## 2023-06-14 DIAGNOSIS — H02831 Dermatochalasis of right upper eyelid: Secondary | ICD-10-CM | POA: Diagnosis present

## 2023-06-14 DIAGNOSIS — G4730 Sleep apnea, unspecified: Secondary | ICD-10-CM | POA: Insufficient documentation

## 2023-06-14 DIAGNOSIS — Z87891 Personal history of nicotine dependence: Secondary | ICD-10-CM | POA: Diagnosis not present

## 2023-06-14 DIAGNOSIS — E119 Type 2 diabetes mellitus without complications: Secondary | ICD-10-CM | POA: Diagnosis not present

## 2023-06-14 DIAGNOSIS — I340 Nonrheumatic mitral (valve) insufficiency: Secondary | ICD-10-CM | POA: Insufficient documentation

## 2023-06-14 DIAGNOSIS — K219 Gastro-esophageal reflux disease without esophagitis: Secondary | ICD-10-CM | POA: Diagnosis not present

## 2023-06-14 DIAGNOSIS — I10 Essential (primary) hypertension: Secondary | ICD-10-CM | POA: Diagnosis not present

## 2023-06-14 DIAGNOSIS — H0289 Other specified disorders of eyelid: Secondary | ICD-10-CM | POA: Diagnosis not present

## 2023-06-14 DIAGNOSIS — I2510 Atherosclerotic heart disease of native coronary artery without angina pectoris: Secondary | ICD-10-CM | POA: Diagnosis not present

## 2023-06-14 DIAGNOSIS — Z7984 Long term (current) use of oral hypoglycemic drugs: Secondary | ICD-10-CM | POA: Insufficient documentation

## 2023-06-14 DIAGNOSIS — H02102 Unspecified ectropion of right lower eyelid: Secondary | ICD-10-CM | POA: Insufficient documentation

## 2023-06-14 DIAGNOSIS — M1990 Unspecified osteoarthritis, unspecified site: Secondary | ICD-10-CM | POA: Diagnosis not present

## 2023-06-14 DIAGNOSIS — H02834 Dermatochalasis of left upper eyelid: Secondary | ICD-10-CM | POA: Diagnosis present

## 2023-06-14 DIAGNOSIS — G709 Myoneural disorder, unspecified: Secondary | ICD-10-CM | POA: Diagnosis not present

## 2023-06-14 DIAGNOSIS — N289 Disorder of kidney and ureter, unspecified: Secondary | ICD-10-CM | POA: Diagnosis not present

## 2023-06-14 DIAGNOSIS — J449 Chronic obstructive pulmonary disease, unspecified: Secondary | ICD-10-CM | POA: Insufficient documentation

## 2023-06-14 DIAGNOSIS — H02132 Senile ectropion of right lower eyelid: Secondary | ICD-10-CM | POA: Insufficient documentation

## 2023-06-14 DIAGNOSIS — H02105 Unspecified ectropion of left lower eyelid: Secondary | ICD-10-CM | POA: Insufficient documentation

## 2023-06-14 HISTORY — DX: Nonrheumatic pulmonary valve insufficiency: I37.1

## 2023-06-14 HISTORY — PX: BROW LIFT: SHX178

## 2023-06-14 HISTORY — PX: ECTROPION REPAIR: SHX357

## 2023-06-14 LAB — GLUCOSE, CAPILLARY: Glucose-Capillary: 121 mg/dL — ABNORMAL HIGH (ref 70–99)

## 2023-06-14 SURGERY — BLEPHAROPLASTY
Anesthesia: Monitor Anesthesia Care | Site: Eye | Laterality: Bilateral

## 2023-06-14 MED ORDER — GLYCOPYRROLATE 0.2 MG/ML IJ SOLN
INTRAMUSCULAR | Status: AC
  Filled 2023-06-14: qty 1

## 2023-06-14 MED ORDER — PROPOFOL 500 MG/50ML IV EMUL
INTRAVENOUS | Status: DC | PRN
  Administered 2023-06-14: 140 ug/kg/min via INTRAVENOUS

## 2023-06-14 MED ORDER — FENTANYL CITRATE (PF) 100 MCG/2ML IJ SOLN
INTRAMUSCULAR | Status: DC | PRN
Start: 2023-06-14 — End: 2023-06-14
  Administered 2023-06-14 (×2): 25 ug via INTRAVENOUS

## 2023-06-14 MED ORDER — MIDAZOLAM HCL 2 MG/2ML IJ SOLN
INTRAMUSCULAR | Status: AC
  Filled 2023-06-14: qty 2

## 2023-06-14 MED ORDER — TETRACAINE HCL 0.5 % OP SOLN
OPHTHALMIC | Status: DC | PRN
Start: 2023-06-14 — End: 2023-06-14
  Administered 2023-06-14: 2 [drp] via OPHTHALMIC

## 2023-06-14 MED ORDER — TRAMADOL HCL 50 MG PO TABS: ORAL_TABLET | ORAL | 0 refills | Status: AC

## 2023-06-14 MED ORDER — HYALURONIDASE HUMAN 150 UNIT/ML IJ SOLN
INTRAMUSCULAR | Status: AC
  Filled 2023-06-14: qty 2

## 2023-06-14 MED ORDER — ERYTHROMYCIN 5 MG/GM OP OINT
TOPICAL_OINTMENT | OPHTHALMIC | Status: DC | PRN
  Administered 2023-06-14: 1 via OPHTHALMIC

## 2023-06-14 MED ORDER — GLYCOPYRROLATE 0.2 MG/ML IJ SOLN
INTRAMUSCULAR | Status: DC | PRN
Start: 2023-06-14 — End: 2023-06-14
  Administered 2023-06-14: .1 mg via INTRAVENOUS

## 2023-06-14 MED ORDER — ERYTHROMYCIN 5 MG/GM OP OINT: TOPICAL_OINTMENT | OPHTHALMIC | 2 refills | Status: AC

## 2023-06-14 MED ORDER — PROPOFOL 1000 MG/100ML IV EMUL
INTRAVENOUS | Status: AC
Start: 2023-06-14 — End: ?
  Filled 2023-06-14: qty 100

## 2023-06-14 MED ORDER — LIDOCAINE-EPINEPHRINE 2 %-1:100000 IJ SOLN
INTRAMUSCULAR | Status: DC | PRN
  Administered 2023-06-14: 1 mL via OPHTHALMIC
  Administered 2023-06-14: 8 mL via OPHTHALMIC

## 2023-06-14 MED ORDER — PROPOFOL 10 MG/ML IV BOLUS
INTRAVENOUS | Status: DC | PRN
  Administered 2023-06-14 (×2): 50 mg via INTRAVENOUS

## 2023-06-14 MED ORDER — BSS IO SOLN
INTRAOCULAR | Status: DC | PRN
  Administered 2023-06-14: 15 mL

## 2023-06-14 MED ORDER — ONDANSETRON HCL 4 MG/2ML IJ SOLN
INTRAMUSCULAR | Status: DC | PRN
Start: 2023-06-14 — End: 2023-06-14
  Administered 2023-06-14: 4 mg via INTRAVENOUS

## 2023-06-14 MED ORDER — PROPOFOL 10 MG/ML IV BOLUS
INTRAVENOUS | Status: AC
  Filled 2023-06-14: qty 20

## 2023-06-14 MED ORDER — MIDAZOLAM HCL 2 MG/2ML IJ SOLN
INTRAMUSCULAR | Status: DC | PRN
Start: 2023-06-14 — End: 2023-06-14
  Administered 2023-06-14: 2 mg via INTRAVENOUS

## 2023-06-14 MED ORDER — LIDOCAINE HCL (CARDIAC) PF 100 MG/5ML IV SOSY
PREFILLED_SYRINGE | INTRAVENOUS | Status: DC | PRN
Start: 2023-06-14 — End: 2023-06-14
  Administered 2023-06-14: 50 mg via INTRAVENOUS

## 2023-06-14 MED ORDER — LIDOCAINE HCL (PF) 2 % IJ SOLN
INTRAMUSCULAR | Status: AC
  Filled 2023-06-14: qty 5

## 2023-06-14 MED ORDER — ONDANSETRON HCL 4 MG/2ML IJ SOLN
INTRAMUSCULAR | Status: AC
  Filled 2023-06-14: qty 2

## 2023-06-14 MED ORDER — SODIUM CHLORIDE 0.9 % IV SOLN: INTRAVENOUS | Status: DC | PRN

## 2023-06-14 MED ORDER — FENTANYL CITRATE (PF) 100 MCG/2ML IJ SOLN
INTRAMUSCULAR | Status: AC
  Filled 2023-06-14: qty 2

## 2023-06-14 SURGICAL SUPPLY — 20 items
APPLICATOR COTTON TIP 3IN (MISCELLANEOUS) ×2 IMPLANT
BLADE SURG 15 STRL LF DISP TIS (BLADE) ×2 IMPLANT
CORD BIP STRL DISP 12FT (MISCELLANEOUS) ×2 IMPLANT
GAUZE SPONGE 2X2 STRL 8-PLY (GAUZE/BANDAGES/DRESSINGS) ×20 IMPLANT
GAUZE SPONGE 4X4 12PLY STRL (GAUZE/BANDAGES/DRESSINGS) ×2 IMPLANT
GLOVE SURG UNDER POLY LF SZ7 (GLOVE) ×4 IMPLANT
GOWN STRL REUS W/ TWL LRG LVL3 (GOWN DISPOSABLE) ×2 IMPLANT
MARKER SKIN XFINE TIP W/RULER (MISCELLANEOUS) ×2 IMPLANT
NDL FILTER BLUNT 18X1 1/2 (NEEDLE) ×2 IMPLANT
NDL HYPO 30X.5 LL (NEEDLE) ×4 IMPLANT
NEEDLE FILTER BLUNT 18X1 1/2 (NEEDLE) ×1 IMPLANT
NEEDLE HYPO 30X.5 LL (NEEDLE) ×2 IMPLANT
PACK ENT CUSTOM (PACKS) ×2 IMPLANT
SOLUTION PREP PVP 2OZ (MISCELLANEOUS) ×2 IMPLANT
SUT GUT PLAIN 6-0 1X18 ABS (SUTURE) ×2 IMPLANT
SUT MERSILENE 4-0 S-2 (SUTURE) IMPLANT
SUT VICRYL 6-0 S14 CTD (SUTURE) IMPLANT
SYR 10ML LL (SYRINGE) ×2 IMPLANT
SYR 3ML LL SCALE MARK (SYRINGE) ×2 IMPLANT
WATER STERILE IRR 250ML POUR (IV SOLUTION) ×2 IMPLANT

## 2023-06-14 NOTE — Interval H&P Note (Signed)
 History and Physical Interval Note:  06/14/2023 7:32 AM  Zachary Guerrero  has presented today for surgery, with the diagnosis of Bilateral Dermatochalasis Bilateral Ectropion.  The various methods of treatment have been discussed with the patient and family. After consideration of risks, benefits and other options for treatment, the patient has consented to  Procedure(s): BLEPHAROPLASTY (Bilateral) REPAIR, ECTROPION, EYELID (Bilateral) as a surgical intervention.  The patient's history has been reviewed, patient examined, no change in status, stable for surgery.  I have reviewed the patient's chart and labs.  Questions were answered to the patient's satisfaction.     Althea Atkinson, Shaneen Reeser M

## 2023-06-14 NOTE — Transfer of Care (Signed)
 Immediate Anesthesia Transfer of Care Note  Patient: Zachary Guerrero  Procedure(s) Performed: BLEPHAROPLASTY (Bilateral: Eye) REPAIR, ECTROPION, EYELID (Bilateral: Eye)  Patient Location: PACU  Anesthesia Type: MAC  Level of Consciousness: awake, alert  and patient cooperative  Airway and Oxygen Therapy: Patient Spontanous Breathing and Patient connected to supplemental oxygen  Post-op Assessment: Post-op Vital signs reviewed, Patient's Cardiovascular Status Stable, Respiratory Function Stable, Patent Airway and No signs of Nausea or vomiting  Post-op Vital Signs: Reviewed and stable  Complications: No notable events documented.

## 2023-06-14 NOTE — H&P (Signed)
 [facility]: [facility]  Primary Care Physician:  Yehuda Helms, MD Ophthalmologist: Dr. Vonna Guardian. Althea Atkinson, M.D.  Pre-Procedure History & Physical: HPI:  Zachary Guerrero is a 78 y.o. male here for periocular surgery.   Past Medical History:  Diagnosis Date   Atrial fibrillation (HCC)    Blepharoptosis    BILATERAL   COPD (chronic obstructive pulmonary disease) (HCC)    Coronary artery disease    Diabetes mellitus without complication (HCC)    Dysrhythmia    A-FIB/DR KOWALSKI   GERD (gastroesophageal reflux disease)    History of cardioversion    History of kidney stones    Hx of CABG    Hypercholesteremia    Hypertension    CONTROLLED ON MEDS   Mild mitral regurgitation by prior echocardiogram    Mild pulmonary valve regurgitation    Mild tricuspid regurgitation by prior echocardiogram    Paroxysmal atrial fibrillation (HCC)    Peripheral polyneuropathy    Pneumonia    IN PAST   Reiter's syndrome    Right bundle branch block    RLS (restless legs syndrome)    Shortness of breath dyspnea    WITH EXERTION   Sleep apnea    C-PAP   Wears dentures    UPPERS    Past Surgical History:  Procedure Laterality Date   ARM SURGERY Right    FRACTURED   BROW LIFT Bilateral 01/04/2015   Procedure: BLEPHAROPLASTY;  Surgeon: Zacarias Hermann, MD;  Location: [facility];  Service: Ophthalmology;  Laterality: Bilateral;  DIABETIC-INSULIN AND ORAL MEDS/CPAP   CARDIAC CATHETERIZATION     CARDIAC SURGERY     CARDIOVERSION N/A 07/26/2021   Procedure: CARDIOVERSION;  Surgeon: Michelle Aid, MD;  Location: [facility] ORS;  Service: Cardiovascular;  Laterality: N/A;   CARDIOVERSION N/A 11/30/2022   Procedure: CARDIOVERSION;  Surgeon: Dorita Garter, MD;  Location: [facility] ORS;  Service: Cardiovascular;  Laterality: N/A;   CATARACT EXTRACTION W/PHACO Left 04/18/2021   Procedure: CATARACT EXTRACTION PHACO AND INTRAOCULAR LENS PLACEMENT (IOC) LEFT DIABETIC 16.56 01:30.8;   Surgeon: Clair Crews, MD;  Location: [facility];  Service: Ophthalmology;  Laterality: Left;  Diabetic   CATARACT EXTRACTION W/PHACO Right 05/02/2021   Procedure: CATARACT EXTRACTION PHACO AND INTRAOCULAR LENS PLACEMENT (IOC) RIGHT DIABETIC;  Surgeon: Clair Crews, MD;  Location: [facility];  Service: Ophthalmology;  Laterality: Right;  6.98 0:55.9   CHOLECYSTECTOMY     COLONOSCOPY N/A 05/22/2023   Procedure: COLONOSCOPY;  Surgeon: Toledo, Alphonsus Jeans, MD;  Location: [facility] ENDOSCOPY;  Service: Gastroenterology;  Laterality: N/A;   COLONOSCOPY WITH PROPOFOL  N/A 12/26/2016   Procedure: COLONOSCOPY WITH PROPOFOL ;  Surgeon: Toledo, Alphonsus Jeans, MD;  Location: [facility] ENDOS[facility];  Service: Gastroenterology;  Laterality: N/A;   COLONOSCOPY WITH PROPOFOL  N/A 05/21/2023   Procedure: COLONOSCOPY WITH PROPOFOL ;  Surgeon: Toledo, Alphonsus Jeans, MD;  Location: [facility] ENDOSCOPY;  Service: Gastroenterology;  Laterality: N/A;   CORONARY ARTERY BYPASS GRAFT  2006   X4   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   Procedure: DUPUYTREN CONTRACTURE RELEASE;  Surgeon: Elner Hahn, MD;  Location: [facility] ORS;  Service: Orthopedics;  Laterality: Right;   ECTROPION REPAIR Bilateral 01/04/2015   Procedure: REPAIR OF ECTROPION EXTENSIVE;  Surgeon: Zacarias Hermann, MD;  Location: [facility];  Service: Ophthalmology;  Laterality: Bilateral;   ESOPHAGOGASTRODUODENOSCOPY N/A 05/22/2023   Procedure: EGD (ESOPHAGOGASTRODUODENOSCOPY);  Surgeon: Toledo, Alphonsus Jeans, MD;  Location: [facility] ENDOS[facility];  Service: Gastroenterology;  Laterality: N/A;   ESOPHAGOGASTRODUODENOSCOPY (  EGD) WITH PROPOFOL  N/A 12/26/2016   Procedure: ESOPHAGOGASTRODUODENOSCOPY (EGD) WITH PROPOFOL ;  Surgeon: Toledo, Alphonsus Jeans, MD;  Location: [facility];  Service: Gastroenterology;  Laterality: N/A;   ESOPHAGOGASTRODUODENOSCOPY (EGD) WITH PROPOFOL  N/A 03/30/2020   Procedure: ESOPHAGOGASTRODUODENOSCOPY (EGD) WITH PROPOFOL ;  Surgeon: Toledo,  Alphonsus Jeans, MD;  Location: [facility];  Service: Gastroenterology;  Laterality: N/A;   ESOPHAGOGASTRODUODENOSCOPY (EGD) WITH PROPOFOL  N/A 05/21/2023   Procedure: ESOPHAGOGASTRODUODENOSCOPY (EGD) WITH PROPOFOL ;  Surgeon: Toledo, Alphonsus Jeans, MD;  Location: [facility] ENDOS[facility];  Service: Gastroenterology;  Laterality: N/A;   HERNIA REPAIR Bilateral    groin   PTOSIS REPAIR Bilateral 01/04/2015   Procedure: PTOSIS REPAIR;  Surgeon: Zacarias Hermann, MD;  Location: [facility];  Service: Ophthalmology;  Laterality: Bilateral;   TEE WITHOUT CARDIOVERSION N/A 11/30/2022   Procedure: TRANSESOPHAGEAL ECHOCARDIOGRAM (TEE);  Surgeon: Dorita Garter, MD;  Location: [facility] ORS;  Service: Cardiovascular;  Laterality: N/A;   TOE SURGERY      Prior to Admission medications   Medication Sig Start Date End Date Taking? Authorizing Provider  fluticasone (FLONASE) 50 MCG/ACT nasal spray Place 1 spray into both nostrils daily.   Yes [provider]  gabapentin (NEURONTIN) 300 MG capsule Take 300 mg by mouth in the morning, at noon, and at bedtime. 06/28/20  Yes [provider]  glimepiride (AMARYL) 4 MG tablet Take 4 mg by mouth in the morning.   Yes [provider]  lovastatin (MEVACOR) 40 MG tablet Take 40 mg by mouth every evening. 08/09/14  Yes [provider]  magnesium oxide (MAG-OX) 400 MG tablet Take 400 mg by mouth in the morning, at noon, and at bedtime.   Yes [provider]  meloxicam (MOBIC) 15 MG tablet Take 15 mg by mouth at bedtime. 10/23/20  Yes [provider]  metFORMIN (GLUCOPHAGE) 1000 MG tablet Take 1,000 mg by mouth in the morning and at bedtime. 09/27/14  Yes [provider]  metoprolol succinate (TOPROL-XL) 25 MG 24 hr tablet Take 25 mg by mouth at bedtime.   Yes [provider]  Multiple Vitamin (MULTIVITAMIN WITH MINERALS) TABS tablet Take 1 tablet by mouth in the morning.   Yes [provider]  omeprazole  (PRILOSEC) 20 MG capsule Take 20 mg by mouth at bedtime. 10/24/14  Yes [provider]  PRADAXA 150 MG CAPS capsule Take 150 mg by mouth 2 (two) times daily. 10/28/14  Yes [provider]  PROAIR HFA 108 (90 BASE) MCG/ACT inhaler Inhale 2 puffs into the lungs See admin instructions. Inhale 1 puff scheduled every morning & may use every 6 hours if needed for wheezing/shortness of breath 08/30/14  Yes [provider]  QUEtiapine (SEROQUEL) 25 MG tablet Take 25 mg by mouth at bedtime. 03/02/21  Yes [provider]  Semaglutide (RYBELSUS) 14 MG TABS Take 14 mg by mouth in the morning.   Yes [provider]  tamsulosin  (FLOMAX ) 0.4 MG CAPS capsule Take 0.4 mg by mouth in the morning.   Yes [provider]  amiodarone (PACERONE) 200 MG tablet Take 200 mg by mouth at bedtime. Patient not taking: Reported on 06/06/2023 10/24/20   [provider]  Gaylan Kaufman 100-62.5-25 MCG/ACT AEPB Inhale 1 Inhalation into the lungs in the morning. Patient not taking: Reported on 06/14/2023 02/02/21   [provider]    Allergies as of 05/16/2023 - Review Complete 03/19/2023  Allergen Reaction Noted   Neomycin-polymyxin-gramicidin Itching and Rash 10/31/2014    History reviewed. No pertinent family  history.  Social History   Socioeconomic History   Marital status: Married    Spouse name: Not on file   Number of children: Not on file   Years of education: Not on file   Highest education level: Not on file  Occupational History   Not on file  Tobacco Use   Smoking status: Former    Current packs/day: 0.00    Average packs/day: 2.0 packs/day for 20.0 years (40.0 ttl pk-yrs)    Types: Cigarettes    Start date: 03/31/1990    Quit date: 03/31/2010    Years since quitting: 13.2   Smokeless tobacco: Former  Building services engineer status: Never Used  Substance and Sexual Activity   Alcohol use: Not Currently    Alcohol/week: 10.0 standard drinks of  alcohol    Types: 10 Glasses of wine per week    Comment: quit 10 years ago   Drug use: No   Sexual activity: Not on file  Other Topics Concern   Not on file  Social History Narrative   Lives with wife, Reida.   Social Drivers of Health   Financial Resource Strain: Patient Declined (03/24/2023)   Received from [facility]   Overall Financial Resource Strain (CARDIA)    Difficulty of Paying Living Expenses: Patient declined  Food Insecurity: Patient Declined (03/24/2023)   Received from [facility]   Hunger Vital Sign    Worried About Running Out of Food in the Last Year: Patient declined    Ran Out of Food in the Last Year: Patient declined  Transportation Needs: Patient Declined (03/24/2023)   Received from [facility] - Transportation    In the past 12 months, has lack of transportation kept you from medical appointments or from getting medications?: Patient declined    Lack of Transportation (Non-Medical): Patient declined  Physical Activity: Not on file  Stress: Not on file  Social Connections: Not on file  Intimate Partner Violence: Not on file    Review of Systems: See HPI, otherwise negative ROS  Physical Exam: BP 136/63   Temp 97.8 F (36.6 C) (Temporal)   Ht 5\' 10"  (1.778 m)   Wt 112.7 kg   SpO2 96%   BMI 35.66 kg/m  General:   Alert and cooperative in NAD Head:  Normocephalic and atraumatic. Respiratory:  Normal work of breathing.  Impression/Plan: Dottie Gearing is here for periocular surgery.  Risks, benefits, limitations, and alternatives regarding surgery have been reviewed with the patient.  Questions have been answered.  All parties agreeable.   Zacarias Hermann, MD  06/14/2023, 7:32 AM

## 2023-06-14 NOTE — Addendum Note (Signed)
 Addendum  created 06/14/23 1035 by Sherrlyn Dolores, CRNA   Flowsheet accepted

## 2023-06-14 NOTE — Anesthesia Postprocedure Evaluation (Signed)
 Anesthesia Post Note  Patient: Zachary Guerrero  Procedure(s) Performed: BLEPHAROPLASTY (Bilateral: Eye) REPAIR, ECTROPION, EYELID (Bilateral: Eye)  Patient location during evaluation: PACU Anesthesia Type: MAC Level of consciousness: awake and alert Pain management: pain level controlled Vital Signs Assessment: post-procedure vital signs reviewed and stable Respiratory status: spontaneous breathing, nonlabored ventilation, respiratory function stable and patient connected to nasal cannula oxygen Cardiovascular status: blood pressure returned to baseline and stable Postop Assessment: no apparent nausea or vomiting Anesthetic complications: no   No notable events documented.   Last Vitals:  Vitals:   06/14/23 0925 06/14/23 0930  BP: 110/61 105/61  Pulse: 73 72  Resp: 10 11  Temp:    SpO2: 94% 96%    Last Pain:  Vitals:   06/14/23 0930  TempSrc:   PainSc: 0-No pain                 Zachary Guerrero

## 2023-06-14 NOTE — Op Note (Signed)
 Preoperative Diagnosis:   1.  Lower eyelid laxity with ectropion, bilateral  lower eyelid(s). 2.  Visually significant dermatochalasis both  Upper Eyelid(s)  Postoperative Diagnosis:   Same.  Procedure(s) Performed:  1.  Lateral tarsal strip procedure,  bilateral   lower eyelid(s). 2.  Upper eyelid blepharoplasty with excess skin excision  both  Upper Eyelid(s)  Surgeon: Vonna Guardian. Althea Atkinson, M.D.  Assistants: none  Anesthesia: MAC  Specimens: None.  Estimated Blood Loss: Minimal.  Complications: None.  Operative Findings: Very lax an boggy tissues with poor conjunctival attachments into the upper fornix.  The upper lid kept trying to spontaneously evert  Procedure:   Allergies were reviewed and the patient Lipitor [atorvastatin calcium].    After discussing the risks, benefits, complications, and alternatives with the patient, appropriate informed consent was obtained. The patient was brought to the operating suite and reclined supine. Time out was conducted and the patient was sedated.  Local anesthetic consisting of a 50-50 mixture of 2% lidocaine  with epinephrine  and 0.75% bupivacaine  with added Hylenex was injected subcutaneously  to both  upper eyelid(s) and to the bilateral  lateral canthal region(s) and lower eyelid(s). Additional anesthetic was injected subconjunctivally to the bilateral  lower eyelid(s). Finally, anesthetic was injected down to the periosteum of the bilateral  lateral orbital rim(s).  After adequate local was instilled, the patient was prepped and draped in the usual sterile fashion for eyelid surgery.   Attention was turned to the upper eyelids. A 8mm upper eyelid crease incision line was marked with calipers on both  upper eyelid(s).  A pinch test was used to estimate the amount of excess skin to remove and this was marked in standard blepharoplasty style fashion. Attention was turned to the  right  upper eyelid. A #15 blade was used to open the premarked  incision line. A Skin and muscle flap was excised and hemostasis was obtained with bipolar cautery. A buttonhole was created in orbital septum to reveal the central and medial fat pockets. These were dissected free from fascial attachments, cauterized towards the pedicle base and excised .  Attention was then turned to the opposite eyelid where the same procedure was performed in the same manner.  The incisions were closed with interrupted and running 6-0 plain gut suture.  Attention was turned to the right  lateral canthal angle. Westcott scissors were used to create a lateral canthotomy. Hemostasis was obtained with bipolar cautery. An inferior cantholysis was then performed with additional bipolar hemostasis. The anterior and posterior lamella of the lid were divided for approximately 8 mm.  A strip of the epithelium was excised off the superior margin of the tarsal strip and conjunctiva and retractors were incised off the inferior margin of the tarsal strip.   Two double-armed 4-0 chromic sutures were passed medially and laterally through the lower lid from the conjunctival fornix, anteriorly through the skin just below the border of the tarsal plate. Each of these sutures was tied off and this created a nice outward rolling of the lid margin.  A double-armed 4-0 Mersilene suture was then passed each arm through the terminal portion of the tarsal strip. Each arm of the suture was then passed through the periosteum of the inner portion of the lateral orbital rim at the level of Whitnall's tubercle. The sutures were advanced and this provided nice elevation and tightening of the lower eyelid. Once the suture was secured, a thin strip of follicle-bearing skin was excised. A buttonhole was created  orbicularis and orbital septum to reveal the very large lateral lower eyelid fat pocket. This was dissected free from fascial attachments, cauterized towards the pedicle base and excised to produce debulking of  the lateral portion of the lower eyelid. The lateral canthal angle was reformed with an interrupted 6-0 vicryl suture. Orbicularis was reapproximated with horizontal subcuticular 6-0  vicryl sutures. The skin was closed with interrupted 6-0 vicryl sutures.   Attention was then turned to the opposite eyelid where the same procedure was performed in the same manner.   The patient tolerated the procedure well. Erythromycin ophthalmic ointment was applied to the incision site(s) followed by ice packs. The patient was taken to the recovery area where she recovered without difficulty.  Post-Op Plan/Instructions:  The patient was instructed to use ice packs frequently for the next 48 hours. She was instructed to use Erythromycin ophthalmic ointment on her incisions 4 times a day for the next 12 to 14 days. She was given a prescription for tramadol (or similar) for pain control should Tylenol  not be effective. She was asked to to follow up at the [facility] in [place], [place] in 3-4 weeks' time or sooner as needed for problems.   [employer] Flight M. Althea Atkinson, M.D. Ophthalmology

## 2023-06-14 NOTE — Anesthesia Preprocedure Evaluation (Addendum)
 Anesthesia Evaluation  Patient identified by MRN, date of birth, ID band Patient awake    Reviewed: Allergy & Precautions, H&P , NPO status , Patient's Chart, lab work & pertinent test results  Airway Mallampati: III  TM Distance: >3 FB Neck ROM: Full    Dental no notable dental hx. (+) Partial Upper   Pulmonary shortness of breath, sleep apnea , pneumonia, COPD, former smoker   Pulmonary exam normal breath sounds clear to auscultation       Cardiovascular hypertension, + CAD  Normal cardiovascular exam+ dysrhythmias  Rhythm:Regular Rate:Normal  11-30-22 echo No thrombus within the LA or LAA.   2. Normal biventricular systolic function.   3. Mild mitral regurgitation.   FINDINGS   Left Ventricle: Left ventricular ejection fraction, by estimation, is 55  to 60%. The left ventricle has normal function. The left ventricle has no  regional wall motion abnormalities. The left ventricular internal cavity  size was normal in size.   Right Ventricle: The right ventricular size is normal. Right vetricular  wall thickness was not assessed. Right ventricular systolic function is  normal.   Left Atrium: Left atrial size was dilated by qualitative visual  assessment. No left atrial/left atrial appendage thrombus was detected.   Right Atrium: Right atrial size was normal in size.   TTE 02/19/23 LVEF 55 MILD MR, MILD PR, MILD TR MODERATELY ELEVATED ESTIMATED RVSP MILD LAE (38 cc/m2)  TTE 07/2021 INTERPRETATION NORMAL LEFT VENTRICULAR SYSTOLIC FUNCTION     Neuro/Psych  Neuromuscular disease negative neurological ROS  negative psych ROS   GI/Hepatic negative GI ROS, Neg liver ROS,GERD  ,,  Endo/Other  diabetes    Renal/GU Renal diseasenegative Renal ROS  negative genitourinary   Musculoskeletal negative musculoskeletal ROS (+) Arthritis ,    Abdominal   Peds negative pediatric ROS (+)  Hematology negative hematology  ROS (+)   Anesthesia Other Findings Medical History  Diabetes mellitus without complication Coronary artery disease COPD (chronic obstructive pulmonary disease)   Sleep apnea Pneumonia  Shortness of breath dyspnea Hypercholesteremia Reiter's syndrome Blepharoptosis  Hypertension Dysrhythmia  Wears dentures GERD (gastroesophageal reflux disease) History of kidney stones RLS (restless legs syndrome) Paroxysmal atrial fibrillation (HCC) Hx of CABG  Right bundle branch block History of cardioversion  Mild mitral regurgitation by prior echocardiogram Mild tricuspid regurgitation by prior echocardiogram  Peripheral polyneuropathy Atrial fibrillation (HCC)     Reproductive/Obstetrics negative OB ROS                             Anesthesia Physical Anesthesia Plan  ASA: 3  Anesthesia Plan: General   Post-op Pain Management:    Induction: Intravenous  PONV Risk Score and Plan:   Airway Management Planned: Natural Airway and Nasal Cannula  Additional Equipment:   Intra-op Plan:   Post-operative Plan:   Informed Consent: I have reviewed the patients History and Physical, chart, labs and discussed the procedure including the risks, benefits and alternatives for the proposed anesthesia with the patient or authorized representative who has indicated his/her understanding and acceptance.     Dental Advisory Given  Plan Discussed with: Anesthesiologist, CRNA and Surgeon  Anesthesia Plan Comments: (Patient consented for risks of anesthesia including but not limited to:  - adverse reactions to medications - damage to eyes, teeth, lips or other oral mucosa - nerve damage due to positioning  - sore throat or hoarseness - Damage to heart, brain, nerves, lungs, other parts of  body or loss of life  Patient voiced understanding and assent.)        Anesthesia Quick Evaluation

## 2023-08-13 ENCOUNTER — Ambulatory Visit: Admitting: Dermatology

## 2023-08-13 ENCOUNTER — Encounter: Payer: Self-pay | Admitting: Dermatology

## 2023-08-13 DIAGNOSIS — D099 Carcinoma in situ, unspecified: Secondary | ICD-10-CM

## 2023-08-13 DIAGNOSIS — D0471 Carcinoma in situ of skin of right lower limb, including hip: Secondary | ICD-10-CM | POA: Diagnosis not present

## 2023-08-13 DIAGNOSIS — D485 Neoplasm of uncertain behavior of skin: Secondary | ICD-10-CM

## 2023-08-13 DIAGNOSIS — D492 Neoplasm of unspecified behavior of bone, soft tissue, and skin: Secondary | ICD-10-CM

## 2023-08-13 HISTORY — DX: Carcinoma in situ, unspecified: D09.9

## 2023-08-13 NOTE — Progress Notes (Signed)
   Follow-Up Visit   Subjective  Zachary Guerrero is a 78 y.o. male who presents for the following: Irregular skin lesion on the R lower leg, has been present for months and will not resolve. Pt states not painful or any other symptoms. He is diabetic. Pt doesn't recall trauma to the area or bug bites.  The following portions of the chart were reviewed this encounter and updated as appropriate: medications, allergies, medical history  Review of Systems:  No other skin or systemic complaints except as noted in HPI or Assessment and Plan.  Objective  Well appearing patient in no apparent distress; mood and affect are within normal limits.   A focused examination was performed of the following areas: the R lower leg   Relevant exam findings are noted in the Assessment and Plan.  R lower leg 2.5 x 1.9 cm Circular erythematous plaque with focal hyperkeratosis.  Assessment & Plan        NEOPLASM OF UNCERTAIN BEHAVIOR OF SKIN R lower leg Skin / nail biopsy Type of biopsy: tangential   Informed consent: discussed and consent obtained   Timeout: patient name, date of birth, surgical site, and procedure verified   Procedure prep:  Patient was prepped and draped in usual sterile fashion Prep type:  Isopropyl alcohol Anesthesia: the lesion was anesthetized in a standard fashion   Anesthetic:  1% lidocaine  w/ epinephrine  1-100,000 buffered w/ 8.4% NaHCO3 Instrument used: flexible razor blade   Hemostasis achieved with: pressure, aluminum chloride and electrodesiccation   Outcome: patient tolerated procedure well   Post-procedure details: sterile dressing applied and wound care instructions given   Dressing type: bandage (Mupirocin 2% ointment)    Specimen 1 - Surgical pathology Differential Diagnosis: D48.5 Psoriasis vs stasis dermatitis vs acroangiodermatitis vs diabetic dermopathy vs pigmented purpura vs SCC vs Chromoblastomycosis Psoriasis vs stasis dermatitis vs  acroangiodermatitis vs diabetic dermopathy vs pigmented purpura vs SCC  Return for appointment as scheduled.  LILLETTE Rosina Mayans, CMA, am acting as scribe for Boneta Sharps, MD .   Documentation: I have reviewed the above documentation for accuracy and completeness, and I agree with the above.  Boneta Sharps, MD

## 2023-08-13 NOTE — Patient Instructions (Addendum)

## 2023-08-16 ENCOUNTER — Ambulatory Visit: Payer: Self-pay | Admitting: Dermatology

## 2023-08-16 LAB — SURGICAL PATHOLOGY

## 2023-08-19 NOTE — Telephone Encounter (Signed)
LMOVM for patient to CB 

## 2023-08-19 NOTE — Telephone Encounter (Signed)
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   Explanation: Biopsy shows a squamous cell skin cancer limited to the top layer of skin. This means it is an early cancer and has not spread. However, it has the potential to spread beyond the skin  and threaten your health, so we recommend treating it.   Treatment option 1: a cream (fluorouracil  and calcipotriene) that helps your immune system clear the skin cancer. It will cause redness and irritation. Wait two weeks after the biopsy to start  applying the cream. Apply the cream twice per day until the redness and irritation develop (usually occurs by day 7), then stop and allow it to heal. We will recheck the area in 2 months to ensure  the cancer is gone. The cream is $45 plus shipping and will be mailed to you from a low cost compounding pharmacy.  Treatment option 2: Mohs surgery, which involves cutting out right around the skin cancer and then checking under the microscope on the same day to ensure the whole skin cancer is out. If there is  more cancer remaining, the surgeon will repeat the process until it is fully cleared. The cure rate is about 98-99%. It is done at another office outside of [place] ([place], [place], or  [place]). Once the Mohs surgeon confirms the skin cancer is out, they will discuss the options to repair or heal the area. You must take it easy for about two weeks after surgery (no lifting over  10-15 lbs, avoid activity to get your heart rate and blood pressure up). ----- Message ----- From: Interface, Lab In Three Zero One Sent: 08/16/2023   5:31 PM EDT To: Boneta Sharps, MD

## 2023-08-20 ENCOUNTER — Encounter: Payer: Self-pay | Admitting: Dermatology

## 2023-08-20 MED ORDER — FLUOROURACIL 5 % EX CREA: TOPICAL_CREAM | CUTANEOUS | 0 refills | Status: AC

## 2023-08-20 NOTE — Addendum Note (Signed)
 Addended by: TERESA PALMA R on: 08/20/2023 12:37 PM   Modules accepted: Orders

## 2023-08-20 NOTE — Telephone Encounter (Signed)
 Patient has been advised of BX results.  He would like to treat with option 1 at this time and will keep his follow up with Dr. Hester in 1 month. aw

## 2023-09-18 ENCOUNTER — Encounter: Payer: Self-pay | Admitting: Dermatology

## 2023-09-18 ENCOUNTER — Ambulatory Visit: Payer: Medicare PPO | Admitting: Dermatology

## 2023-09-18 DIAGNOSIS — L578 Other skin changes due to chronic exposure to nonionizing radiation: Secondary | ICD-10-CM | POA: Diagnosis not present

## 2023-09-18 DIAGNOSIS — D485 Neoplasm of uncertain behavior of skin: Secondary | ICD-10-CM

## 2023-09-18 DIAGNOSIS — L711 Rhinophyma: Secondary | ICD-10-CM

## 2023-09-18 DIAGNOSIS — L570 Actinic keratosis: Secondary | ICD-10-CM | POA: Diagnosis not present

## 2023-09-18 DIAGNOSIS — L814 Other melanin hyperpigmentation: Secondary | ICD-10-CM | POA: Diagnosis not present

## 2023-09-18 DIAGNOSIS — Z1283 Encounter for screening for malignant neoplasm of skin: Secondary | ICD-10-CM | POA: Diagnosis not present

## 2023-09-18 DIAGNOSIS — D1801 Hemangioma of skin and subcutaneous tissue: Secondary | ICD-10-CM

## 2023-09-18 DIAGNOSIS — Z7189 Other specified counseling: Secondary | ICD-10-CM

## 2023-09-18 DIAGNOSIS — Z8589 Personal history of malignant neoplasm of other organs and systems: Secondary | ICD-10-CM

## 2023-09-18 DIAGNOSIS — D229 Melanocytic nevi, unspecified: Secondary | ICD-10-CM

## 2023-09-18 DIAGNOSIS — Z79899 Other long term (current) drug therapy: Secondary | ICD-10-CM

## 2023-09-18 DIAGNOSIS — W908XXA Exposure to other nonionizing radiation, initial encounter: Secondary | ICD-10-CM | POA: Diagnosis not present

## 2023-09-18 DIAGNOSIS — B353 Tinea pedis: Secondary | ICD-10-CM

## 2023-09-18 DIAGNOSIS — D492 Neoplasm of unspecified behavior of bone, soft tissue, and skin: Secondary | ICD-10-CM

## 2023-09-18 DIAGNOSIS — L820 Inflamed seborrheic keratosis: Secondary | ICD-10-CM

## 2023-09-18 DIAGNOSIS — D692 Other nonthrombocytopenic purpura: Secondary | ICD-10-CM

## 2023-09-18 DIAGNOSIS — L719 Rosacea, unspecified: Secondary | ICD-10-CM

## 2023-09-18 MED ORDER — METRONIDAZOLE 0.75 % EX CREA: TOPICAL_CREAM | CUTANEOUS | 11 refills | Status: AC

## 2023-09-18 MED ORDER — MUPIROCIN 2 % EX OINT
TOPICAL_OINTMENT | CUTANEOUS | 1 refills | Status: AC
Start: 2023-09-18 — End: ?

## 2023-09-18 MED ORDER — KETOCONAZOLE 2 % EX CREA: TOPICAL_CREAM | CUTANEOUS | 11 refills | Status: AC

## 2023-09-18 NOTE — Patient Instructions (Addendum)
 Cryotherapy Aftercare  Wash gently with soap and water everyday.   Apply Vaseline Jelly daily until healed.     Wound Care Instructions  Cleanse wound gently with soap and water once a day then pat dry with clean gauze. Apply a thin coat of Mupirocin . We recommend that you use a new, sterile tube of Vaseline. Do not pick or remove scabs. Do not remove the yellow or white healing tissue from the base of the wound.  Cover the wound with fresh, clean, nonstick gauze and secure with paper tape. You may use Band-Aids in place of gauze and tape if the wound is small enough, but would recommend trimming much of the tape off as there is often too much. Sometimes Band-Aids can irritate the skin.  You should call the office for your biopsy report after 1 week if you have not already been contacted.  If you experience any problems, such as abnormal amounts of bleeding, swelling, significant bruising, significant pain, or evidence of infection, please call the office immediately.  FOR ADULT SURGERY PATIENTS: If you need something for pain relief you may take 1 extra strength Tylenol  (acetaminophen ) AND 2 Ibuprofen (200mg  each) together every 4 hours as needed for pain. (do not take these if you are allergic to them or if you have a reason you should not take them.) Typically, you may only need pain medication for 1 to 3 days.        Recommend daily broad spectrum sunscreen SPF 30+ to sun-exposed areas, reapply every 2 hours as needed. Call for new or changing lesions.  Staying in the shade or wearing long sleeves, sun glasses (UVA+UVB protection) and wide brim hats (4-inch brim around the entire circumference of the hat) are also recommended for sun protection.      Melanoma ABCDEs  Melanoma is the most dangerous type of skin cancer, and is the leading cause of death from skin disease.  You are more likely to develop melanoma if you: Have light-colored skin, light-colored eyes, or red  or blond hair Spend a lot of time in the sun Tan regularly, either outdoors or in a tanning bed Have had blistering sunburns, especially during childhood Have a close family member who has had a melanoma Have atypical moles or large birthmarks  Early detection of melanoma is key since treatment is typically straightforward and cure rates are extremely high if we catch it early.   The first sign of melanoma is often a change in a mole or a new dark spot.  The ABCDE system is a way of remembering the signs of melanoma.  A for asymmetry:  The two halves do not match. B for border:  The edges of the growth are irregular. C for color:  A mixture of colors are present instead of an even brown color. D for diameter:  Melanomas are usually (but not always) greater than 6mm - the size of a pencil eraser. E for evolution:  The spot keeps changing in size, shape, and color.  Please check your skin once per month between visits. You can use a small mirror in front and a large mirror behind you to keep an eye on the back side or your body.   If you see any new or changing lesions before your next follow-up, please call to schedule a visit.  Please continue daily skin protection including broad spectrum sunscreen SPF 30+ to sun-exposed areas, reapplying every 2 hours as needed when you're outdoors.  Staying in the shade or wearing long sleeves, sun glasses (UVA+UVB protection) and wide brim hats (4-inch brim around the entire circumference of the hat) are also recommended for sun protection.      Due to recent changes in healthcare laws, you may see results of your pathology and/or laboratory studies on MyChart before the doctors have had a chance to review them. We understand that in some cases there may be results that are confusing or concerning to you. Please understand that not all results are received at the same time and often the doctors may need to interpret multiple results in order to  provide you with the best plan of care or course of treatment. Therefore, we ask that you please give us  2 business days to thoroughly review all your results before contacting the office for clarification. Should we see a critical lab result, you will be contacted sooner.   If You Need Anything After Your Visit  If you have any questions or concerns for your doctor, please call our main line at [phone] and press option 4 to reach your doctor's medical assistant. If no one answers, please leave a voicemail as directed and we will return your call as soon as possible. Messages left after 4 pm will be answered the following business day.   You may also send us  a message via MyChart. We typically respond to MyChart messages within 1-2 business days.  For prescription refills, please ask your pharmacy to contact our office. Our fax number is [phone].  If you have an urgent issue when the clinic is closed that cannot wait until the next business day, you can page your doctor at the number below.    Please note that while we do our best to be available for urgent issues outside of office hours, we are not available 24/7.   If you have an urgent issue and are unable to reach us , you may choose to seek medical care at your doctor's office, retail clinic, urgent care center, or emergency room.  If you have a medical emergency, please immediately call 911 or go to the emergency department.  Pager Numbers  - Dr. Hester: [phone]  - Dr. Jackquline: [phone]  - Dr. Claudene: [phone]   - Dr. Raymund: [phone]  In the event of inclement weather, please call our main line at [phone] for an update on the status of any delays or closures.  Dermatology Medication Tips: Please keep the boxes that topical medications come in in order to help keep track of the instructions about where and how to use these. Pharmacies typically print the medication instructions only on the boxes  and not directly on the medication tubes.   If your medication is too expensive, please contact our office at [phone] option 4 or send us  a message through MyChart.   We are unable to tell what your co-pay for medications will be in advance as this is different depending on your insurance coverage. However, we may be able to find a substitute medication at lower cost or fill out paperwork to get insurance to cover a needed medication.   If a prior authorization is required to get your medication covered by your insurance company, please allow us  1-2 business days to complete this process.  Drug prices often vary depending on where the prescription is filled and some pharmacies may offer cheaper prices.  The website www.goodrx.com contains coupons for medications through different pharmacies. The prices here do not account for  what the cost may be with help from insurance (it may be cheaper with your insurance), but the website can give you the price if you did not use any insurance.  - You can print the associated coupon and take it with your prescription to the pharmacy.  - You may also stop by our office during regular business hours and pick up a GoodRx coupon card.  - If you need your prescription sent electronically to a different pharmacy, notify our office through [facility] or by phone at [phone] option 4.     Si Usted Necesita Algo Despus de Su Visita  Tambin puede enviarnos un mensaje a travs de Clinical cytogeneticist. Por lo general respondemos a los mensajes de MyChart en el transcurso de 1 a 2 das hbiles.  Para renovar recetas, por favor pida a su farmacia que se ponga en contacto con nuestra oficina. Randi lakes de fax es [place] [phone].  Si tiene un asunto urgente cuando la clnica est cerrada y que no puede esperar hasta el siguiente da hbil, puede llamar/localizar a su doctor(a) al nmero que aparece a continuacin.   Por favor, tenga en cuenta que aunque  hacemos todo lo posible para estar disponibles para asuntos urgentes fuera del horario de [place], no estamos disponibles las 24 horas del da, los 7 [address]  [address] de la [place].   Si tiene un problema urgente y no puede comunicarse con nosotros, puede optar por buscar atencin mdica  en el consultorio de su doctor(a), en una clnica privada, en un centro de atencin urgente o en una sala de emergencias.  Si tiene Engineer, drilling, por favor llame inmediatamente al 911 o vaya a la sala de emergencias.  Nmeros de bper  - Dr. Hester: [phone]  - Dra. Jackquline: [phone]  - Dr. Claudene: [phone]  - Dra. Kitts: [phone]  En caso de inclemencias del [place], por favor llame a nuestra lnea principal al [phone] para una actualizacin sobre el estado de cualquier retraso o cierre.  Consejos para la medicacin en dermatologa: Por favor, guarde las cajas en las que vienen los medicamentos de uso tpico para ayudarle a seguir las instrucciones sobre dnde y cmo usarlos. Las farmacias generalmente imprimen las instrucciones del medicamento slo en las cajas y no directamente en los tubos del [place].   Si su medicamento es muy caro, por favor, pngase en contacto con landry rieger llamando al [phone] y presione la opcin 4 o envenos un mensaje a travs de Clinical cytogeneticist.   No podemos decirle cul ser su copago por los medicamentos por adelantado ya que esto es diferente dependiendo de la cobertura de su seguro. Sin embargo, es posible que podamos encontrar un medicamento sustituto a Audiological scientist un formulario para que el seguro cubra el medicamento que se considera necesario.   Si se requiere una autorizacin previa para que su compaa de seguros malta su medicamento, por favor permtanos de 1 a 2 das hbiles para completar este proceso.  Los precios de los medicamentos varan con frecuencia dependiendo del Environmental consultant de dnde se surte la receta y alguna farmacias pueden  ofrecer precios ms baratos.  El sitio web www.goodrx.com tiene cupones para medicamentos de Health and safety inspector. Los precios aqu no tienen en cuenta lo que podra costar con la ayuda del seguro (puede ser ms barato con su seguro), pero el sitio web puede darle el precio si no utiliz Tourist information centre manager.  - Puede imprimir el cupn correspondiente y llevarlo con su receta a la  farmacia.  - Tambin puede pasar por nuestra oficina durante el horario de atencin regular y Education officer, museum una tarjeta de cupones de GoodRx.  - Si necesita que su receta se enve electrnicamente a una farmacia diferente, informe a nuestra oficina a travs de MyChart de [place] o por telfono llamando al [phone] y presione la opcin 4.

## 2023-09-18 NOTE — Progress Notes (Signed)
 Follow-Up Visit   Subjective  Zachary Guerrero is a 78 y.o. male who presents for the following: Skin Cancer Screening and Full Body Skin Exam. Hx of AKs. Hx of SCCis.   Areas on scalp, face, neck. Itching,   SCCis. Right lower leg. Bx: 08/13/2023. Treated with 5FU/Calcipotriene twice a day for 7 days as directed. Finished ~4 weeks ago.   The patient presents for Total-Body Skin Exam (TBSE) for skin cancer screening and mole check. The patient has spots, moles and lesions to be evaluated, some may be new or changing and the patient may have concern these could be cancer.  The following portions of the chart were reviewed this encounter and updated as appropriate: medications, allergies, medical history  Review of Systems:  No other skin or systemic complaints except as noted in HPI or Assessment and Plan.  Objective  Well appearing patient in no apparent distress; mood and affect are within normal limits.  A full examination was performed including scalp, head, eyes, ears, nose, lips, neck, chest, axillae, abdomen, back, buttocks, bilateral upper extremities, bilateral lower extremities, hands, feet, fingers, toes, fingernails, and toenails. All findings within normal limits unless otherwise noted below.   Relevant physical exam findings are noted in the Assessment and Plan.  Scalp x2, L ear helix x1 (3) Erythematous thin papules/macules with gritty scale.  Scalp, face, R forearm x23 (23) Erythematous keratotic or waxy stuck-on papule or plaque. Right lower pretibial superior 2.5 cm erythematous keratotic plaque   Right Lower Pretibial Inferior 1.5 cm erythematous scaly nodule    Assessment & Plan   SKIN CANCER SCREENING PERFORMED TODAY.  HISTORY OF SQUAMOUS CELL CARCINOMA IN SITU OF THE SKIN - No evidence of recurrence today, crusted healing plaque - Recommend regular full body skin exams - Recommend daily broad spectrum sunscreen SPF 30+ to sun-exposed areas, reapply every  2 hours as needed.  - Call if any new or changing lesions are noted between office visits   ACTINIC DAMAGE - Chronic condition, secondary to cumulative UV/sun exposure - diffuse scaly erythematous macules with underlying dyspigmentation - Recommend daily broad spectrum sunscreen SPF 30+ to sun-exposed areas, reapply every 2 hours as needed.  - Staying in the shade or wearing long sleeves, sun glasses (UVA+UVB protection) and wide brim hats (4-inch brim around the entire circumference of the hat) are also recommended for sun protection.  - Call for new or changing lesions.  LENTIGINES, SEBORRHEIC KERATOSES, HEMANGIOMAS - Benign normal skin lesions - Benign-appearing - Call for any changes  MELANOCYTIC NEVI - Tan-brown and/or pink-flesh-colored symmetric macules and papules - Benign appearing on exam today - Observation - Call clinic for new or changing moles - Recommend daily use of broad spectrum spf 30+ sunscreen to sun-exposed areas.   Purpura - Chronic; persistent and recurrent.  Treatable, but not curable. 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 Condition is bothersome/symptomatic for patient. Currently flared. Rosacea is a chronic progressive skin condition usually affecting the face of adults, causing redness and/or acne bumps. It is treatable but not curable. It sometimes affects the eyes (ocular rosacea) as well.  It may respond to topical and/or systemic medication and can flare with stress, sun exposure, alcohol, exercise, topical steroids (including hydrocortisone/cortisone 10)  and some foods.  Daily application of broad spectrum spf 30+ sunscreen to face is recommended to reduce flares.  Patient denies grittiness of the eyes  Treatment Plan Will prescribe [facility] metronidazole /ivermectin/azelaic acid twice daily as needed to affected areas on the face. The patient was advised this is not covered by insurance since it is made by a compounding pharmacy. They will receive an email to check out and the medication will be mailed to their home.     TINEA PEDIS Exam: Scaling and maceration web spaces and over distal and lateral soles. Chronic and persistent condition with duration or expected duration over one year. Condition is symptomatic / bothersome to patient. Not to goal. Treatment Plan: Start Ketoconazole  2% cream nightly to feet at least one month, repeat as needed.    AK (ACTINIC KERATOSIS) (3) Scalp x2, L ear helix x1 (3) Actinic keratoses are precancerous spots that appear secondary to cumulative UV radiation exposure/sun exposure over time. They are chronic with expected duration over 1 year. A portion of actinic keratoses will progress to squamous cell carcinoma of the skin. It is not possible to reliably predict which spots will progress to skin cancer and so treatment is recommended to prevent development of skin cancer.  Recommend daily broad spectrum sunscreen SPF 30+ to sun-exposed areas, reapply every 2 hours as needed.  Recommend staying in the shade or wearing long sleeves, sun glasses (UVA+UVB protection) and wide brim hats (4-inch brim around the entire circumference of the hat). Call for new or changing lesions. Destruction of lesion - Scalp x2, L ear helix x1 (3) Complexity: simple   Destruction method: cryotherapy   Informed consent: discussed and consent obtained   Timeout:  patient name, date of birth, surgical site, and procedure verified Lesion destroyed using liquid nitrogen: Yes   Region frozen until ice ball extended beyond  lesion: Yes   Outcome: patient tolerated procedure well with no complications   Post-procedure details: wound care instructions given   Additional details:  Prior to procedure, discussed risks of blister formation, small wound, skin dyspigmentation, or rare scar following cryotherapy. Recommend Vaseline ointment to treated areas while healing.   INFLAMED SEBORRHEIC KERATOSIS (23) Scalp, face, R forearm x23 (23) Symptomatic, irritating, patient would like treated. Destruction of lesion - Scalp, face, R forearm x23 (23) Complexity: simple   Destruction method: cryotherapy   Informed consent: discussed and consent obtained   Timeout:  patient name, date of birth, surgical site, and procedure verified Lesion destroyed using liquid nitrogen: Yes   Region frozen until ice ball extended beyond lesion: Yes   Outcome: patient tolerated procedure well with no complications   Post-procedure details: wound care instructions given   Additional details:  Prior to procedure, discussed risks of blister formation, small wound, skin dyspigmentation, or rare scar following cryotherapy. Recommend Vaseline ointment to treated areas while healing.   NEOPLASM OF SKIN (2) Right lower pretibial superior Epidermal / dermal shaving  Lesion diameter (cm):  2.5 Informed consent: discussed and consent obtained   Timeout: patient name, date of birth, surgical site, and procedure verified   Procedure prep:  Patient was prepped and draped in usual sterile fashion Prep type:  Isopropyl alcohol Anesthesia: the lesion was anesthetized in a standard fashion   Anesthetic:  1% lidocaine  w/ epinephrine  1-100,000 buffered w/ 8.4% NaHCO3 Instrument used: DermaBlade  Hemostasis achieved with: pressure, aluminum chloride and electrodesiccation   Outcome: patient tolerated procedure well   Post-procedure details: sterile dressing applied and wound care instructions given   Dressing type: bandage and petrolatum     Destruction of lesion Complexity: extensive   Destruction method: electrodesiccation and curettage   Informed consent: discussed and consent obtained   Timeout:  patient name, date of birth, surgical site, and procedure verified Procedure prep:  Patient was prepped and draped in usual sterile fashion Prep type:  Isopropyl alcohol Anesthesia: the lesion was anesthetized in a standard fashion   Anesthetic:  1% lidocaine  w/ epinephrine  1-100,000 buffered w/ 8.4% NaHCO3 Curettage performed in three different directions: Yes   Electrodesiccation performed over the curetted area: Yes   Curettage cycles:  3 Final wound size (cm):  2.5 Hemostasis achieved with:  pressure and aluminum chloride Outcome: patient tolerated procedure well with no complications   Post-procedure details: sterile dressing applied and wound care instructions given   Dressing type: bandage and petrolatum    Specimen 1 - Surgical pathology Differential Diagnosis: R/O residual SCCis  Check Margins: No  Previous pathology: [id number] 2 pieces in bottle  EDC today Right Lower Pretibial Inferior Epidermal / dermal shaving  Lesion diameter (cm):  1.5 Informed consent: discussed and consent obtained   Timeout: patient name, date of birth, surgical site, and procedure verified   Procedure prep:  Patient was prepped and draped in usual sterile fashion Prep type:  Isopropyl alcohol Anesthesia: the lesion was anesthetized in a standard fashion   Anesthetic:  1% lidocaine  w/ epinephrine  1-100,000 buffered w/ 8.4% NaHCO3 Instrument used: DermaBlade   Hemostasis achieved with: pressure, aluminum chloride and electrodesiccation   Outcome: patient tolerated procedure well   Post-procedure details: sterile dressing applied and wound care instructions given   Dressing type: bandage and petrolatum    Destruction of lesion Complexity: extensive   Destruction method: electrodesiccation and curettage   Informed consent:  discussed and consent obtained   Timeout:  patient name, date of birth, surgical site, and procedure verified Procedure prep:  Patient was prepped and draped in usual sterile fashion Prep type:  Isopropyl alcohol Anesthesia: the lesion was anesthetized in a standard fashion   Anesthetic:  1% lidocaine  w/ epinephrine  1-100,000 buffered w/ 8.4% NaHCO3 Curettage performed in three different directions: Yes   Electrodesiccation performed over the curetted area: Yes   Curettage cycles:  3 Final wound size (cm):  1.5 Hemostasis achieved with:  pressure and aluminum chloride Outcome: patient tolerated procedure well with no complications   Post-procedure details: sterile dressing applied and wound care instructions given   Dressing type: bandage and petrolatum    Specimen 2 - Surgical pathology Differential Diagnosis: R/O SCC  Check Margins: No  EDC today Plan to use 5FU/Calcipotriene pending pathology results.  SKIN CANCER SCREENING   HISTORY OF SQUAMOUS CELL CARCINOMA   ACTINIC SKIN DAMAGE   LENTIGO   MELANOCYTIC NEVUS, UNSPECIFIED LOCATION   ROSACEA   COUNSELING AND COORDINATION OF CARE   MEDICATION MANAGEMENT   TINEA PEDIS OF BOTH FEET   Return for AK Follow Up, Biopsy Follow Up in 4-5 months.  I, Jill Parcell, CMA, am acting as scribe for Alm Rhyme, MD.   Documentation: I have reviewed the above documentation for accuracy and completeness, and I agree with the above.  Alm Rhyme, MD

## 2023-09-19 LAB — SURGICAL PATHOLOGY

## 2023-09-23 ENCOUNTER — Ambulatory Visit: Payer: Self-pay | Admitting: Dermatology

## 2023-09-23 NOTE — Telephone Encounter (Addendum)
 Patient and wife notified of results. Verbalized understanding denied further questions. Will recheck areas at next visit. ----- Message from Alm Rhyme sent at 09/23/2023  4:14 PM EDT ----- FINAL DIAGNOSIS        1. Skin, right lower pretibial superior :       SEBORRHEIC KERATOSIS, INFLAMED        2. Skin, right lower pretibial inferior :       SEBORRHEIC KERATOSIS, INFLAMED   1&2 - Both benign keratosis, inflamed Both already treated Recheck next visit ----- Message ----- From: Interface, Lab In Three Zero One Sent: 09/19/2023   6:28 PM EDT To: Alm JAYSON Rhyme, MD

## 2023-10-24 ENCOUNTER — Telehealth: Payer: Self-pay

## 2023-10-24 NOTE — Telephone Encounter (Signed)
 Patient called LM on VM, wound on his legs need to be checked.  I called patient LM on VM returning his call please call back

## 2023-10-24 NOTE — Telephone Encounter (Signed)
 Called patient back regarding a phone call he left on voice mail, Patient states that he is concerned that area that was previous biopsied at right lower pretibial superior is infected. He states he doesn't feel area is healing and is red and swollen. He think he may have allergy to mupirocin . He is currently using Desenex 2 % on area. It is very red, swollen and bleeds easily when rubbed. He also states he noticed a new bump forming above this wound.  Have consulted with Dr. Raymund to advise

## 2023-10-24 NOTE — Telephone Encounter (Signed)
 Patient of Dr. Hester calling d/t non healing biopsy site. 2 biopsies done 8/20 showing iSK. States superior lesion has not healed since biopsy. Notes redness around it. Denies any drainage, spreading redness, fevers, chills. He is using cortisone cream daily. Thinks he is allergic to mupirocin  and does not want to use that. Unable to send photo.   Advised low c/f infection. Recommended stopping cortisone cream and just use vaseline. Will get in patient next week for assessment. Advised to go to ED/Urgent Care over weekend if any worsening sx.

## 2023-10-28 ENCOUNTER — Ambulatory Visit: Admitting: Dermatology

## 2023-10-28 DIAGNOSIS — D692 Other nonthrombocytopenic purpura: Secondary | ICD-10-CM | POA: Diagnosis not present

## 2023-10-28 DIAGNOSIS — L578 Other skin changes due to chronic exposure to nonionizing radiation: Secondary | ICD-10-CM | POA: Diagnosis not present

## 2023-10-28 DIAGNOSIS — R234 Changes in skin texture: Secondary | ICD-10-CM

## 2023-10-28 DIAGNOSIS — W908XXA Exposure to other nonionizing radiation, initial encounter: Secondary | ICD-10-CM

## 2023-10-28 DIAGNOSIS — Z7189 Other specified counseling: Secondary | ICD-10-CM

## 2023-10-28 NOTE — Progress Notes (Addendum)
   Follow-Up Visit   Subjective  Zachary Guerrero is a 78 y.o. male who presents for the following: Patient was seen on 09/18/23 an had biopsy done at right lower pretibia (biopsy proven ISK). Patient reports not healing, patient reports he is allergic to mupirocin  ointment and thinks he is also allergic to Band-Aid, reports bleeding easily. No drainage, no fevers, last weeks there was swelling present.   The following portions of the chart were reviewed this encounter and updated as appropriate: medications, allergies, medical history  Review of Systems:  No other skin or systemic complaints except as noted in HPI or Assessment and Plan.  Objective  Well appearing patient in no apparent distress; mood and affect are within normal limits.  A focused examination was performed of the following areas: Right pretibia   Relevant exam findings are noted in the Assessment and Plan.         Assessment & Plan   HEALING BIOPSY SITE Bx proven on 09/18/23 ISK at right pretibia. Photo taken today.  Exam: Scab present Treatment Plan: Patient advised longer healing from knee down.  Mechanical debridement 2 squared centimeters with forceps and gauze abrasion of crust performed.  Area under scab/crust was healed / re-epithelialized skin. No evidence of infection. Patient with noted allergy to mupirocin  ointment. Area cleansed with puracyn spray, no bandage applied.   ACTINIC DAMAGE - chronic, secondary to cumulative UV radiation exposure/sun exposure over time - diffuse scaly erythematous macules with underlying dyspigmentation - Recommend daily broad spectrum sunscreen SPF 30+ to sun-exposed areas, reapply every 2 hours as needed.  - Recommend staying in the shade or wearing long sleeves, sun glasses (UVA+UVB protection) and wide brim hats (4-inch brim around the entire circumference of the hat). - Call for new or changing lesions.  Purpura - Chronic; persistent and recurrent.  Treatable,  but not curable. - Violaceous macules and patches. Photos taken today.  - Benign - Related to trauma, age, sun damage and/or use of blood thinners, chronic use of topical and/or oral steroids - Observe - Can use OTC arnica containing moisturizer such as Dermend Bruise Formula if desired - Call for worsening or other concerns -Discussed referral to vascular surgeon for consult, patient declined at this time.   CRUSTED SERUM ON SKIN   COUNSELING AND COORDINATION OF CARE   ACTINIC SKIN DAMAGE   PURPURA    Return for As scheduled, w/ Dr. Hester.  I, Jacquelynn V. Wilfred, CMA, am acting as scribe for Alm Hester, MD .   Documentation: I have reviewed the above documentation for accuracy and completeness, and I agree with the above.  Alm Hester, MD

## 2023-10-28 NOTE — Patient Instructions (Addendum)
 OTC arnica containing moisturizer such as Dermend Bruise Formula if desired. (For bruising)  Due to recent changes in healthcare laws, you may see results of your pathology and/or laboratory studies on MyChart before the doctors have had a chance to review them. We understand that in some cases there may be results that are confusing or concerning to you. Please understand that not all results are received at the same time and often the doctors may need to interpret multiple results in order to provide you with the best plan of care or course of treatment. Therefore, we ask that you please give us  2 business days to thoroughly review all your results before contacting the office for clarification. Should we see a critical lab result, you will be contacted sooner.   If You Need Anything After Your Visit  If you have any questions or concerns for your doctor, please call our main line at [phone] and press option 4 to reach your doctor's medical assistant. If no one answers, please leave a voicemail as directed and we will return your call as soon as possible. Messages left after 4 pm will be answered the following business day.   You may also send us  a message via MyChart. We typically respond to MyChart messages within 1-2 business days.  For prescription refills, please ask your pharmacy to contact our office. Our fax number is [phone].  If you have an urgent issue when the clinic is closed that cannot wait until the next business day, you can page your doctor at the number below.    Please note that while we do our best to be available for urgent issues outside of office hours, we are not available 24/7.   If you have an urgent issue and are unable to reach us , you may choose to seek medical care at your doctor's office, retail clinic, urgent care center, or emergency room.  If you have a medical emergency, please immediately call 911 or go to the emergency department.  Pager  Numbers  - Dr. Hester: [phone]  - Dr. Jackquline: [phone]  - Dr. Claudene: [phone]   In the event of inclement weather, please call our main line at [phone] for an update on the status of any delays or closures.  Dermatology Medication Tips: Please keep the boxes that topical medications come in in order to help keep track of the instructions about where and how to use these. Pharmacies typically print the medication instructions only on the boxes and not directly on the medication tubes.   If your medication is too expensive, please contact our office at [phone] option 4 or send us  a message through MyChart.   We are unable to tell what your co-pay for medications will be in advance as this is different depending on your insurance coverage. However, we may be able to find a substitute medication at lower cost or fill out paperwork to get insurance to cover a needed medication.   If a prior authorization is required to get your medication covered by your insurance company, please allow us  1-2 business days to complete this process.  Drug prices often vary depending on where the prescription is filled and some pharmacies may offer cheaper prices.  The website www.goodrx.com contains coupons for medications through different pharmacies. The prices here do not account for what the cost may be with help from insurance (it may be cheaper with your insurance), but the website can give you the price if you did not  use any insurance.  - You can print the associated coupon and take it with your prescription to the pharmacy.  - You may also stop by our office during regular business hours and pick up a GoodRx coupon card.  - If you need your prescription sent electronically to a different pharmacy, notify our office through [facility] or by phone at [phone] option 4.     Si Usted Necesita Algo Despus de Su Visita  Tambin puede enviarnos un mensaje a travs de  Clinical cytogeneticist. Por lo general respondemos a los mensajes de MyChart en el transcurso de 1 a 2 das hbiles.  Para renovar recetas, por favor pida a su farmacia que se ponga en contacto con nuestra oficina. Randi lakes de fax es [place] [phone].  Si tiene un asunto urgente cuando la clnica est cerrada y que no puede esperar hasta el siguiente da hbil, puede llamar/localizar a su doctor(a) al nmero que aparece a continuacin.   Por favor, tenga en cuenta que aunque hacemos todo lo posible para estar disponibles para asuntos urgentes fuera del horario de [place], no estamos disponibles las 24 horas del da, los 7 [address]  [address] de la [place].   Si tiene un problema urgente y no puede comunicarse con nosotros, puede optar por buscar atencin mdica  en el consultorio de su doctor(a), en una clnica privada, en un centro de atencin urgente o en una sala de emergencias.  Si tiene Engineer, drilling, por favor llame inmediatamente al 911 o vaya a la sala de emergencias.  Nmeros de bper  - Dr. Hester: [phone]  - Dra. Jackquline: [phone]  - Dr. Claudene: [phone]   En caso de inclemencias del tiempo, por favor llame a landry capes principal al [phone] para una actualizacin sobre el [place] de cualquier retraso o cierre.  Consejos para la medicacin en dermatologa: Por favor, guarde las cajas en las que vienen los medicamentos de uso tpico para ayudarle a seguir las instrucciones sobre dnde y cmo usarlos. Las farmacias generalmente imprimen las instrucciones del medicamento slo en las cajas y no directamente en los tubos del [place].   Si su medicamento es muy caro, por favor, pngase en contacto con landry rieger llamando al [phone] y presione la opcin 4 o envenos un mensaje a travs de Clinical cytogeneticist.   No podemos decirle cul ser su copago por los medicamentos por adelantado ya que esto es diferente dependiendo de la cobertura de su seguro. Sin embargo, es posible que  podamos encontrar un medicamento sustituto a Audiological scientist un formulario para que el seguro cubra el medicamento que se considera necesario.   Si se requiere una autorizacin previa para que su compaa de seguros malta su medicamento, por favor permtanos de 1 a 2 das hbiles para completar este proceso.  Los precios de los medicamentos varan con frecuencia dependiendo del Environmental consultant de dnde se surte la receta y alguna farmacias pueden ofrecer precios ms baratos.  El sitio web www.goodrx.com tiene cupones para medicamentos de Health and safety inspector. Los precios aqu no tienen en cuenta lo que podra costar con la ayuda del seguro (puede ser ms barato con su seguro), pero el sitio web puede darle el precio si no utiliz Tourist information centre manager.  - Puede imprimir el cupn correspondiente y llevarlo con su receta a la farmacia.  - Tambin puede pasar por nuestra oficina durante el horario de atencin regular y Education officer, museum una tarjeta de cupones de GoodRx.  - Si necesita que su receta se enve  electrnicamente a ignacia bender diferente, informe a nuestra oficina a travs de MyChart de [place] o por telfono llamando al [phone] y presione la opcin 4.

## 2023-10-29 ENCOUNTER — Encounter: Payer: Self-pay | Admitting: Dermatology

## 2024-02-04 ENCOUNTER — Ambulatory Visit: Admitting: Dermatology

## 2024-02-04 ENCOUNTER — Encounter: Payer: Self-pay | Admitting: Dermatology

## 2024-02-04 DIAGNOSIS — D229 Melanocytic nevi, unspecified: Secondary | ICD-10-CM

## 2024-02-04 DIAGNOSIS — Z8589 Personal history of malignant neoplasm of other organs and systems: Secondary | ICD-10-CM

## 2024-02-04 DIAGNOSIS — Z85828 Personal history of other malignant neoplasm of skin: Secondary | ICD-10-CM

## 2024-02-04 DIAGNOSIS — Z1283 Encounter for screening for malignant neoplasm of skin: Secondary | ICD-10-CM | POA: Diagnosis not present

## 2024-02-04 DIAGNOSIS — L820 Inflamed seborrheic keratosis: Secondary | ICD-10-CM | POA: Diagnosis not present

## 2024-02-04 DIAGNOSIS — L814 Other melanin hyperpigmentation: Secondary | ICD-10-CM | POA: Diagnosis not present

## 2024-02-04 DIAGNOSIS — W908XXA Exposure to other nonionizing radiation, initial encounter: Secondary | ICD-10-CM

## 2024-02-04 DIAGNOSIS — D692 Other nonthrombocytopenic purpura: Secondary | ICD-10-CM

## 2024-02-04 DIAGNOSIS — D1801 Hemangioma of skin and subcutaneous tissue: Secondary | ICD-10-CM

## 2024-02-04 DIAGNOSIS — L821 Other seborrheic keratosis: Secondary | ICD-10-CM

## 2024-02-04 DIAGNOSIS — L578 Other skin changes due to chronic exposure to nonionizing radiation: Secondary | ICD-10-CM | POA: Diagnosis not present

## 2024-02-04 NOTE — Patient Instructions (Addendum)

## 2024-02-04 NOTE — Progress Notes (Signed)
" ° °  Follow-Up Visit   Subjective  Zachary Guerrero is a 79 y.o. male who presents for the following: Skin Cancer Screening and Upper Body Skin Exam, hx of SCC  The patient presents for Upper Body Skin Exam (UBSE) for skin cancer screening and mole check. The patient has spots, moles and lesions to be evaluated, some may be new or changing and the patient may have concern these could be cancer.  The following portions of the chart were reviewed this encounter and updated as appropriate: medications, allergies, medical history  Review of Systems:  No other skin or systemic complaints except as noted in HPI or Assessment and Plan.  Objective  Well appearing patient in no apparent distress; mood and affect are within normal limits.  All skin waist up examined. Relevant physical exam findings are noted in the Assessment and Plan.  Scalp, neck x 30 (30) Stuck-on, waxy, tan-brown papule or plaque --Discussed benign etiology and prognosis.   Assessment & Plan  INFLAMED SEBORRHEIC KERATOSIS (30) Scalp, neck x 30 (30) Symptomatic, irritating, patient would like treated.  - Destruction of lesion - Scalp, neck x 30 (30) Complexity: simple   Destruction method: cryotherapy   Informed consent: discussed and consent obtained   Timeout:  patient name, date of birth, surgical site, and procedure verified Lesion destroyed using liquid nitrogen: Yes   Region frozen until ice ball extended beyond lesion: Yes   Outcome: patient tolerated procedure well with no complications   Post-procedure details: wound care instructions given      Skin cancer screening performed today.  Actinic Damage - Chronic condition, secondary to cumulative UV/sun exposure - diffuse scaly erythematous macules with underlying dyspigmentation - Recommend daily broad spectrum sunscreen SPF 30+ to sun-exposed areas, reapply every 2 hours as needed.  - Staying in the shade or wearing long sleeves, sun glasses (UVA+UVB  protection) and wide brim hats (4-inch brim around the entire circumference of the hat) are also recommended for sun protection.  - Call for new or changing lesions.  Lentigines, Seborrheic Keratoses, Hemangiomas - Benign normal skin lesions - Benign-appearing - Call for any changes  Melanocytic Nevi - Tan-brown and/or pink-flesh-colored symmetric macules and papules - Benign appearing on exam today - Observation - Call clinic for new or changing moles - Recommend daily use of broad spectrum spf 30+ sunscreen to sun-exposed areas.   Purpura - Chronic; persistent and recurrent.  Treatable, but not curable. Arms and hands  - Violaceous macules and patches - Benign - Related to trauma, age, sun damage and/or use of blood thinners, chronic use of topical and/or oral steroids - Observe - Can use OTC arnica containing moisturizer such as Dermend Bruise Formula if desired - Call for worsening or other concerns   HISTORY OF SQUAMOUS CELL CARCINOMA OF THE SKIN - No evidence of recurrence today - No lymphadenopathy - Recommend regular full body skin exams - Recommend daily broad spectrum sunscreen SPF 30+ to sun-exposed areas, reapply every 2 hours as needed.  - Call if any new or changing lesions are noted between office visits    Return in about 6 months (around 08/03/2024) for ISK, hx of SCC.  IFay Kirks, CMA, am acting as scribe for Alm Rhyme, MD .   Documentation: I have reviewed the above documentation for accuracy and completeness, and I agree with the above.  Alm Rhyme, MD    "

## 2024-08-12 ENCOUNTER — Ambulatory Visit: Admitting: Dermatology
